# Patient Record
Sex: Male | Born: 1960 | Race: Black or African American | Hispanic: No | Marital: Married | Smoking: Never smoker
Health system: Southern US, Community
[De-identification: ages and names within clinical notes are randomized; demographics above are authoritative.]

## PROBLEM LIST (undated history)

## (undated) DIAGNOSIS — Z8673 Personal history of transient ischemic attack (TIA), and cerebral infarction without residual deficits: Secondary | ICD-10-CM

## (undated) DIAGNOSIS — D860 Sarcoidosis of lung: Secondary | ICD-10-CM

## (undated) DIAGNOSIS — Z87442 Personal history of urinary calculi: Secondary | ICD-10-CM

## (undated) DIAGNOSIS — I253 Aneurysm of heart: Secondary | ICD-10-CM

## (undated) DIAGNOSIS — I10 Essential (primary) hypertension: Secondary | ICD-10-CM

## (undated) DIAGNOSIS — I493 Ventricular premature depolarization: Secondary | ICD-10-CM

## (undated) DIAGNOSIS — I429 Cardiomyopathy, unspecified: Secondary | ICD-10-CM

## (undated) DIAGNOSIS — M1990 Unspecified osteoarthritis, unspecified site: Secondary | ICD-10-CM

## (undated) DIAGNOSIS — R1310 Dysphagia, unspecified: Secondary | ICD-10-CM

## (undated) DIAGNOSIS — D863 Sarcoidosis of skin: Secondary | ICD-10-CM

## (undated) DIAGNOSIS — Z9289 Personal history of other medical treatment: Secondary | ICD-10-CM

## (undated) DIAGNOSIS — R200 Anesthesia of skin: Secondary | ICD-10-CM

## (undated) DIAGNOSIS — Z8489 Family history of other specified conditions: Secondary | ICD-10-CM

## (undated) DIAGNOSIS — G8929 Other chronic pain: Secondary | ICD-10-CM

## (undated) HISTORY — DX: Personal history of other medical treatment: Z92.89

## (undated) HISTORY — PX: KNEE SURGERY: SHX244

## (undated) HISTORY — DX: Unspecified osteoarthritis, unspecified site: M19.90

## (undated) HISTORY — PX: WISDOM TOOTH EXTRACTION: SHX21

## (undated) HISTORY — DX: Ventricular premature depolarization: I49.3

## (undated) HISTORY — DX: Sarcoidosis of skin: D86.3

## (undated) HISTORY — DX: Personal history of transient ischemic attack (TIA), and cerebral infarction without residual deficits: Z86.73

## (undated) HISTORY — DX: Cardiomyopathy, unspecified: I42.9

## (undated) HISTORY — PX: CYSTOSCOPY W/ URETERAL STENT PLACEMENT: SHX1429

---

## 1998-09-15 ENCOUNTER — Encounter: Payer: Self-pay | Admitting: Internal Medicine

## 1998-09-15 ENCOUNTER — Ambulatory Visit (HOSPITAL_COMMUNITY): Admission: RE | Admit: 1998-09-15 | Discharge: 1998-09-15 | Payer: Self-pay | Admitting: Internal Medicine

## 1998-10-18 ENCOUNTER — Ambulatory Visit (HOSPITAL_COMMUNITY): Admission: RE | Admit: 1998-10-18 | Discharge: 1998-10-18 | Payer: Self-pay | Admitting: Internal Medicine

## 1998-10-18 ENCOUNTER — Encounter: Payer: Self-pay | Admitting: Internal Medicine

## 1999-06-08 ENCOUNTER — Emergency Department (HOSPITAL_COMMUNITY): Admission: EM | Admit: 1999-06-08 | Discharge: 1999-06-08 | Payer: Self-pay | Admitting: Emergency Medicine

## 2000-07-10 ENCOUNTER — Ambulatory Visit (HOSPITAL_BASED_OUTPATIENT_CLINIC_OR_DEPARTMENT_OTHER): Admission: RE | Admit: 2000-07-10 | Discharge: 2000-07-10 | Payer: Self-pay | Admitting: General Surgery

## 2000-07-14 ENCOUNTER — Ambulatory Visit (HOSPITAL_COMMUNITY): Admission: RE | Admit: 2000-07-14 | Discharge: 2000-07-14 | Payer: Self-pay | Admitting: General Surgery

## 2000-07-16 ENCOUNTER — Encounter: Payer: Self-pay | Admitting: Family Medicine

## 2000-07-16 ENCOUNTER — Ambulatory Visit (HOSPITAL_COMMUNITY): Admission: RE | Admit: 2000-07-16 | Discharge: 2000-07-16 | Payer: Self-pay | Admitting: Family Medicine

## 2004-08-28 ENCOUNTER — Emergency Department (HOSPITAL_COMMUNITY): Admission: EM | Admit: 2004-08-28 | Discharge: 2004-08-29 | Payer: Self-pay | Admitting: Emergency Medicine

## 2004-08-30 ENCOUNTER — Ambulatory Visit (HOSPITAL_COMMUNITY): Admission: RE | Admit: 2004-08-30 | Discharge: 2004-08-30 | Payer: Self-pay | Admitting: Urology

## 2011-11-04 ENCOUNTER — Emergency Department (HOSPITAL_COMMUNITY)
Admission: EM | Admit: 2011-11-04 | Discharge: 2011-11-04 | Disposition: A | Discharge status: Home / Self Care | Payer: Self-pay | Attending: Emergency Medicine | Admitting: Emergency Medicine

## 2011-11-04 ENCOUNTER — Encounter: Payer: Self-pay | Admitting: *Deleted

## 2011-11-04 ENCOUNTER — Emergency Department (HOSPITAL_COMMUNITY): Payer: Self-pay

## 2011-11-04 DIAGNOSIS — I10 Essential (primary) hypertension: Secondary | ICD-10-CM | POA: Insufficient documentation

## 2011-11-04 DIAGNOSIS — G8929 Other chronic pain: Secondary | ICD-10-CM | POA: Insufficient documentation

## 2011-11-04 DIAGNOSIS — M25452 Effusion, left hip: Secondary | ICD-10-CM

## 2011-11-04 DIAGNOSIS — M169 Osteoarthritis of hip, unspecified: Secondary | ICD-10-CM

## 2011-11-04 DIAGNOSIS — M25459 Effusion, unspecified hip: Secondary | ICD-10-CM | POA: Insufficient documentation

## 2011-11-04 DIAGNOSIS — M1610 Unilateral primary osteoarthritis, unspecified hip: Secondary | ICD-10-CM | POA: Insufficient documentation

## 2011-11-04 HISTORY — DX: Other chronic pain: G89.29

## 2011-11-04 HISTORY — DX: Essential (primary) hypertension: I10

## 2011-11-04 LAB — URINALYSIS, ROUTINE W REFLEX MICROSCOPIC
Glucose, UA: NEGATIVE mg/dL
Ketones, ur: NEGATIVE mg/dL
Leukocytes, UA: NEGATIVE
Nitrite: NEGATIVE
Specific Gravity, Urine: 1.019 (ref 1.005–1.030)
pH: 6.5 (ref 5.0–8.0)

## 2011-11-04 MED ORDER — PREDNISONE (PAK) 10 MG PO TABS: ORAL_TABLET | ORAL | Status: AC

## 2011-11-04 MED ORDER — OXYCODONE-ACETAMINOPHEN 5-325 MG PO TABS: 1.0000 | ORAL_TABLET | ORAL | Status: AC | PRN

## 2011-11-04 MED ORDER — NAPROXEN 500 MG PO TABS: 500.0000 mg | ORAL_TABLET | Freq: Two times a day (BID) | ORAL | Status: DC

## 2011-11-04 MED ORDER — LORAZEPAM 1 MG PO TABS
2.0000 mg | ORAL_TABLET | Freq: Once | ORAL | Status: AC
  Administered 2011-11-04: 2 mg via ORAL
  Filled 2011-11-04 (×2): qty 2

## 2011-11-04 MED ORDER — HYDROCODONE-ACETAMINOPHEN 5-325 MG PO TABS
1.0000 | ORAL_TABLET | Freq: Once | ORAL | Status: AC
  Administered 2011-11-04: 1 via ORAL
  Filled 2011-11-04: qty 1

## 2011-11-04 NOTE — ED Notes (Signed)
Family at bedside. Pt back from xray

## 2011-11-04 NOTE — ED Notes (Signed)
Patient transported to MRI 

## 2011-11-04 NOTE — ED Notes (Signed)
Patient transported to X-ray 

## 2011-11-04 NOTE — ED Provider Notes (Signed)
Medical screening examination/treatment/procedure(s) were performed by non-physician practitioner and as supervising physician I was immediately available for consultation/collaboration.   Andrew King, MD 11/04/11 1535 

## 2011-11-04 NOTE — ED Notes (Signed)
Pt c/o L leg pain, acute exacerbation of chronic condition. Pt denies recent injury or fall.

## 2011-11-04 NOTE — ED Provider Notes (Signed)
History     CSN: [id number] Arrival date & time: 11/04/2011  6:33 AM   First MD Initiated Contact with Patient 11/04/11 0716      HPI Tony Howard is a 50 y.o. male complaining of left hip pain. States in 2004 had a biopsy done where they made an incision in his left inguinal crease. States since then has had intermittent pain that begins in that area and radiates down entire leg. Describes pain as a burning tightening sensation of his entire left leg. States at times his leg will give out and caused him to fall. Also reports he will hear clicking sounds coming from his posterior thigh. States his last fall was approximately a week ago. Within the last week the pain has worsened. Denies back pain, abdominal pain, nausea, vomiting, numbness, tingling, saddle anesthesias,  perineal numbness, or incontinence. Patient is a 50 y.o. male presenting with leg pain.  Leg Pain  Incident onset: Several years. Injury mechanism: Biopsy done from left inguinal crease in 2004. The pain is present in the left hip and left leg. The quality of the pain is described as sharp and burning. The pain is severe. The pain has been intermittent since onset. Pertinent negatives include no numbness, no inability to bear weight, no loss of motion, no muscle weakness, no loss of sensation and no tingling. He reports no foreign bodies present. The symptoms are aggravated by bearing weight.    Past Medical History  Diagnosis Date  . Chronic pain   . Hypertension     Past Surgical History  Procedure Date  . Hip surgery   . Cystoscopy w/ ureteral stent placement     History reviewed. No pertinent family history.  History  Substance Use Topics  . Smoking status: Never Smoker   . Smokeless tobacco: Not on file  . Alcohol Use: No      Review of Systems  Constitutional: Negative for fever and chills.  HENT: Negative for neck pain.   Respiratory: Negative for cough and shortness of breath.   Cardiovascular:  Negative for chest pain and palpitations.  Gastrointestinal: Negative for nausea, vomiting and abdominal pain.  Genitourinary: Negative for dysuria, hematuria and flank pain.  Musculoskeletal: Negative for myalgias, back pain and gait problem.       Positive for Left hip/leg pain.  Denies saddle anesthesias, perineal numbness, bowel incontinence, urinary incontinence  Neurological: Negative for dizziness, tingling, weakness, numbness and headaches.  All other systems reviewed and are negative.    Allergies  Iodine  Home Medications  No current outpatient prescriptions on file.  BP 148/90  Pulse 76  Temp(Src) 97.8 F (36.6 C) (Oral)  Resp 20  SpO2 100%  Physical Exam  Constitutional: He is oriented to person, place, and time. He appears well-developed and well-nourished.  HENT:  Head: Normocephalic and atraumatic.  Eyes: Pupils are equal, round, and reactive to light.  Musculoskeletal:       Left hip: He exhibits tenderness. He exhibits normal range of motion, normal strength, no bony tenderness, no swelling, no crepitus, no deformity and no laceration.       Lumbar back: Normal.       Left hip: Full ROM with mild pain. Pain with palpation over inguinal crease and posterior distal thigh. Femoral, popiteal , and pedal pulses +1. No skin changes, masses, deformity or laceration.   Neurological: He is alert and oriented to person, place, and time.  Skin: Skin is warm and dry. No rash noted.  No erythema. No pallor.  Psychiatric: He has a normal mood and affect. His behavior is normal.    ED Course  Procedures    Results for orders placed during the hospital encounter of 11/04/11  URINALYSIS, ROUTINE W REFLEX MICROSCOPIC      Component Value Range   Color, Urine YELLOW  YELLOW    Appearance CLEAR  CLEAR    Specific Gravity, Urine 1.019  1.005 - 1.030    pH 6.5  5.0 - 8.0    Glucose, UA NEGATIVE  NEGATIVE (mg/dL)   Hgb urine dipstick NEGATIVE  NEGATIVE    Bilirubin Urine  NEGATIVE  NEGATIVE    Ketones, ur NEGATIVE  NEGATIVE (mg/dL)   Protein, ur NEGATIVE  NEGATIVE (mg/dL)   Urobilinogen, UA 0.2  0.0 - 1.0 (mg/dL)   Nitrite NEGATIVE  NEGATIVE    Leukocytes, UA NEGATIVE  NEGATIVE    Dg Hip Complete Left  11/04/2011  *RADIOLOGY REPORT*  Clinical Data: Fall.  Left hip pain.  LEFT HIP - COMPLETE 2+ VIEW  Comparison: None.  Findings: Prominent osteoarthritis the left hip noted with complete loss of craniocaudal articular space, subcortical sclerosis, and prominent asymmetric spurring.  In addition, there is abnormal appearing linear low signal projecting over the left femoral head, suspicious for underlying femoral head fracture or osteochondral lesion. Acetabular fracture is possible but considered less likely.  On the lateral projection, there is some mild flattening of the femoral head, although this could be from remodeling related to the severe degenerative arthropathy.  IMPRESSION:  1.  Severe and asymmetric degenerative arthropathy of the left hip, also with abnormal linear nondisplaced lucency projecting over the femoral head and acetabulum, suspicious for an underlying nondisplaced femoral head fracture.  This could be further characterized with CT or MRI if clinically warranted.  Original Report Authenticated By: Dellia Cloud, M.D.   Mr Hip Left Wo Contrast  11/04/2011  *RADIOLOGY REPORT*  Clinical Data: Fall.  Left hip pain.  MRI OF THE LEFT HIP WITHOUT CONTRAST  Technique:  Multiplanar, multisequence MR imaging was performed. No intravenous contrast was administered.  Comparison: 11/04/2011 radiographs.  Findings: The patient refused to complete more than three series. The study is diagnostic.  Severe left hip osteoarthritis is present with subchondral sclerosis and edema.  Complete loss of the superior joint space of the left hip.  Acetabular subchondral cystic changes are present.  Degenerative large effusion.  Large femoral head osteophytes are present.   The visceral pelvis appears within normal limits.  The right hip appears normal.  Gluteal musculature appears normal and symmetric bilaterally.  Pelvic rings are intact.  There is no femoral neck fracture. Partially visualized L5-S1 spondylosis.  IMPRESSION: Severe left hip osteoarthritis with large degenerative left hip effusion.  Original Report Authenticated By: Andreas Newport, M.D.     MDM   Will treat with pain medications, NSAIDS and steroids. Referral for PCP and Ortho. Discussed plan with pt who agrees with plan.          Thomasene Lot, [place] 11/04/11 1524

## 2012-03-09 ENCOUNTER — Inpatient Hospital Stay (HOSPITAL_COMMUNITY)
Admission: EM | Admit: 2012-03-09 | Discharge: 2012-03-16 | DRG: 287 | Disposition: A | Discharge status: Home / Self Care | Payer: 59 | Source: Ambulatory Visit | Attending: Internal Medicine | Admitting: Internal Medicine

## 2012-03-09 ENCOUNTER — Other Ambulatory Visit: Payer: Self-pay

## 2012-03-09 ENCOUNTER — Encounter (HOSPITAL_COMMUNITY): Payer: Self-pay | Admitting: Emergency Medicine

## 2012-03-09 DIAGNOSIS — R599 Enlarged lymph nodes, unspecified: Secondary | ICD-10-CM | POA: Diagnosis present

## 2012-03-09 DIAGNOSIS — D860 Sarcoidosis of lung: Secondary | ICD-10-CM

## 2012-03-09 DIAGNOSIS — R791 Abnormal coagulation profile: Secondary | ICD-10-CM | POA: Diagnosis present

## 2012-03-09 DIAGNOSIS — F40298 Other specified phobia: Secondary | ICD-10-CM | POA: Diagnosis present

## 2012-03-09 DIAGNOSIS — I10 Essential (primary) hypertension: Secondary | ICD-10-CM

## 2012-03-09 DIAGNOSIS — J99 Respiratory disorders in diseases classified elsewhere: Secondary | ICD-10-CM | POA: Diagnosis present

## 2012-03-09 DIAGNOSIS — R0989 Other specified symptoms and signs involving the circulatory and respiratory systems: Secondary | ICD-10-CM | POA: Diagnosis present

## 2012-03-09 DIAGNOSIS — M169 Osteoarthritis of hip, unspecified: Secondary | ICD-10-CM | POA: Diagnosis present

## 2012-03-09 DIAGNOSIS — Z8673 Personal history of transient ischemic attack (TIA), and cerebral infarction without residual deficits: Secondary | ICD-10-CM

## 2012-03-09 DIAGNOSIS — R9431 Abnormal electrocardiogram [ECG] [EKG]: Secondary | ICD-10-CM | POA: Diagnosis present

## 2012-03-09 DIAGNOSIS — D869 Sarcoidosis, unspecified: Secondary | ICD-10-CM

## 2012-03-09 DIAGNOSIS — R079 Chest pain, unspecified: Secondary | ICD-10-CM

## 2012-03-09 DIAGNOSIS — Z9119 Patient's noncompliance with other medical treatment and regimen: Secondary | ICD-10-CM

## 2012-03-09 DIAGNOSIS — M1710 Unilateral primary osteoarthritis, unspecified knee: Secondary | ICD-10-CM | POA: Diagnosis present

## 2012-03-09 DIAGNOSIS — R0609 Other forms of dyspnea: Secondary | ICD-10-CM

## 2012-03-09 DIAGNOSIS — R0789 Other chest pain: Principal | ICD-10-CM | POA: Diagnosis present

## 2012-03-09 DIAGNOSIS — Z91199 Patient's noncompliance with other medical treatment and regimen due to unspecified reason: Secondary | ICD-10-CM

## 2012-03-09 DIAGNOSIS — M1610 Unilateral primary osteoarthritis, unspecified hip: Secondary | ICD-10-CM | POA: Diagnosis present

## 2012-03-09 DIAGNOSIS — I253 Aneurysm of heart: Secondary | ICD-10-CM | POA: Diagnosis present

## 2012-03-09 DIAGNOSIS — R9439 Abnormal result of other cardiovascular function study: Secondary | ICD-10-CM | POA: Diagnosis present

## 2012-03-09 DIAGNOSIS — D696 Thrombocytopenia, unspecified: Secondary | ICD-10-CM | POA: Diagnosis present

## 2012-03-09 HISTORY — DX: Sarcoidosis of lung: D86.0

## 2012-03-09 HISTORY — DX: Unspecified osteoarthritis, unspecified site: M19.90

## 2012-03-09 NOTE — ED Notes (Signed)
Pt states his wife took his blood pressure tonight and it was elevated  Wife states she took his blood pressure tonight because he was c/o pain on his right side, swelling to his right foot and leg, swollen arm and hand on the left, shortness of breath and some chest pain  Sxs started about 3 days ago

## 2012-03-10 ENCOUNTER — Emergency Department (HOSPITAL_COMMUNITY): Payer: Self-pay

## 2012-03-10 ENCOUNTER — Inpatient Hospital Stay (HOSPITAL_COMMUNITY): Payer: Self-pay

## 2012-03-10 ENCOUNTER — Encounter (HOSPITAL_COMMUNITY): Payer: Self-pay | Admitting: Internal Medicine

## 2012-03-10 DIAGNOSIS — M7989 Other specified soft tissue disorders: Secondary | ICD-10-CM

## 2012-03-10 DIAGNOSIS — R079 Chest pain, unspecified: Secondary | ICD-10-CM | POA: Diagnosis present

## 2012-03-10 DIAGNOSIS — D869 Sarcoidosis, unspecified: Secondary | ICD-10-CM | POA: Diagnosis present

## 2012-03-10 DIAGNOSIS — Z9119 Patient's noncompliance with other medical treatment and regimen: Secondary | ICD-10-CM

## 2012-03-10 DIAGNOSIS — I10 Essential (primary) hypertension: Secondary | ICD-10-CM | POA: Diagnosis present

## 2012-03-10 LAB — POCT I-STAT, CHEM 8
BUN: 24 mg/dL — ABNORMAL HIGH (ref 6–23)
Calcium, Ion: 1.25 mmol/L (ref 1.12–1.32)
Chloride: 111 mEq/L (ref 96–112)
Creatinine, Ser: 1.1 mg/dL (ref 0.50–1.35)
Glucose, Bld: 90 mg/dL (ref 70–99)
HCT: 44 % (ref 39.0–52.0)
Hemoglobin: 15 g/dL (ref 13.0–17.0)
Potassium: 3.9 mEq/L (ref 3.5–5.1)
Sodium: 143 mEq/L (ref 135–145)
TCO2: 23 mmol/L (ref 0–100)

## 2012-03-10 LAB — HEPATIC FUNCTION PANEL
AST: 27 U/L (ref 0–37)
Albumin: 3.2 g/dL — ABNORMAL LOW (ref 3.5–5.2)
Bilirubin, Direct: <0.1 mg/dL (ref 0.0–0.3)
Total Bilirubin: 0.5 mg/dL (ref 0.3–1.2)

## 2012-03-10 LAB — URINALYSIS, ROUTINE W REFLEX MICROSCOPIC
Glucose, UA: NEGATIVE mg/dL
Leukocytes, UA: NEGATIVE
Protein, ur: NEGATIVE mg/dL
Specific Gravity, Urine: 1.026 (ref 1.005–1.030)
Urobilinogen, UA: 1 mg/dL (ref 0.0–1.0)

## 2012-03-10 LAB — CBC
HCT: 42.6 % (ref 39.0–52.0)
Hemoglobin: 14.5 g/dL (ref 13.0–17.0)
Hemoglobin: 14.7 g/dL (ref 13.0–17.0)
MCHC: 34 g/dL (ref 30.0–36.0)
MCHC: 34 g/dL (ref 30.0–36.0)
Platelets: 141 10*3/uL — ABNORMAL LOW (ref 150–400)
RBC: 4.81 MIL/uL (ref 4.22–5.81)
WBC: 5.1 10*3/uL (ref 4.0–10.5)

## 2012-03-10 LAB — CARDIAC PANEL(CRET KIN+CKTOT+MB+TROPI)
Relative Index: 2.9 — ABNORMAL HIGH (ref 0.0–2.5)
Total CK: 120 U/L (ref 7–232)
Total CK: 104 U/L (ref 7–232)
Troponin I: <0.3 ng/mL (ref <0.30)

## 2012-03-10 LAB — TSH: TSH: 1.929 u[IU]/mL (ref 0.350–4.500)

## 2012-03-10 LAB — POCT I-STAT TROPONIN I: Troponin i, poc: 0.01 ng/mL (ref 0.00–0.08)

## 2012-03-10 LAB — CREATININE, SERUM
Creatinine, Ser: 1.05 mg/dL (ref 0.50–1.35)
GFR calc non Af Amer: 81 mL/min — ABNORMAL LOW (ref >90)

## 2012-03-10 LAB — LIPASE, BLOOD: Lipase: 39 U/L (ref 11–59)

## 2012-03-10 MED ORDER — SODIUM CHLORIDE 0.9 % IJ SOLN
3.0000 mL | Freq: Two times a day (BID) | INTRAMUSCULAR | Status: DC
  Administered 2012-03-11 – 2012-03-15 (×3): 3 mL via INTRAVENOUS

## 2012-03-10 MED ORDER — PREDNISONE 50 MG PO TABS
60.0000 mg | ORAL_TABLET | Freq: Every day | ORAL | Status: AC
  Administered 2012-03-10 – 2012-03-12 (×3): 60 mg via ORAL
  Filled 2012-03-10 (×3): qty 1

## 2012-03-10 MED ORDER — METHYLPREDNISOLONE SODIUM SUCC 125 MG IJ SOLR
125.0000 mg | Freq: Once | INTRAMUSCULAR | Status: DC
  Filled 2012-03-10: qty 2

## 2012-03-10 MED ORDER — NITROGLYCERIN IN D5W 200-5 MCG/ML-% IV SOLN
INTRAVENOUS | Status: AC
  Filled 2012-03-10: qty 250

## 2012-03-10 MED ORDER — SODIUM CHLORIDE 0.9 % IV SOLN
Freq: Once | INTRAVENOUS | Status: AC
  Administered 2012-03-10: 02:00:00 via INTRAVENOUS

## 2012-03-10 MED ORDER — SODIUM CHLORIDE 0.9 % IJ SOLN
3.0000 mL | Freq: Two times a day (BID) | INTRAMUSCULAR | Status: DC
  Administered 2012-03-10 – 2012-03-16 (×10): 3 mL via INTRAVENOUS

## 2012-03-10 MED ORDER — HYDRALAZINE HCL 20 MG/ML IJ SOLN
10.0000 mg | Freq: Once | INTRAMUSCULAR | Status: AC
  Administered 2012-03-10: 10 mg via INTRAVENOUS
  Filled 2012-03-10: qty 0.5

## 2012-03-10 MED ORDER — ASPIRIN EC 325 MG PO TBEC
325.0000 mg | DELAYED_RELEASE_TABLET | Freq: Every day | ORAL | Status: DC
  Administered 2012-03-10 – 2012-03-12 (×3): 325 mg via ORAL
  Filled 2012-03-10 (×4): qty 1

## 2012-03-10 MED ORDER — ENOXAPARIN SODIUM 40 MG/0.4ML ~~LOC~~ SOLN
40.0000 mg | SUBCUTANEOUS | Status: DC
  Administered 2012-03-10 – 2012-03-16 (×5): 40 mg via SUBCUTANEOUS
  Filled 2012-03-10 (×8): qty 0.4

## 2012-03-10 MED ORDER — TECHNETIUM TO 99M ALBUMIN AGGREGATED
3.3000 | Freq: Once | INTRAVENOUS | Status: AC | PRN
  Administered 2012-03-10: 3 via INTRAVENOUS

## 2012-03-10 MED ORDER — DOCUSATE SODIUM 100 MG PO CAPS
100.0000 mg | ORAL_CAPSULE | Freq: Two times a day (BID) | ORAL | Status: DC
  Administered 2012-03-10 – 2012-03-16 (×12): 100 mg via ORAL
  Filled 2012-03-10 (×17): qty 1

## 2012-03-10 MED ORDER — ONDANSETRON HCL 4 MG/2ML IJ SOLN: 4.0000 mg | Freq: Four times a day (QID) | INTRAMUSCULAR | Status: DC | PRN

## 2012-03-10 MED ORDER — MORPHINE SULFATE 2 MG/ML IJ SOLN
2.0000 mg | INTRAMUSCULAR | Status: DC | PRN
  Administered 2012-03-10 – 2012-03-15 (×12): 2 mg via INTRAVENOUS
  Filled 2012-03-10 (×13): qty 1

## 2012-03-10 MED ORDER — ZOLPIDEM TARTRATE 10 MG PO TABS
10.0000 mg | ORAL_TABLET | Freq: Every evening | ORAL | Status: DC | PRN
  Administered 2012-03-10 – 2012-03-15 (×6): 10 mg via ORAL
  Filled 2012-03-10 (×6): qty 1

## 2012-03-10 MED ORDER — METOPROLOL TARTRATE 25 MG PO TABS
25.0000 mg | ORAL_TABLET | Freq: Two times a day (BID) | ORAL | Status: DC
  Administered 2012-03-10 – 2012-03-11 (×3): 25 mg via ORAL
  Filled 2012-03-10 (×6): qty 1

## 2012-03-10 MED ORDER — LORAZEPAM 1 MG PO TABS
1.0000 mg | ORAL_TABLET | Freq: Three times a day (TID) | ORAL | Status: DC | PRN
  Administered 2012-03-10 – 2012-03-12 (×2): 1 mg via ORAL
  Filled 2012-03-10 (×2): qty 1

## 2012-03-10 MED ORDER — SODIUM CHLORIDE 0.9 % IV SOLN
250.0000 mL | INTRAVENOUS | Status: DC | PRN
  Administered 2012-03-10: 250 mL via INTRAVENOUS

## 2012-03-10 MED ORDER — NITROGLYCERIN IN D5W 200-5 MCG/ML-% IV SOLN
2.0000 ug/min | INTRAVENOUS | Status: DC
  Administered 2012-03-10: 5 ug/min via INTRAVENOUS

## 2012-03-10 MED ORDER — ONDANSETRON HCL 4 MG PO TABS
4.0000 mg | ORAL_TABLET | Freq: Four times a day (QID) | ORAL | Status: DC | PRN
  Administered 2012-03-14 – 2012-03-15 (×2): 4 mg via ORAL
  Filled 2012-03-10 (×2): qty 1

## 2012-03-10 MED ORDER — SODIUM CHLORIDE 0.9 % IJ SOLN
3.0000 mL | INTRAMUSCULAR | Status: DC | PRN
  Administered 2012-03-12 – 2012-03-14 (×2): 3 mL via INTRAVENOUS

## 2012-03-10 NOTE — Progress Notes (Signed)
CARE MANAGE MENT UTILIZATION REVIEW NOTE 03/10/2012     Patient:  Tony Howard, Tony Howard   Account Number:  [id number]  Documented by:  Bjorn Loser Maelle Sheaffer   Per Ur Regulation Reviewed for med. necessity/level of care/duration of stay

## 2012-03-10 NOTE — H&P (Signed)
PCP: None   Chief Complaint: Chest pain and shortness of breath.  HPI: Tony Howard is an 51 y.o. male with history of known sarcoidosis diagnosed approximately 7-8 years ago, severe hypertension, noncompliant with medication for at least 2 years, presents to the emergency room with intermittent substernal chest pain for at least to 3 weeks. He had substernal chest tightness with exertion, along with chest pain at rest intermittently radiate to his neck. He has had no nausea, vomiting, epigastric pain, fever, chills, or any diaphoresis. A few years ago he was more active working in [employer], but lately, he has not been. He does not exercise regularly. He did not know his cholesterol level. His wife is a CNA, took his blood pressure, and found it was quite elevated at 190/120. 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 He does experience wheezing as well. Evaluation in emergency room included an EKG which shows ST depression and nonspecific T wave inversions over the precordial leads. He has a normal white count, hemoglobin, renal function tests, electrolytes along with negative cardiac markers. His chest x-ray shows lymphadenopathy consistent with sarcoidosis that is unchanged. Hospitalist was asked to admit him for rule out, for further evaluation and treatment, and to stabilize his hypertension.  Rewiew of Systems:  The patient denies anorexia, fever, weight loss,, vision loss, decreased hearing, hoarseness, , syncope, peripheral edema, balance deficits, hemoptysis, abdominal pain, melena, hematochezia, severe indigestion/heartburn, hematuria, incontinence, genital sores, muscle weakness, suspicious skin lesions, transient blindness, difficulty walking, depression, unusual weight change, abnormal bleeding, enlarged lymph nodes, angioedema, and breast masses.   Past Medical History  Diagnosis Date  . Chronic pain   . Hypertension   . Osteoarthritis   . Sarcoidosis of lung      Past Surgical History  Procedure Date  . Hip surgery   . Cystoscopy w/ ureteral stent placement     Medications:  HOME MEDS: Prior to Admission medications   Medication Sig Start Date End Date Taking? Authorizing Provider  naproxen (NAPROSYN) 500 MG tablet Take 1 tablet (500 mg total) by mouth 2 (two) times daily. 11/04/11 11/03/12  Thomasene Lot, PA-C     Allergies:  Allergies  Allergen Reactions  . Naprosyn (Naproxen) Hives  . Iodine Hives and Rash    shellfish    Social History:   reports that he has never smoked. He does not have any smokeless tobacco history on file. He reports that he does not drink alcohol or use illicit drugs.  Family History: Family History  Problem Relation Age of Onset  . Cancer Mother   . Multiple sclerosis Sister      Physical Exam: Filed Vitals:   03/10/12 0045 03/10/12 0416 03/10/12 0421 03/10/12 0525  BP: 172/102 170/119 160/97 160/109  Pulse: 55 57    Temp:      TempSrc:      Resp: 13     SpO2: 100% 98%     Blood pressure 160/109, pulse 57, temperature 98 F (36.7 C), temperature source Oral, resp. rate 13, SpO2 98.00%.  GEN:  Pleasant  person lying in the stretcher in no acute distress; cooperative with exam PSYCH:  alert and oriented x4; does not appear anxious or depressed; affect is appropriate. HEENT: Mucous membranes pink and anicteric; PERRLA; EOM intact; no cervical lymphadenopathy nor thyromegaly or carotid bruit; no JVD; Breasts:: Not examined CHEST WALL: No tenderness CHEST: Normal respiration, he does have expiratory wheezes bilaterally HEART: Regular rate and rhythm; no murmurs rubs or  gallops BACK: No kyphosis or scoliosis; no CVA tenderness ABDOMEN: Obese, soft non-tender; no masses, no organomegaly, normal abdominal bowel sounds; no pannus; no intertriginous candida. Rectal Exam: Not done EXTREMITIES: No bone or joint deformity; age-appropriate arthropathy of the hands and knees; he has 1+ bilateral  pitting edema; no ulcerations. Genitalia: not examined PULSES: 2+ and symmetric SKIN: Normal hydration no rash or ulceration. He has skin patches consistent with sarcoid.  CNS: Cranial nerves 2-12 grossly intact no focal lateralizing neurologic deficit   Labs & Imaging Results for orders placed during the hospital encounter of 03/09/12 (from the past 48 hour(s))  CBC     Status: Normal   Collection Time   03/10/12 12:05 AM      Component Value Range Comment   WBC 5.1  4.0 - 10.5 (K/uL)    RBC 4.77  4.22 - 5.81 (MIL/uL)    Hemoglobin 14.5  13.0 - 17.0 (g/dL)    HCT 40.9  81.1 - 91.4 (%)    MCV 89.3  78.0 - 100.0 (fL)    MCH 30.4  26.0 - 34.0 (pg)    MCHC 34.0  30.0 - 36.0 (g/dL)    RDW 78.2  95.6 - 21.3 (%)    Platelets 155  150 - 400 (K/uL)   POCT I-STAT TROPONIN I     Status: Normal   Collection Time   03/10/12  1:02 AM      Component Value Range Comment   Troponin i, poc 0.01  0.00 - 0.08 (ng/mL)    Comment 3            POCT I-STAT, CHEM 8     Status: Abnormal   Collection Time   03/10/12  1:03 AM      Component Value Range Comment   Sodium 143  135 - 145 (mEq/L)    Potassium 3.9  3.5 - 5.1 (mEq/L)    Chloride 111  96 - 112 (mEq/L)    BUN 24 (*) 6 - 23 (mg/dL)    Creatinine, Ser 0.86  0.50 - 1.35 (mg/dL)    Glucose, Bld 90  70 - 99 (mg/dL)    Calcium, Ion 5.78  1.12 - 1.32 (mmol/L)    TCO2 23  0 - 100 (mmol/L)    Hemoglobin 15.0  13.0 - 17.0 (g/dL)    HCT 46.9  62.9 - 52.8 (%)   URINALYSIS, ROUTINE W REFLEX MICROSCOPIC     Status: Normal   Collection Time   03/10/12  1:39 AM      Component Value Range Comment   Color, Urine YELLOW  YELLOW     APPearance CLEAR  CLEAR     Specific Gravity, Urine 1.026  1.005 - 1.030     pH 6.0  5.0 - 8.0     Glucose, UA NEGATIVE  NEGATIVE (mg/dL)    Hgb urine dipstick NEGATIVE  NEGATIVE     Bilirubin Urine NEGATIVE  NEGATIVE     Ketones, ur NEGATIVE  NEGATIVE (mg/dL)    Protein, ur NEGATIVE  NEGATIVE (mg/dL)    Urobilinogen, UA 1.0  0.0  - 1.0 (mg/dL)    Nitrite NEGATIVE  NEGATIVE     Leukocytes, UA NEGATIVE  NEGATIVE  MICROSCOPIC NOT DONE ON URINES WITH NEGATIVE PROTEIN, BLOOD, LEUKOCYTES, NITRITE, OR GLUCOSE <1000 mg/dL.   Ct Head Wo Contrast  03/10/2012  *RADIOLOGY REPORT*  Clinical Data: Elevated blood pressure.  Extremity swelling.  Upper neck pain, worse on the right.  CT HEAD  WITHOUT CONTRAST  Technique:  Contiguous axial images were obtained from the base of the skull through the vertex without contrast.  Comparison: None.  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  No mass effect or midline shift.  No ventricular dilatation.  No abnormal extra-axial fluid collections.  Gray-white matter junctions are distinct.  Basal cisterns are not effaced.  No evidence of acute intracranial hemorrhage.  No depressed skull fractures.  Visualized paranasal sinuses and mastoid air cells are not opacified.  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  No acute intracranial abnormalities demonstrated.  Original Report Authenticated By: Marlon Pel, M.D.   Dg Chest Port 1 View  03/10/2012  *RADIOLOGY REPORT*  Clinical Data: Shortness of breath.  History of sarcoidosis.  PORTABLE CHEST - 1 VIEW  Comparison: 08/29/2004  Findings: The shallow inspiration.  Heart size and pulmonary vascularity are normal for technique.  No focal airspace consolidation in the lungs.  No blunting of costophrenic angles. Mild prominence of the right hilar and right paratracheal structures consistent with lymphadenopathy.  This is stable since the previous study, given differences in technique.  No pneumothorax.  IMPRESSION: Probable right hilar and mediastinal lymphadenopathy compatible with sarcoidosis.  No significant change since previous study.  No evidence  of active infiltration.  Original Report Authenticated By: Marlon Pel, M.D.      Assessment Present on Admission:  .Chest pain on exertion .DOE (dyspnea on exertion) .Sarcoidosis .HTN (hypertension)   PLAN: Will admit him to the step down unit, start him on IV nitroglycerin and titrate to a reasonable blood pressure (120/70). I will also start him on low-dose beta blocker, and full dose aspirin. We'll get an echo to evaluate his heart size and wall motion. He did describe symptoms suggestive of angina, and after stabilization, ischemic workup is warranted. With respect to his sarcoidosis, he does have some wheezes, and although his chest x-ray did not shows any changes, I think his wheezes is from the sarcoidosis. I will start him on prednisone at 60 mg per day. He is stable, full code, and will be admitted to triad hospitalist service. I spent quite some time encouraging him to be compliant with medication, followup with any recommended diagnostic tests, and followup with his primary care physician as well. He will eventually need a polysomnogram as well, but it can be done as an outpatient.   Other plans as per orders.    Tony Howard 03/10/2012, 5:56 AM

## 2012-03-10 NOTE — Progress Notes (Signed)
  Echocardiogram 2D Echocardiogram has been performed.  Tony Howard [facility] 03/10/2012, 8:30 AM

## 2012-03-10 NOTE — Progress Notes (Signed)
Patient seen and examined D-dimer elevated therefore we will rule out a PE with VQ scan Exercise Myoview in the morning to be read by Dr. Maisie Fus wall N.p.o. past midnight Doppler and VQ scan pending at this time  Discontinue nitroglycerin drip and transfer to telemetry

## 2012-03-10 NOTE — Progress Notes (Signed)
*  PRELIMINARY RESULTS* Vascular Ultrasound Lower extremity venous duplex has been completed.  Preliminary findings: Bilaterally no evidence of DVT or baker's cyst.  Farrel Demark RDMS 03/10/2012, 1:21 PM

## 2012-03-10 NOTE — ED Provider Notes (Signed)
History     CSN: [id number]  Arrival date & time 03/09/12  2329   First MD Initiated Contact with Patient 03/10/12 0022      Chief Complaint  Patient presents with  . Hypertension    (Consider location/radiation/quality/duration/timing/severity/associated sxs/prior treatment) HPI Comments: Patient with known hypertension.  Has not taken antihypertensives in greater than one year.  Reports now that for the past 3, days.  She's had right sided weakness and swelling, headache, shortness of breath with chest pain, early safety he is unable to sleep flat for more than one hour.  He has extreme dyspnea on exertion.  Cannot climb a flight of steps without stopping several times.  He does have a history of sarcoid with multiple lesions on his upper extremities, back, chest, and face.  His wife took his blood pressure at home tonight and it was quite elevated.  He states the last time he took antihypertensive "they" gave him the wrong medicine, and had a reaction,  but he does not know what that medication was  Patient is a 51 y.o. male presenting with hypertension. The history is provided by the patient and the spouse.  Hypertension This is a new problem. The current episode started in the past 7 days. The problem occurs constantly. The problem has been gradually worsening. Associated symptoms include chest pain, diaphoresis, joint swelling and weakness. Pertinent negatives include no abdominal pain, anorexia, arthralgias, change in bowel habit, chills, coughing, fever, headaches, myalgias, nausea or numbness. The symptoms are aggravated by nothing. He has tried nothing for the symptoms. The treatment provided no relief.    Past Medical History  Diagnosis Date  . Chronic pain   . Hypertension   . Osteoarthritis   . Sarcoidosis of lung     Past Surgical History  Procedure Date  . Hip surgery   . Cystoscopy w/ ureteral stent placement     Family History  Problem Relation Age of Onset  .  Cancer Mother   . Multiple sclerosis Sister     History  Substance Use Topics  . Smoking status: Never Smoker   . Smokeless tobacco: Not on file  . Alcohol Use: No      Review of Systems  Constitutional: Positive for diaphoresis. Negative for fever, chills and unexpected weight change.  HENT: Negative for neck stiffness.   Eyes: Negative for visual disturbance.  Respiratory: Positive for chest tightness and shortness of breath. Negative for cough, wheezing and stridor.   Cardiovascular: Positive for chest pain and leg swelling. Negative for palpitations.  Gastrointestinal: Positive for abdominal distention. Negative for nausea, abdominal pain, diarrhea, constipation, anorexia and change in bowel habit.  Genitourinary: Negative for dysuria and decreased urine volume.  Musculoskeletal: Positive for joint swelling. Negative for myalgias and arthralgias.  Skin: Positive for pallor.  Neurological: Positive for dizziness and weakness. Negative for numbness and headaches.    Allergies  Naprosyn and Iodine  Home Medications   Current Outpatient Rx  Name Route Sig Dispense Refill  . NAPROXEN 500 MG PO TABS Oral Take 1 tablet (500 mg total) by mouth 2 (two) times daily. 60 tablet 0    BP 182/120  Pulse 65  Temp(Src) 98 F (36.7 C) (Oral)  SpO2 99%  Physical Exam  ED Course  Procedures (including critical care time)   Labs Reviewed  CBC  URINALYSIS, ROUTINE W REFLEX MICROSCOPIC   No results found.   No diagnosis found.    MDM  Will evaluate for CVA, cardiac  disease, lung disease         Arman Filter, NP 03/10/12 2000

## 2012-03-11 ENCOUNTER — Other Ambulatory Visit: Payer: Self-pay

## 2012-03-11 ENCOUNTER — Inpatient Hospital Stay (HOSPITAL_COMMUNITY)
Admission: RE | Admit: 2012-03-11 | Discharge: 2012-03-11 | Disposition: A | Discharge status: Home / Self Care | Payer: Self-pay | Source: Ambulatory Visit | Attending: Cardiology | Admitting: Cardiology

## 2012-03-11 DIAGNOSIS — R079 Chest pain, unspecified: Secondary | ICD-10-CM

## 2012-03-11 LAB — CBC
HCT: 42.7 % (ref 39.0–52.0)
Hemoglobin: 14.5 g/dL (ref 13.0–17.0)
RBC: 4.78 MIL/uL (ref 4.22–5.81)

## 2012-03-11 LAB — CARDIAC PANEL(CRET KIN+CKTOT+MB+TROPI)
Relative Index: INVALID (ref 0.0–2.5)
Total CK: 98 U/L (ref 7–232)

## 2012-03-11 LAB — BASIC METABOLIC PANEL
Chloride: 107 mEq/L (ref 96–112)
GFR calc Af Amer: >90 mL/min (ref >90)
GFR calc non Af Amer: 82 mL/min — ABNORMAL LOW (ref >90)
Glucose, Bld: 88 mg/dL (ref 70–99)
Potassium: 3.7 mEq/L (ref 3.5–5.1)
Sodium: 137 mEq/L (ref 135–145)

## 2012-03-11 MED ORDER — HYDRALAZINE HCL 20 MG/ML IJ SOLN
10.0000 mg | Freq: Four times a day (QID) | INTRAMUSCULAR | Status: DC | PRN
  Administered 2012-03-11 – 2012-03-12 (×5): 10 mg via INTRAVENOUS
  Filled 2012-03-11 (×5): qty 1

## 2012-03-11 MED ORDER — HYDROCHLOROTHIAZIDE 25 MG PO TABS
25.0000 mg | ORAL_TABLET | Freq: Every day | ORAL | Status: DC
  Administered 2012-03-11 – 2012-03-16 (×6): 25 mg via ORAL
  Filled 2012-03-11 (×7): qty 1

## 2012-03-11 MED ORDER — ACETAMINOPHEN 325 MG PO TABS
650.0000 mg | ORAL_TABLET | Freq: Four times a day (QID) | ORAL | Status: DC | PRN
  Administered 2012-03-11 (×2): 650 mg via ORAL
  Filled 2012-03-11 (×4): qty 2

## 2012-03-11 MED ORDER — METOPROLOL TARTRATE 25 MG PO TABS
25.0000 mg | ORAL_TABLET | Freq: Once | ORAL | Status: AC
  Administered 2012-03-11: 25 mg via ORAL
  Filled 2012-03-11: qty 1

## 2012-03-11 MED ORDER — REGADENOSON 0.4 MG/5ML IV SOLN
0.4000 mg | Freq: Once | INTRAVENOUS | Status: AC
  Administered 2012-03-11: 0.4 mg via INTRAVENOUS

## 2012-03-11 MED ORDER — METOPROLOL TARTRATE 50 MG PO TABS
50.0000 mg | ORAL_TABLET | Freq: Two times a day (BID) | ORAL | Status: DC
  Administered 2012-03-11 – 2012-03-12 (×2): 50 mg via ORAL
  Filled 2012-03-11 (×5): qty 1

## 2012-03-11 NOTE — Plan of Care (Signed)
Problem: Phase I Progression Outcomes Goal: Hemodynamically stable Outcome: Progressing BP still is elevated at times.  MD adjusting medications.

## 2012-03-11 NOTE — Progress Notes (Signed)
Subjective:   Chart reviewed. Patient returned from stress test at [facility]. Denies chest pain or dyspnea or palpitations. Has not seen a PCP or taken blood pressure medications for approximately 2 years.  Objective  Vital signs in last 24 hours: Filed Vitals:   03/11/12 1215 03/11/12 1219 03/11/12 1246 03/11/12 1336  BP: 168/113 168/113 156/99 156/98  Pulse: 63   68  Temp:    97.3 F (36.3 C)  TempSrc:    Oral  Resp: 18   18  Height:      Weight:      SpO2: 100%   100%   Weight change: -1.658 kg (-3 lb 10.5 oz)  Intake/Output Summary (Last 24 hours) at 03/11/12 1552 Last data filed at 03/11/12 1507  Gross per 24 hour  Intake    713 ml  Output      0 ml  Net    713 ml    Physical Exam:  General Exam: Comfortable.  Respiratory System: Clear. No increased work of breathing.  Cardiovascular System: First and second heart sounds heard. Regular rate and rhythm. No JVD/murmurs. Telemetry shows normal sinus rhythm. Gastrointestinal System: Abdomen is non distended, soft and normal bowel sounds heard.  Central Nervous System: Alert and oriented. No focal neurological deficits. Extremities: Symmetrical 5 x 5 power.  Labs:  Basic Metabolic Panel:  Lab 03/11/12 1610 03/10/12 0734 03/10/12 0103  NA 137 -- 143  K 3.7 -- 3.9  CL 107 -- 111  CO2 25 -- --  GLUCOSE 88 -- 90  BUN 23 -- 24*  CREATININE 1.04 1.05 1.10  CALCIUM 9.3 -- --  ALB -- -- --  PHOS -- -- --   Liver Function Tests:  Lab 03/10/12 0730  AST 27  ALT 40  ALKPHOS 82  BILITOT 0.5  PROT 6.8  ALBUMIN 3.2*    Lab 03/10/12 0730  LIPASE 39  AMYLASE --   No results found for this basename: AMMONIA:3 in the last 168 hours CBC:  Lab 03/11/12 0440 03/10/12 0734 03/10/12 0103 03/10/12 0005  WBC 10.3 5.3 -- 5.1  NEUTROABS -- -- -- --  HGB 14.5 14.7 15.0 --  HCT 42.7 43.2 44.0 --  MCV 89.3 89.8 -- 89.3  PLT 146* 141* -- 155   Cardiac Enzymes:  Lab 03/10/12 2310 03/10/12 1523 03/10/12 0734    CKTOTAL 98 104 120  CKMB 2.8 3.0 3.3  CKMBINDEX -- -- --  TROPONINI <0.30 <0.30 <0.30   CBG: No results found for this basename: GLUCAP:5 in the last 168 hours  Iron Studies: No results found for this basename: IRON,TIBC,TRANSFERRIN,FERRITIN in the last 72 hours Studies/Results: Ct Head Wo Contrast  03/10/2012  *RADIOLOGY REPORT*  Clinical Data: Elevated blood pressure.  Extremity swelling.  Upper neck pain, worse on the right.  CT HEAD WITHOUT CONTRAST  Technique:  Contiguous axial images were obtained from the base of the skull through the vertex without contrast.  Comparison: None.  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  No mass effect or midline shift.  No ventricular dilatation.  No abnormal extra-axial fluid collections.  Gray-white matter junctions are distinct.  Basal cisterns are not effaced.  No evidence of acute intracranial hemorrhage.  No depressed skull fractures.  Visualized paranasal sinuses and mastoid air cells are not opacified.  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  No acute intracranial abnormalities demonstrated.  Original Report Authenticated By: Marlon Pel, M.D.   Nm Pulmonary Perfusion  03/10/2012  *RADIOLOGY REPORT*  Clinical Data: Iodine allergy.  Evaluate for pulmonary embolus. Chest pain, shortness of breath.  NM PULMONARY PERFUSION PARTICULATE  Radiopharmaceutical: CURIE MAA TECHNETIUM TO 50M ALBUMIN AGGREGATED  Comparison: Chest x-ray 03/10/2012  Findings: There are no photopenic defects on the perfusion study to suggest pulmonary embolus.  IMPRESSION: Normal perfusion study.  No evidence of pulmonary embolus.  Original Report Authenticated By: Cyndie Chime, M.D.   Dg Chest Port 1 View  03/10/2012  *RADIOLOGY REPORT*  Clinical Data:  Shortness of breath.  History of sarcoidosis.  PORTABLE CHEST - 1 VIEW  Comparison: 08/29/2004  Findings: The shallow inspiration.  Heart size and pulmonary vascularity are normal for technique.  No focal airspace consolidation in the lungs.  No blunting of costophrenic angles. Mild prominence of the right hilar and right paratracheal structures consistent with lymphadenopathy.  This is stable since the previous study, given differences in technique.  No pneumothorax.  IMPRESSION: Probable right hilar and mediastinal lymphadenopathy compatible with sarcoidosis.  No significant change since previous study.  No evidence of active infiltration.  Original Report Authenticated By: Marlon Pel, M.D.   Medications:      . aspirin EC  325 mg Oral Daily  . docusate sodium  100 mg Oral BID  . enoxaparin  40 mg Subcutaneous Q24H  . hydrochlorothiazide  25 mg Oral Daily  . metoprolol tartrate  50 mg Oral BID  . metoprolol tartrate  25 mg Oral Once  . nitroGLYCERIN      . predniSONE  60 mg Oral QAC breakfast  . regadenoson  0.4 mg Intravenous Once  . sodium chloride  3 mL Intravenous Q12H  . sodium chloride  3 mL Intravenous Q12H  . DISCONTD: metoprolol tartrate  25 mg Oral BID    I  have reviewed scheduled and prn medications.     Problem/Plan: Principal Problem:  *Chest pain on exertion Active Problems:  DOE (dyspnea on exertion)  Sarcoidosis  HTN (hypertension)  Noncompliance  1. Chest pain: ruled out for acute coronary syndrome by negative cardiac enzymes. VQ scan and lower extremity venous Dopplers were negative. Patient is undergoing today stress test which will be completed tomorrow secondary to his size. 2. Uncontrolled hypertension: Increase metoprolol to 50 twice a day and added HCTZ 25 mg daily. Monitor. 3. Medication noncompliance: Counseled patient and his wife who was at the bedside regarding importance of compliance with medications and M.D. followups. Will request case  manager to assist with finding a PCP that he can followup with her as outpatient. 4. Dyspnea on exertion: Unclear etiology. As indicated above VQ scan was negative for PE and chest x-ray showed mediastinal adenopathy which is unchanged. Will need outpatient pulmonology consultation for further evaluation and management. He may need pulmonary function tests and sleep study. Complete 3 days prednisone that was started on admission. Will not be discharged on steroids. 5. Sarcoidosis: 6. Mild thrombocytopenia: Stable. Follow CBC tomorrow.  Disposition: Possibly discharge home tomorrow if stress test is negative. Discussed at length with patient spouse who was at the bedside and updated care.  Tony Howard 03/11/2012,3:52 PM  LOS: 2 days

## 2012-03-11 NOTE — ED Provider Notes (Signed)
Medical screening examination/treatment/procedure(s) were performed by non-physician practitioner and as supervising physician I was immediately available for consultation/collaboration.  Jizelle Conkey, MD 03/11/12 2132 

## 2012-03-11 NOTE — Progress Notes (Signed)
Pt with no chest pain this am. Echo yest with preserved EF, no WMA. VQ scan negative for PE. CE negative for MI. OK for stress.   With Lexiscan, pt had SOB, HR increased by 30 bpm, felt hot and cold. ECG with SR/ST and PVCs increased in frequency from pre-test ECG. Some PVCs multi-focal pairs.  BP high, pt had some Rx this am, will give other am meds, if possible. Late Sx of HA and feet tingling, Rx with Tylenol.   Images pending.

## 2012-03-12 ENCOUNTER — Inpatient Hospital Stay (HOSPITAL_COMMUNITY)
Admission: RE | Admit: 2012-03-12 | Discharge: 2012-03-12 | Disposition: A | Discharge status: Home / Self Care | Payer: Self-pay | Source: Ambulatory Visit | Attending: Cardiology | Admitting: Cardiology

## 2012-03-12 ENCOUNTER — Ambulatory Visit (HOSPITAL_COMMUNITY)
Admission: RE | Admit: 2012-03-12 | Discharge: 2012-03-12 | Disposition: A | Discharge status: Home / Self Care | Payer: Self-pay | Source: Ambulatory Visit | Attending: Cardiology | Admitting: Cardiology

## 2012-03-12 DIAGNOSIS — R079 Chest pain, unspecified: Secondary | ICD-10-CM

## 2012-03-12 DIAGNOSIS — R9430 Abnormal result of cardiovascular function study, unspecified: Secondary | ICD-10-CM

## 2012-03-12 LAB — CBC
HCT: 43.4 % (ref 39.0–52.0)
MCH: 30.3 pg (ref 26.0–34.0)
MCV: 87.7 fL (ref 78.0–100.0)
RBC: 4.95 MIL/uL (ref 4.22–5.81)
WBC: 10.8 10*3/uL — ABNORMAL HIGH (ref 4.0–10.5)

## 2012-03-12 MED ORDER — LISINOPRIL 20 MG PO TABS
20.0000 mg | ORAL_TABLET | Freq: Every day | ORAL | Status: DC
  Administered 2012-03-12: 10 mg via ORAL
  Administered 2012-03-13: 20 mg via ORAL
  Filled 2012-03-12 (×2): qty 1

## 2012-03-12 MED ORDER — PREDNISONE 50 MG PO TABS
60.0000 mg | ORAL_TABLET | ORAL | Status: AC
  Administered 2012-03-13: 60 mg via ORAL
  Filled 2012-03-12 (×2): qty 1

## 2012-03-12 MED ORDER — ASPIRIN 81 MG PO CHEW
324.0000 mg | CHEWABLE_TABLET | ORAL | Status: AC
  Administered 2012-03-13: 324 mg via ORAL
  Filled 2012-03-12: qty 4

## 2012-03-12 MED ORDER — TECHNETIUM TC 99M TETROFOSMIN IV KIT
30.0000 | PACK | Freq: Once | INTRAVENOUS | Status: AC | PRN
  Administered 2012-03-12: 30 via INTRAVENOUS

## 2012-03-12 MED ORDER — LISINOPRIL 10 MG PO TABS
10.0000 mg | ORAL_TABLET | Freq: Every day | ORAL | Status: DC
  Filled 2012-03-12 (×3): qty 1

## 2012-03-12 MED ORDER — CARVEDILOL 12.5 MG PO TABS
12.5000 mg | ORAL_TABLET | Freq: Two times a day (BID) | ORAL | Status: DC
  Administered 2012-03-12 – 2012-03-16 (×9): 12.5 mg via ORAL
  Filled 2012-03-12 (×11): qty 1

## 2012-03-12 MED ORDER — SODIUM CHLORIDE 0.9 % IJ SOLN: 3.0000 mL | INTRAMUSCULAR | Status: DC | PRN

## 2012-03-12 MED ORDER — LISINOPRIL 10 MG PO TABS
10.0000 mg | ORAL_TABLET | Freq: Once | ORAL | Status: AC
  Administered 2012-03-12: 10 mg via ORAL
  Filled 2012-03-12: qty 1

## 2012-03-12 MED ORDER — DIPHENHYDRAMINE HCL 50 MG/ML IJ SOLN
25.0000 mg | INTRAMUSCULAR | Status: AC
  Administered 2012-03-13: 25 mg via INTRAVENOUS
  Filled 2012-03-12: qty 1

## 2012-03-12 MED ORDER — PREDNISONE 50 MG PO TABS
60.0000 mg | ORAL_TABLET | ORAL | Status: AC
  Administered 2012-03-12: 60 mg via ORAL
  Filled 2012-03-12: qty 1

## 2012-03-12 MED ORDER — FAMOTIDINE IN NACL 20-0.9 MG/50ML-% IV SOLN
20.0000 mg | INTRAVENOUS | Status: AC
  Administered 2012-03-13: 20 mg via INTRAVENOUS
  Filled 2012-03-12: qty 50

## 2012-03-12 MED ORDER — ASPIRIN EC 325 MG PO TBEC
325.0000 mg | DELAYED_RELEASE_TABLET | Freq: Every day | ORAL | Status: DC
  Administered 2012-03-14 – 2012-03-15 (×2): 325 mg via ORAL
  Filled 2012-03-12 (×3): qty 1

## 2012-03-12 MED ORDER — SODIUM CHLORIDE 0.9 % IJ SOLN
3.0000 mL | Freq: Two times a day (BID) | INTRAMUSCULAR | Status: DC
  Administered 2012-03-12: 3 mL via INTRAVENOUS

## 2012-03-12 MED ORDER — SODIUM CHLORIDE 0.9 % IV SOLN: 250.0000 mL | INTRAVENOUS | Status: DC | PRN

## 2012-03-12 NOTE — Consult Note (Signed)
CARDIOLOGY CONSULT NOTE  Patient ID: Tony Howard MRN: [id number] DOB/AGE: August 22, 1961 50 y.o.  Admit date: 03/09/2012 Primary Physician: None Primary Cardiologist: None Reason for Consultation: Chest pain, abnormal myoview  HPI:  51 yo with history of pulmonary and cutaneous sarcoid as well as uncontrolled HTN presented to ER at [facility] with chest pain and elevated BP.  Patient has not been on BP meds for over a year now.  Over the last 6 months, he has had episodes of atypical chest pain that occurs, he says, whenever his hip and knee are hurting.  The chest pain does not seem to be exertional.  He has significant exertional dyspnea, getting short of breath with any moderate activity such as mowing the grass or walking up steps.  He has been diagnosed with pulmonary and cutaneous sarcoidosis.  He used to see Dr. Alwyn Ren for sarcoidosis years ago but has not had followup in a number of years.  Main complaint recently has been pain in his right hip and knee attributed to OA.   Prior to admission, he felt throbbing central chest pain that was quite severe. His wife, who is a CNA, checked his BP and found it to be 190/120.  She took him to the ER.  He was noted to be wheezing when he was initially admitted but this has resolved.  He has been started on BP meds.  V/Q scan showed no evidence for PE.  CXR was consistent with sarcoidosis with hilar lymphadenopathy.  EF was normal on echo.   He ruled out for MI with negative cardiac enzymes.  Myoview was done, showing EF 47% with evidence for anterior ischemia.  ECG showed anterior and inferior T wave inversions.  Cardiology was consulted.   Review of systems complete and found to be negative unless listed above in HPI  Past Medical History: 1. Sarcoidosis: Pulmonary and cuteneous, diagnosed around 2005.  Has not seen a specialist for this in years.  2. HTN: Uncontrolled.  3. OA 4. 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  5. Contrast allergy  Family History  Problem Relation Age of Onset  . Cancer Mother   . Multiple sclerosis Sister     History   Social History  . Marital Status: Married    Spouse Name: N/A    Number of Children: N/A  . Years of Education: N/A   Occupational History  . Unemployed, used to work in [employer].    Social History Main Topics  . Smoking status: Never Smoker   . Smokeless tobacco: Never Used  . Alcohol Use: No  . Drug Use: No  . Sexually Active: Yes    Birth Control/ Protection: None   Other Topics Concern  . Not on file   Social History Narrative  . No narrative on file     Prescriptions prior to admission  Medication Sig Dispense Refill  . naproxen (NAPROSYN) 500 MG tablet Take 1 tablet (500 mg total) by mouth 2 (two) times daily.  60 tablet  0   Current meds:     . aspirin EC  325 mg Oral Daily  . carvedilol  12.5 mg Oral BID WC  . docusate sodium  100 mg Oral BID  . enoxaparin  40 mg Subcutaneous Q24H  . hydrochlorothiazide  25 mg Oral Daily  . lisinopril  10 mg Oral Once  . lisinopril  20 mg Oral Daily  . predniSONE  60  mg Oral QAC breakfast  . sodium chloride  3 mL Intravenous Q12H  . sodium chloride  3 mL Intravenous Q12H  . DISCONTD: lisinopril  10 mg Oral Daily  . DISCONTD: metoprolol tartrate  50 mg Oral BID    Physical exam Blood pressure 162/109, pulse 70, temperature 97.7 F (36.5 C), temperature source Oral, resp. rate 20, height 6\' 5"  (1.956 m), weight 300 lb 12.8 oz (136.442 kg), SpO2 95.00%. General: NAD Neck: No JVD, no thyromegaly or thyroid nodule.  Lungs: Clear to auscultation bilaterally with normal respiratory effort. CV: Nondisplaced PMI.  Heart regular S1/S2, +S4, no murmur.  No peripheral edema.  No carotid bruit.  Normal pedal pulses.  Abdomen: Soft, nontender, no hepatosplenomegaly, no distention.  Skin: Intact without lesions or rashes.  Neurologic: Alert and oriented x 3.  Psych: Normal  affect. Extremities: No clubbing or cyanosis.  HEENT: Normal.   Labs:   Lab Results  Component Value Date   WBC 10.8* 03/12/2012   HGB 15.0 03/12/2012   HCT 43.4 03/12/2012   MCV 87.7 03/12/2012   PLT 173 03/12/2012    Lab 03/11/12 0440 03/10/12 0730  NA 137 --  K 3.7 --  CL 107 --  CO2 25 --  BUN 23 --  CREATININE 1.04 --  CALCIUM 9.3 --  PROT -- 6.8  BILITOT -- 0.5  ALKPHOS -- 82  ALT -- 40  AST -- 27  GLUCOSE 88 --   Lab Results  Component Value Date   CKTOTAL 98 03/10/2012   CKMB 2.8 03/10/2012   TROPONINI <0.30 03/10/2012   Radiology: CXR with hilar and mediastinal lymphadenopathy EKG:NSR, anterior and inferior T wave inversions  ASSESSMENT AND PLAN:   51 yo with history of sarcoidosis and HTN presented with hypertensive urgency and chest pain.   1. Chest pain: Atypical chest pain episodes x 6 months, worse prior to admission.  Patient ruled out for MI.  He has diffuse T wave inversions on ECG.  Myoview suggested anterior ischemia.  Given abnormal myoview and ECG, I think that he needs left heart cath to rule out coronary disease.  Alternatively, the chest pain could be related to sarcoidosis.  He was wheezing some apparently at the time of admission (not now).  It also is possible that cardiac involvement by sarcoid could create a perfusion abnormality, but would expect a fixed rather than reversible perfusion defect. If he does not have significant disease by cath tomorrow, would consider cardiac MRI to assess for cardiac sarcoidosis.  I suspect that the echo is more accurate for EF (47% by myoview, 65-70% by echo).  Continue ASA.  Will check lipids in am.  2. Sarcoidosis: Mediastinal and hilar LAN on CXR.  Needs to establish pulmonology followup.  If coronary workup is negative, would consider cardiac MRI to look for cardiac involvement by sarcoid.  3. HTN: BP still high.  Change metoprolol to Coreg 12.5 mg bid.  Increase lisinopril to 20 mg daily and give extra dose today.  Next  step would be amlodipine.  4. Contrast allergy: Will premedicate before cath tomorrow.   Signed: [name] 03/12/2012, 5:03 PM

## 2012-03-12 NOTE — Progress Notes (Signed)
Subjective:   Patient was seen this morning after he returned from his second day of stress test at [facility]. He was tired and complained of mild headache but denied chest pain. He felt nauseous but no vomiting. No abdominal pain.  Objective  Vital signs in last 24 hours: Filed Vitals:   03/12/12 0920 03/12/12 0930 03/12/12 0940 03/12/12 1500  BP: 157/103 160/103 157/72 162/109  Pulse: 68 66 66 70  Temp:    97.7 F (36.5 C)  TempSrc:    Oral  Resp:    20  Height:      Weight:      SpO2:    95%   Weight change:  No intake or output data in the 24 hours ending 03/12/12 1659  Physical Exam:  General Exam: Comfortable.  Respiratory System: Clear. No increased work of breathing.  Cardiovascular System: First and second heart sounds heard. Regular rate and rhythm. No JVD/murmurs. Telemetry shows normal sinus rhythm. Gastrointestinal System: Abdomen is non distended, soft and normal bowel sounds heard.  Central Nervous System: Alert and oriented. No focal neurological deficits. Extremities: Symmetrical 5 x 5 power.  Labs:  Basic Metabolic Panel:  Lab 03/11/12 0981 03/10/12 0734 03/10/12 0103  NA 137 -- 143  K 3.7 -- 3.9  CL 107 -- 111  CO2 25 -- --  GLUCOSE 88 -- 90  BUN 23 -- 24*  CREATININE 1.04 1.05 1.10  CALCIUM 9.3 -- --  ALB -- -- --  PHOS -- -- --   Liver Function Tests:  Lab 03/10/12 0730  AST 27  ALT 40  ALKPHOS 82  BILITOT 0.5  PROT 6.8  ALBUMIN 3.2*    Lab 03/10/12 0730  LIPASE 39  AMYLASE --   No results found for this basename: AMMONIA:3 in the last 168 hours CBC:  Lab 03/12/12 0510 03/11/12 0440 03/10/12 0734 03/10/12 0005  WBC 10.8* 10.3 5.3 --  NEUTROABS -- -- -- --  HGB 15.0 14.5 14.7 --  HCT 43.4 42.7 43.2 --  MCV 87.7 89.3 89.8 89.3  PLT 173 146* 141* --   Cardiac Enzymes:  Lab 03/10/12 2310 03/10/12 1523 03/10/12 0734  CKTOTAL 98 104 120  CKMB 2.8 3.0 3.3  CKMBINDEX -- -- --  TROPONINI <0.30 <0.30 <0.30   CBG: No  results found for this basename: GLUCAP:5 in the last 168 hours  Iron Studies: No results found for this basename: IRON,TIBC,TRANSFERRIN,FERRITIN in the last 72 hours Studies/Results: Nm Myocar Multi W/spect W/wall Motion / Ef  03/12/2012  *RADIOLOGY REPORT*  Clinical Data:  Obstructive CAD.  MYOCARDIAL IMAGING WITH SPECT (REST AND PHARMACOLOGIC-STRESS - 2 DAY PROTOCOL) GATED LEFT VENTRICULAR WALL MOTION STUDY LEFT VENTRICULAR EJECTION FRACTION  Technique:  Standard myocardial SPECT imaging was performed after intravenous injection of 30 mCi Tc-59m tetrofosmin at rest.  On a different day, intravenous infusion of  Lexiscan was performed under supervision of the Cardiology staff.  At peak effect of the drug, 30 mCi Tc-71m tetrofosmin was injected intravenously and standard myocardial SPECT imaging was performed.  Quantitative gated imaging was also performed to evaluate left ventricular wall motion and estimate left ventricular ejection fraction.  Comparison:  None  Findings: SPECT imaging demonstrates decreased activity in the anterior wall on the stress images compared to the rest images concerning for anterior ischemia.  Quantitative gated analysis shows normal wall motion.  The resting left ventricular ejection fraction is 47% with end- diastolic volume of 149 ml and end-systolic volume of 79  ml.  IMPRESSION: Findings concerning for inducible ischemia in the anterior wall.  Ejection fraction 47%.  Original Report Authenticated By: Cyndie Chime, M.D.   Medications:      . aspirin EC  325 mg Oral Daily  . docusate sodium  100 mg Oral BID  . enoxaparin  40 mg Subcutaneous Q24H  . hydrochlorothiazide  25 mg Oral Daily  . lisinopril  10 mg Oral Daily  . metoprolol tartrate  50 mg Oral BID  . predniSONE  60 mg Oral QAC breakfast  . sodium chloride  3 mL Intravenous Q12H  . sodium chloride  3 mL Intravenous Q12H    I  have reviewed scheduled and prn medications.     Problem/Plan: Principal  Problem:  *Chest pain on exertion Active Problems:  DOE (dyspnea on exertion)  Sarcoidosis  HTN (hypertension)  Noncompliance  1. Chest pain: ruled out for acute coronary syndrome by negative cardiac enzymes. VQ scan and lower extremity venous Dopplers were negative. Stress test findings concerning for inducible ischemia in the anterior wall and LVEF of 47%. [facility] cardiology consulted. 2. Uncontrolled hypertension: Continue metoprolol to 50 twice a day and HCTZ 25 mg daily. Blood pressure is still high requiring when necessary doses of IV hydralazine. Add lisinopril 10 mg by mouth daily. 3. Medication noncompliance: Counseled patient in the presence of his wife and son regarding compliance with medications and M.D. followups and diet. Patient verbalized understanding. 4. Dyspnea on exertion: Unclear etiology. As indicated above VQ scan was negative for PE and chest x-ray showed mediastinal adenopathy which is unchanged. Will need outpatient pulmonology consultation for further evaluation and management. He may need pulmonary function tests and sleep study.  5. Sarcoidosis: OP follow up. 6. Mild thrombocytopenia: resolved  Disposition: pending Cardiology consultation.  Aunesti Pellegrino 03/12/2012,4:59 PM  LOS: 3 days

## 2012-03-13 ENCOUNTER — Encounter (HOSPITAL_COMMUNITY)
Admission: EM | Disposition: A | Discharge status: Home / Self Care | Payer: Self-pay | Source: Ambulatory Visit | Attending: Internal Medicine

## 2012-03-13 ENCOUNTER — Encounter (HOSPITAL_COMMUNITY): Payer: Self-pay | Admitting: Cardiovascular Disease

## 2012-03-13 DIAGNOSIS — R079 Chest pain, unspecified: Secondary | ICD-10-CM

## 2012-03-13 HISTORY — PX: LEFT HEART CATHETERIZATION WITH CORONARY ANGIOGRAM: SHX5451

## 2012-03-13 LAB — BASIC METABOLIC PANEL
BUN: 30 mg/dL — ABNORMAL HIGH (ref 6–23)
Calcium: 9.9 mg/dL (ref 8.4–10.5)
GFR calc non Af Amer: 79 mL/min — ABNORMAL LOW (ref >90)
Glucose, Bld: 155 mg/dL — ABNORMAL HIGH (ref 70–99)

## 2012-03-13 LAB — LIPID PANEL
Total CHOL/HDL Ratio: 3.4 RATIO
VLDL: 12 mg/dL (ref 0–40)

## 2012-03-13 SURGERY — LEFT HEART CATHETERIZATION WITH CORONARY ANGIOGRAM
Anesthesia: LOCAL

## 2012-03-13 MED ORDER — WARFARIN SODIUM 10 MG PO TABS
10.0000 mg | ORAL_TABLET | Freq: Once | ORAL | Status: AC
  Administered 2012-03-13: 10 mg via ORAL
  Filled 2012-03-13: qty 1

## 2012-03-13 MED ORDER — ACETAMINOPHEN 325 MG PO TABS
650.0000 mg | ORAL_TABLET | ORAL | Status: DC | PRN
  Administered 2012-03-14 – 2012-03-16 (×4): 650 mg via ORAL
  Filled 2012-03-13 (×2): qty 2

## 2012-03-13 MED ORDER — SODIUM CHLORIDE 0.9 % IV SOLN: INTRAVENOUS | Status: AC

## 2012-03-13 MED ORDER — WARFARIN - PHARMACIST DOSING INPATIENT
Freq: Every day | Status: DC
  Administered 2012-03-13: 19:00:00

## 2012-03-13 MED ORDER — ONDANSETRON HCL 4 MG/2ML IJ SOLN: 4.0000 mg | Freq: Four times a day (QID) | INTRAMUSCULAR | Status: DC | PRN

## 2012-03-13 MED ORDER — COUMADIN BOOK
Freq: Once | Status: AC
  Administered 2012-03-14: 10:00:00
  Filled 2012-03-13: qty 1

## 2012-03-13 MED ORDER — WARFARIN VIDEO
Freq: Once | Status: AC
  Administered 2012-03-14: 12:00:00

## 2012-03-13 NOTE — Interval H&P Note (Signed)
History and Physical Interval Note:  03/13/2012 11:04 AM  Tony Howard  has presented today for surgery, with the diagnosis of Chest pain, abnormal myoview  The various methods of treatment have been discussed with the patient and family. After consideration of risks, benefits and other options for treatment, the patient has consented to  Procedure(s) (LRB): LEFT HEART CATHETERIZATION WITH CORONARY ANGIOGRAM (N/A) as a surgical intervention .  The patients' history has been reviewed, patient examined, no change in status, stable for surgery.  I have reviewed the patients' chart and labs.  Questions were answered to the patient's satisfaction.    Pt presents with CP ( occasionally exertional) as well as leg pain.  ECG reveals TWI in the anterior and lateral leads.  He is scheduled for cath.  Discussed risks, benefits, options.  He understands and agrees to proceed.  [name].

## 2012-03-13 NOTE — Progress Notes (Signed)
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 Naprosyn (Naproxen) Hives  . Iodine Hives and Rash    shellfish    Patient Measurements: Height: 6\' 5"  (195.6 cm) Weight: 296 lb 4.8 oz (134.4 kg) IBW/kg (Calculated) : 89.1    Vital Signs: Temp: 97.6 F (36.4 C) (04/05 1520) Temp src: Oral (04/05 1520) BP: 156/97 mmHg (04/05 1520) Pulse Rate: 79  (04/05 1520)  Labs:  Basename 03/13/12 0440 03/12/12 0510 03/11/12 0440 03/10/12 2310  HGB -- 15.0 14.5 --  HCT -- 43.4 42.7 --  PLT -- 173 146* --  APTT -- -- -- --  LABPROT 13.1 -- -- --  INR 0.97 -- -- --  HEPARINUNFRC -- -- -- --  CREATININE 1.07 -- 1.04 --  CKTOTAL -- -- -- 98  CKMB -- -- -- 2.8  TROPONINI -- -- -- <0.30   Estimated Creatinine Clearance: 125.2 ml/min (by C-G formula based on Cr of 1.07).  Medical History: Past Medical History  Diagnosis Date  . Chronic pain   . Hypertension   . Osteoarthritis   . Sarcoidosis of lung     Medications:  Scheduled:    . aspirin  324 mg Oral Pre-Cath  . aspirin EC  325 mg Oral Daily  . carvedilol  12.5 mg Oral BID WC  . diphenhydrAMINE  25 mg Intravenous On Call  . docusate sodium  100 mg Oral BID  . enoxaparin  40 mg Subcutaneous Q24H  . famotidine (PEPCID) IV  20 mg Intravenous On Call  . hydrochlorothiazide  25 mg Oral Daily  . lisinopril  10 mg Oral Once  . lisinopril  20 mg Oral Daily  . predniSONE  60 mg Oral Pre-Cath   Followed by  . predniSONE  60 mg Oral Pre-Cath  . sodium chloride  3 mL Intravenous Q12H  . sodium chloride  3 mL Intravenous Q12H  . DISCONTD: aspirin EC  325 mg Oral Daily  . DISCONTD: lisinopril  10 mg Oral Daily  . DISCONTD: metoprolol tartrate  50 mg Oral BID  . DISCONTD: sodium chloride  3 mL Intravenous Q12H   Infusions:    . 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    INR is WNL at baseline  CBC WNL  Pt is also receiving lovenox 40 mg SQ Q24h for vte prophylaxis, will f/u d/c when INR appropriate.   High coumadin score of 10 given age, weight, race, sex  Goal of Therapy:  INR 2-3   Plan:  1.) Coumadin 10 mg po x 1 tonight 2.) Daily PT/INR 3.) Coumadin book/video/education  Sohrab Keelan, Loma Messing PharmD 4:39 PM 03/13/2012

## 2012-03-13 NOTE — Progress Notes (Signed)
CARE MANAGEMENT NOTE 03/13/2012  Patient:  Tony Howard, Tony Howard   Account Number:  [id number]  Date Initiated:  03/10/2012  Documentation initiated by:  Carthel Castille  Subjective/Objective Assessment:   admitted with chest with increased duration and intenisty, pe to be r/o versus cardiac,     Action/Plan:   lives at home   Anticipated DC Date:  03/13/2012   Anticipated DC Plan:  HOME/SELF CARE  In-house referral  NA      DC Planning Services  NA      [facility] Choice  NA   Choice offered to / List presented to:  NA   DME arranged  NA      DME agency  NA     HH arranged  NA      HH agency  NA   Status of service:  In process, will continue to follow Medicare Important Message given?  NA - LOS <3 / Initial given by admissions (If response is "NO", the following Medicare IM given date fields will be blank) Date Medicare IM given:   Date Additional Medicare IM given:    Discharge Disposition:    Per UR Regulation:  Reviewed for med. necessity/level of care/duration of stay  If discussed at Long Length of Stay Meetings, dates discussed:    Comments:  [id number]/late entry for [id number]/Ercel Normoyle,RN,BSN,CCM: Information and print outs given and discussed with wife and patient concerning outpatient follow up.  [name], [facility], and drug discout pharmacy card given to both.  Verbally understands that he will need to have post hosp follow up done.  [id number] Sayge Brienza,RN,BSn,CCM

## 2012-03-13 NOTE — Progress Notes (Addendum)
Subjective:   Complains of occasional intermittent chest pain. No further nausea or headache.   Objective  Vital signs in last 24 hours: Filed Vitals:   03/12/12 1714 03/12/12 2105 03/13/12 0629 03/13/12 0900  BP: 153/91 136/83 136/84 145/83  Pulse:  76 75 67  Temp:  97.3 F (36.3 C) 97.6 F (36.4 C) 98.3 F (36.8 C)  TempSrc:  Oral Oral Oral  Resp:  18 18 16   Height:      Weight:   134.4 kg (296 lb 4.8 oz)   SpO2:  95% 98% 96%   Weight change:   Intake/Output Summary (Last 24 hours) at 03/13/12 1135 Last data filed at 03/13/12 1028  Gross per 24 hour  Intake     33 ml  Output      0 ml  Net     33 ml    Physical Exam:  General Exam: Comfortable.  Respiratory System: Clear. No increased work of breathing.  Cardiovascular System: First and second heart sounds heard. Regular rate and rhythm. No JVD/murmurs. Telemetry shows normal sinus rhythm. Gastrointestinal System: Abdomen is non distended, soft and normal bowel sounds heard.  Central Nervous System: Alert and oriented. No focal neurological deficits. Extremities: Symmetrical 5 x 5 power.  Labs:  Basic Metabolic Panel:  Lab 03/13/12 7829 03/11/12 0440 03/10/12 0734 03/10/12 0103  NA 135 137 -- 143  K 4.1 3.7 -- 3.9  CL 102 107 -- 111  CO2 22 25 -- --  GLUCOSE 155* 88 -- 90  BUN 30* 23 -- 24*  CREATININE 1.07 1.04 1.05 --  CALCIUM 9.9 9.3 -- --  ALB -- -- -- --  PHOS -- -- -- --   Liver Function Tests:  Lab 03/10/12 0730  AST 27  ALT 40  ALKPHOS 82  BILITOT 0.5  PROT 6.8  ALBUMIN 3.2*    Lab 03/10/12 0730  LIPASE 39  AMYLASE --   No results found for this basename: AMMONIA:3 in the last 168 hours CBC:  Lab 03/12/12 0510 03/11/12 0440 03/10/12 0734 03/10/12 0005  WBC 10.8* 10.3 5.3 --  NEUTROABS -- -- -- --  HGB 15.0 14.5 14.7 --  HCT 43.4 42.7 43.2 --  MCV 87.7 89.3 89.8 89.3  PLT 173 146* 141* --   Cardiac Enzymes:  Lab 03/10/12 2310 03/10/12 1523 03/10/12 0734  CKTOTAL 98 104 120    CKMB 2.8 3.0 3.3  CKMBINDEX -- -- --  TROPONINI <0.30 <0.30 <0.30   CBG: No results found for this basename: GLUCAP:5 in the last 168 hours  Iron Studies: No results found for this basename: IRON,TIBC,TRANSFERRIN,FERRITIN in the last 72 hours Studies/Results: Nm Myocar Multi W/spect W/wall Motion / Ef  03/12/2012  *RADIOLOGY REPORT*  Clinical Data:  Obstructive CAD.  MYOCARDIAL IMAGING WITH SPECT (REST AND PHARMACOLOGIC-STRESS - 2 DAY PROTOCOL) GATED LEFT VENTRICULAR WALL MOTION STUDY LEFT VENTRICULAR EJECTION FRACTION  Technique:  Standard myocardial SPECT imaging was performed after intravenous injection of 30 mCi Tc-49m tetrofosmin at rest.  On a different day, intravenous infusion of  Lexiscan was performed under supervision of the Cardiology staff.  At peak effect of the drug, 30 mCi Tc-9m tetrofosmin was injected intravenously and standard myocardial SPECT imaging was performed.  Quantitative gated imaging was also performed to evaluate left ventricular wall motion and estimate left ventricular ejection fraction.  Comparison:  None  Findings: SPECT imaging demonstrates decreased activity in the anterior wall on the stress images compared to the rest images concerning for  anterior ischemia.  Quantitative gated analysis shows normal wall motion.  The resting left ventricular ejection fraction is 47% with end- diastolic volume of 149 ml and end-systolic volume of 79 ml.  IMPRESSION: Findings concerning for inducible ischemia in the anterior wall.  Ejection fraction 47%.  Original Report Authenticated By: Cyndie Chime, M.D.   Medications:      . aspirin  324 mg Oral Pre-Cath  . aspirin EC  325 mg Oral Daily  . carvedilol  12.5 mg Oral BID WC  . diphenhydrAMINE  25 mg Intravenous On Call  . docusate sodium  100 mg Oral BID  . enoxaparin  40 mg Subcutaneous Q24H  . famotidine (PEPCID) IV  20 mg Intravenous On Call  . hydrochlorothiazide  25 mg Oral Daily  . lisinopril  10 mg Oral Once  .  lisinopril  20 mg Oral Daily  . predniSONE  60 mg Oral Pre-Cath   Followed by  . predniSONE  60 mg Oral Pre-Cath  . predniSONE  60 mg Oral QAC breakfast  . sodium chloride  3 mL Intravenous Q12H  . sodium chloride  3 mL Intravenous Q12H  . sodium chloride  3 mL Intravenous Q12H  . DISCONTD: aspirin EC  325 mg Oral Daily  . DISCONTD: lisinopril  10 mg Oral Daily  . DISCONTD: metoprolol tartrate  50 mg Oral BID    I  have reviewed scheduled and prn medications.     Problem/Plan: Principal Problem:  *Chest pain on exertion Active Problems:  DOE (dyspnea on exertion)  Sarcoidosis  HTN (hypertension)  Noncompliance  1. Chest pain: ruled out for acute coronary syndrome by negative cardiac enzymes. VQ scan and lower extremity venous Dopplers were negative. Stress test findings concerning for inducible ischemia in the anterior wall and LVEF of 47%. [facility] cardiology  input appreciated. Patient is for left heart cath and possible PCI today at [facility]. No significant disease by cath, consider cardiac MRI as outpatient.  2. Uncontrolled hypertension: control. Currently on carvedilol 12.5 mg by mouth twice a day, HCTZ 25 mg by mouth daily and lisinopril 20 mg by mouth daily. 3. Medication noncompliance:  date the counseled regarding compliance with medications and M.D. followups. 4. Dyspnea on exertion: Unclear etiology. As indicated above VQ scan was negative for PE and chest x-ray showed mediastinal adenopathy which is unchanged. Will need outpatient pulmonology consultation for further evaluation and management. He may need pulmonary function tests and sleep study.  5. Sarcoidosis: OP follow up. 6. Mild thrombocytopenia: resolved  Disposition: pending  Cardiology clearance .  Addendum:  Discussed with Dr. [name], cardiology. Reviewed cardiac cath results. 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  Hellon Vaccarella 03/13/2012,11:35 AM  LOS: 4 days

## 2012-03-13 NOTE — H&P (View-Only) (Signed)
    Subjective:  No chest pain or dyspnea overnight. No other complaints this am.  Objective:  Vital Signs in the last 24 hours: Temp:  [97.3 F (36.3 C)-98.3 F (36.8 C)] 98.3 F (36.8 C) (04/05 0900) Pulse Rate:  [67-76] 67  (04/05 0900) Resp:  [16-20] 16  (04/05 0900) BP: (136-162)/(83-109) 145/83 mmHg (04/05 0900) SpO2:  [95 %-98 %] 96 % (04/05 0900) Weight:  [134.4 kg (296 lb 4.8 oz)] 134.4 kg (296 lb 4.8 oz) (04/05 0629)  Intake/Output from previous day:    Physical Exam: Pt is alert and oriented, pleasant obese male in NAD HEENT: normal Neck: JVP - normal Lungs: CTA bilaterally CV: RRR without murmur or gallop Abd: soft, NT Ext: no C/C/E, distal pulses intact and equal Skin: warm/dry no rash  Lab Results:  Basename 03/12/12 0510 03/11/12 0440  WBC 10.8* 10.3  HGB 15.0 14.5  PLT 173 146*    Basename 03/13/12 0440 03/11/12 0440  NA 135 137  K 4.1 3.7  CL 102 107  CO2 22 25  GLUCOSE 155* 88  BUN 30* 23  CREATININE 1.07 1.04    Basename 03/10/12 2310 03/10/12 1523  TROPONINI <0.30 <0.30   Assessment/Plan:  1. Atypical chest pain with abnormal EKG and Myoview suggestive of anterior ischemia. Left heart cath and possible PCI today - risks, indications, alternatives reviewed with patient in detail with risks including but not limited to stroke, MI, major bleeding, and death. He understands and agrees to proceed. 2. HTN - BP better after change to carvedilol and increase of lisinopril. Continue to follow. 3. Dispo - per primary team. Note the patient requests to return to [place] post-cath regardless of findings for logistical reasons. His family is unable to come with him to [facility] and wife would like a phone call with cath results. Her cell is [phone].  Noemi Ishmael, M.D. 03/13/2012, 9:54 AM     

## 2012-03-13 NOTE — Progress Notes (Deleted)
Jeanella Craze, NP Nurse Practitioner [facility] Progress Notes 03/13/2012 11:16 AM  Name: Tony Howard MRN:  [id number] DOB:   09/16/1931 PCP -   Dr. Jarome Matin LOS:   8    PCCM   History of Present Illness: 51 yo male with hx HTN, CVA, DM presented 3/28 with nausea, vomiting, diarrhea and poor PO intake and was initially admitted by internal medicine with anemia, sigmoid diverticulitis and acute on chronic renal failure.  She was treated with abx and followed by GI.  On 4/2 began to develop some increased SOB with LLL consolidation on CXR. She worsened early am 4/3 requiring intubation and PCCM consulted.     Lines / Drains: ETT 4/3>>> L IJ CVL 4/3>>>   Cultures: Urine 328>>>mult bact, recollect   CDiff 4/1>>> NEG Sputum 4/3>>> BCx2 4/3>>> Urine 4/3>>>   Antibiotics: Zosyn 3/30>>> Vanc 4/3>>> Diflucan (oral and vaginal candida) 4/3>>>    Tests / Events: 4/1 2D echo>Left ventricle: There was mild concentric hypertrophy. Systolic function was normal. The estimated ejection fraction was in the range of 60% to 65%. Probable severe hypokinesis of the basalinferior myocardium. Doppler parameters are consistent with abnormal left ventricularrelaxation (grade 1 diastolic dysfunction). Doppler parameters are consistent with high ventricular filling pressure.  Aortic valve: Valve area: 2.22cm^2(VTI). Valve area: 2.12cm^2 (Vmax). Mitral valve: Moderately to severely calcified annulus. Mild regurgitation. Valve area by continuity equation (using LVOT flow): 2.43cm^2.  Right ventricle: Systolic pressure was moderately to severely increased. Tricuspid valve: Mild-moderate regurgitation.  Pulmonary arteries: Systolic pressure was severely increased. PA peak pressure: 55mm Hg (S).           Vital Signs: Filed Vitals:     03/13/12 0600  03/13/12 0700  03/13/12 0857  03/13/12 0900   BP:  136/73  98/51    119/54   Pulse:  98  93    107   Temp:    99.5  F (37.5 C)       TempSrc:    Axillary       Resp:  16  18    18    Height:           Weight:           SpO2:  98%  95%  97%  97%      Intake/Output Summary (Last 24 hours) at 03/13/12 1116 Last data filed at 03/13/12 0500   Gross per 24 hour   Intake    2270 ml   Output     885 ml   Net    1385 ml        Physical Examination:   General: chronically ill appearing, NAD HEENT: mm moist, ETT Neuro:  RASS= 0, nods appropriately /interactive, follows simple commands, MAE Lungs: resps even non labored on vent,  Coarse R>L, exp wheeze   Cards: s1s2 rrr, no m/r/g Abd: round, mildly distended, non tender, hypoactive bs Ext: warm and dry, no edema      Ventilator settings: Vent Mode:  [-] CPAP FiO2 (%):  [30 %] 30 % Set Rate:  [18 bmp] 18 bmp Vt Set:  [500 mL] 500 mL PEEP:  [5 cmH20] 5 cmH20 Pressure Support:  [12 cmH20-14 cmH20] 14 cmH20 Plateau Pressure:  [21 cmH20-25 cmH20] 21 cmH20   Labs and Imaging:    CBC Lab  03/13/12 0400  03/12/12 0344  03/11/12 0400   HGB  10.8*  10.8*  13.4   HCT  33.0*  32.6*  41.8   WBC  10.4  8.9  14.4*   PLT  214  265  349        BMET Lab  03/13/12 0400  03/12/12 1530  03/12/12 0344  03/11/12 0920  03/11/12 0400  03/10/12 0409   NA  136  137  139  --  139  140   K  3.7  3.9  --  --  --  --   CL  101  101  99  --  98  102   CO2  27  29  30   --  34*  26   GLUCOSE  203*  260*  232*  --  143*  120*   BUN  15  14  13   --  9  11   CREATININE  0.91  1.00  1.13*  --  1.09  1.10   CALCIUM  7.2*  7.1*  7.8*  --  9.0  8.6   MG  2.1  --  1.0*  1.0*  --  1.1*   PHOS  --  --  0.3*  0.8*  --  --        ABG    Component  Value  Date/Time     PHART  7.307*  03/11/2012 0810     PCO2ART  60.7*  03/11/2012 0810     PO2ART  222.0*  03/11/2012 0810     HCO3  29.4*  03/11/2012 0810     TCO2  26.8  03/11/2012 0810     O2SAT  99.5  03/11/2012 0810      Dg Chest Port 1 View   03/13/2012  *RADIOLOGY REPORT*  Clinical Data: Ventilator.  PORTABLE  CHEST - 1 VIEW  Comparison: 03/12/2012  Findings: Worsening bilateral aeration with increasing diffuse airspace disease, most confluent in the lung bases, vertically right lung base.  This could represent edema or infection.  Support devices are stable.  Heart is mildly enlarged.  Small bilateral effusions.  IMPRESSION: Worsening airspace disease and effusions.  Question edema versus infection.  Original Report Authenticated By: Cyndie Chime, M.D.    Dg Chest Port 1 View   03/12/2012  *RADIOLOGY REPORT*  Clinical Data: Pneumonia.  PORTABLE CHEST - 1 VIEW  Comparison: 03/11/2012.  Findings: Endotracheal tube tip 2.2 cm above the carina.  Right central line tip distal superior vena cava level.  Scoliosis. Rotation of the mediastinum and cardiac silhouette.  Pulmonary vascular congestion/pulmonary edema. Consolidation medial left lung base and right mid to lower lung zone unchanged.  Calcified tortuous aorta.  No gross pneumothorax.  Nasogastric tube has been placed with the side hole at the gastric fundus - body level.  Tip is not included present exam.  IMPRESSION: Placement of nasogastric tube otherwise no significant change.  Original Report Authenticated By: Fuller Canada, M.D.         Assessment and Plan:   Acute resp failure -- Likely multifactorial in setting diastolic dysfunction, pulmonary HTN (per recent echo) acutely c/b probable mucous plugging, severe deconditioning +/- HCAP and component CHF.  Fevers 4/4, tachypnea on wean with marginal volumes.   PLAN -   Vent support - 8cc/kg F/u CXR    Ideally keep dry as tolerated   Daily SBT Lasix x1 4/5   Acute on chronic renal failure -- baseline SCr ~1.0   Lab  03/13/12 0400  03/12/12 1530  03/12/12 0344  03/11/12 0400  03/10/12 0409   CREATININE  0.91  1.00  1.13*  1.09  1.10    PLAN -   F/u chem   Hypokalemia --   Plan -   Replete F/u chem   Anemia -- normocytic, not iron deficient per labs. No s/s active bleeding.     Lab  03/13/12 0400  03/12/12 0344  03/11/12 0400  03/10/12 0409  03/09/12 0530   HGB  10.8*  10.8*  13.4  12.8  11.7*    PLAN -   GI following Stools heme NEG Will need EGD and colonoscopy   Transfuse for Hgb <7   DM --  SSI     Hypotension -- likely r/t diuresis.  Resolved 4/4 PLAN -   Monitor        Diverticulitis -- GI following.  ? If this is truly the only source of her cont nausea, diarrhea and anorexia.  C-diff neg 4/1.   PLAN -   Cont abx   Protein calorie malnutrition -  With prolonged anorexia   PLAN -   TF per nutrition    CHF R and L See echo 4/1       Best practices / Disposition: -->ICU status under PCCM -->full code -->SCD's for DVT Px -->Protonix for GI Px -->ventilator bundle     Canary Brim, NP-C  Pulmonary & Critical Care Pgr: [phone]

## 2012-03-13 NOTE — Op Note (Addendum)
    Cardiac Cath Note  Kamauri WAYLIN DORKO [id number] 1961/06/12  Procedure: left Heart Cardiac Catheterization Note Indications: chest pain, abnormal stress myoview  Procedure Details Consent: Obtained Time Out: Verified patient identification, verified procedure, site/side was marked, verified correct patient position, special equipment/implants available, Radiology Safety Procedures followed,  medications/allergies/relevent history reviewed, required imaging and test results available.  Performed   Medications: Fentanyl: 50 mcg IV Versed: 2 mg IV  The right femoral artery was easily canulated using a modified Seldinger technique.  Hemodynamics:   LV pressure: 144/20 Aortic pressure: 142/102  Angiography   Left Main: The Left main is smooth and normal.  Left anterior Descending: The LAD is smooth and normal throughout it's course.  There are several small diagonals that are smooth and normal.  The distal LAD wraps around the apex  Left Circumflex: The left circumflex is a moderate - large artery.  It is smooth and normal throughout it's course.  There are several small marginal branches that are normal.  Ramus Intermediate Artery:   The ramus intermediate artery is large.  It is smooth and normal .  Right Coronary Artery:  The RCA is moderate in size.  It is smooth and normal throughout it's course.    LV Gram: The LV gram was performed in a 30 RAO position.  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  The contractility in the remaining areas is well preserved and is actually quite vigorous.  The EF is 60-65%.  Angiogram of the RFA shows no significant atherosclerosis.    The RFA was closed using angioseal in standard sterile fashion  Complications: No apparent complications Patient did tolerate procedure well.  Conclusions:   1. Smooth and normal coronaries.  2. 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   Will review this with my partners.  He  may need further studies ( MRI, repeat echo with contrast)  Given the size and location of this aneurism, I think he will need anticoagulation if there are no other contraindications.  This aneurism has a high probability of forming thrombus.     Vesta Mixer, Montez Hageman., MD, [facility] 03/13/2012, 11:58 AM

## 2012-03-13 NOTE — Discharge Instructions (Addendum)
Call [facility] for follow care upon arrival to home. Follow handout given for instructions and contact numbers.  And have prescriptions filled at [facility] on [address]. May use Discount care for pharmacy if accepted.   1.Coumadin Clinic with the [facility]  [address] Wednesday  03/18/2012.  At 0930 am.    2.[facility] with Dr. Shirlee Latch at [facility] care at [address].  April, 16,2013 at 101 am.

## 2012-03-13 NOTE — Progress Notes (Signed)
    Subjective:  No chest pain or dyspnea overnight. No other complaints this am.  Objective:  Vital Signs in the last 24 hours: Temp:  [97.3 F (36.3 C)-98.3 F (36.8 C)] 98.3 F (36.8 C) (04/05 0900) Pulse Rate:  [67-76] 67  (04/05 0900) Resp:  [16-20] 16  (04/05 0900) BP: (136-162)/(83-109) 145/83 mmHg (04/05 0900) SpO2:  [95 %-98 %] 96 % (04/05 0900) Weight:  [134.4 kg (296 lb 4.8 oz)] 134.4 kg (296 lb 4.8 oz) (04/05 0629)  Intake/Output from previous day:    Physical Exam: Pt is alert and oriented, pleasant obese male in NAD HEENT: normal Neck: JVP - normal Lungs: CTA bilaterally CV: RRR without murmur or gallop Abd: soft, NT Ext: no C/C/E, distal pulses intact and equal Skin: warm/dry no rash  Lab Results:  Basename 03/12/12 0510 03/11/12 0440  WBC 10.8* 10.3  HGB 15.0 14.5  PLT 173 146*    Basename 03/13/12 0440 03/11/12 0440  NA 135 137  K 4.1 3.7  CL 102 107  CO2 22 25  GLUCOSE 155* 88  BUN 30* 23  CREATININE 1.07 1.04    Basename 03/10/12 2310 03/10/12 1523  TROPONINI <0.30 <0.30   Assessment/Plan:  1. Atypical chest pain with abnormal EKG and Myoview suggestive of anterior ischemia. Left heart cath and possible PCI today - risks, indications, alternatives reviewed with patient in detail with risks including but not limited to stroke, MI, major bleeding, and death. He understands and agrees to proceed. 2. HTN - BP better after change to carvedilol and increase of lisinopril. Continue to follow. 3. Dispo - per primary team. Note the patient requests to return to [facility] regardless of findings for logistical reasons. His family is unable to come with him to [facility] and wife would like a phone call with cath results. Her cell is [phone].  Tonny Bollman, M.D. 03/13/2012, 9:54 AM

## 2012-03-14 LAB — BASIC METABOLIC PANEL
Calcium: 9 mg/dL (ref 8.4–10.5)
Creatinine, Ser: 1.02 mg/dL (ref 0.50–1.35)
GFR calc Af Amer: >90 mL/min (ref >90)
GFR calc non Af Amer: 84 mL/min — ABNORMAL LOW (ref >90)
Sodium: 138 mEq/L (ref 135–145)

## 2012-03-14 LAB — PROTIME-INR
INR: 0.99 (ref 0.00–1.49)
Prothrombin Time: 13.3 seconds (ref 11.6–15.2)

## 2012-03-14 MED ORDER — LISINOPRIL 20 MG PO TABS
30.0000 mg | ORAL_TABLET | Freq: Every day | ORAL | Status: DC
  Administered 2012-03-14 – 2012-03-16 (×3): 30 mg via ORAL
  Filled 2012-03-14 (×4): qty 1

## 2012-03-14 MED ORDER — WARFARIN SODIUM 10 MG PO TABS
10.0000 mg | ORAL_TABLET | Freq: Once | ORAL | Status: AC
  Administered 2012-03-14: 10 mg via ORAL
  Filled 2012-03-14: qty 1

## 2012-03-14 NOTE — Progress Notes (Signed)
SUBJECTIVE:  Stable post cath-no complaints  OBJECTIVE:   Vitals:   Filed Vitals:   03/13/12 1610 03/13/12 2108 03/14/12 0540 03/14/12 0747  BP: 150/85 150/83 149/88 147/98  Pulse: 83 80 56 67  Temp: 97.8 F (36.6 C) 97.3 F (36.3 C) 97.6 F (36.4 C)   TempSrc: Oral Oral Oral   Resp: 16 18 18    Height:      Weight:      SpO2: 97% 95% 98%    I&O's:   Intake/Output Summary (Last 24 hours) at 03/14/12 0751 Last data filed at 03/13/12 1028  Gross per 24 hour  Intake     33 ml  Output      0 ml  Net     33 ml   TELEMETRY: Reviewed telemetry pt in NSR     PHYSICAL EXAM General: Well developed, well nourished, in no acute distress Head: Eyes PERRLA, No xanthomas.   Normal cephalic and atramatic  Lungs:   Clear bilaterally to auscultation and percussion. Heart:   HRRR S1 S2 Pulses are 2+ & equal.            No carotid bruit. No JVD.  No abdominal bruits. No femoral bruits. Abdomen: Bowel sounds are positive, abdomen soft and non-tender without masses or                  Hernia's noted. Msk:  Back normal, normal gait. Normal strength and tone for age. Extremities:   No clubbing, cyanosis or edema.  DP +1 Neuro: Alert and oriented X 3. Psych:  Good affect, responds appropriately   LABS: Basic Metabolic Panel:  Basename 03/13/12 0440  NA 135  K 4.1  CL 102  CO2 22  GLUCOSE 155*  BUN 30*  CREATININE 1.07  CALCIUM 9.9  MG --  PHOS --   Liver Function Tests: No results found for this basename: AST:2,ALT:2,ALKPHOS:2,BILITOT:2,PROT:2,ALBUMIN:2 in the last 72 hours No results found for this basename: LIPASE:2,AMYLASE:2 in the last 72 hours CBC:  Basename 03/12/12 0510  WBC 10.8*  NEUTROABS --  HGB 15.0  HCT 43.4  MCV 87.7  PLT 173   Fasting Lipid Panel:  Basename 03/13/12 0440  CHOL 189  HDL 55  LDLCALC 122*  TRIG 62  CHOLHDL 3.4  LDLDIRECT --   Coag Panel:   Lab Results  Component Value Date   INR 0.99 03/14/2012   INR 0.97 03/13/2012     RADIOLOGY: Ct Head Wo Contrast  03/10/2012  *RADIOLOGY REPORT*  Clinical Data: Elevated blood pressure.  Extremity swelling.  Upper neck pain, worse on the right.  CT HEAD WITHOUT CONTRAST  Technique:  Contiguous axial images were obtained from the base of the skull through the vertex without contrast.  Comparison: None.  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  No mass effect or midline shift.  No ventricular dilatation.  No abnormal extra-axial fluid collections.  Gray-white matter junctions are distinct.  Basal cisterns are not effaced.  No evidence of acute intracranial hemorrhage.  No depressed skull fractures.  Visualized paranasal sinuses and mastoid air cells are not opacified.  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  No acute intracranial abnormalities demonstrated.  Original Report Authenticated By: Marlon Pel, M.D.   Nm Pulmonary Perfusion  03/10/2012  *RADIOLOGY REPORT*  Clinical Data: Iodine allergy.  Evaluate for pulmonary embolus. Chest pain, shortness of breath.  NM PULMONARY PERFUSION PARTICULATE  Radiopharmaceutical: CURIE MAA TECHNETIUM TO 56M ALBUMIN AGGREGATED  Comparison: Chest x-ray 03/10/2012  Findings: There are no photopenic defects on the perfusion study to suggest pulmonary embolus.  IMPRESSION: Normal perfusion study.  No evidence of pulmonary embolus.  Original Report Authenticated By: Cyndie Chime, M.D.   Nm Myocar Multi W/spect W/wall Motion / Ef  03/12/2012  *RADIOLOGY REPORT*  Clinical Data:  Obstructive CAD.  MYOCARDIAL IMAGING WITH SPECT (REST AND PHARMACOLOGIC-STRESS - 2 DAY PROTOCOL) GATED LEFT VENTRICULAR WALL MOTION STUDY LEFT VENTRICULAR EJECTION FRACTION  Technique:  Standard myocardial SPECT imaging was performed after intravenous  injection of 30 mCi Tc-66m tetrofosmin at rest.  On a different day, intravenous infusion of  Lexiscan was performed under supervision of the Cardiology staff.  At peak effect of the drug, 30 mCi Tc-76m tetrofosmin was injected intravenously and standard myocardial SPECT imaging was performed.  Quantitative gated imaging was also performed to evaluate left ventricular wall motion and estimate left ventricular ejection fraction.  Comparison:  None  Findings: SPECT imaging demonstrates decreased activity in the anterior wall on the stress images compared to the rest images concerning for anterior ischemia.  Quantitative gated analysis shows normal wall motion.  The resting left ventricular ejection fraction is 47% with end- diastolic volume of 149 ml and end-systolic volume of 79 ml.  IMPRESSION: Findings concerning for inducible ischemia in the anterior wall.  Ejection fraction 47%.  Original Report Authenticated By: Cyndie Chime, M.D.   Dg Chest Port 1 View  03/10/2012  *RADIOLOGY REPORT*  Clinical Data: Shortness of breath.  History of sarcoidosis.  PORTABLE CHEST - 1 VIEW  Comparison: 08/29/2004  Findings: The shallow inspiration.  Heart size and pulmonary vascularity are normal for technique.  No focal airspace consolidation in the lungs.  No blunting of costophrenic angles. Mild prominence of the right hilar and right paratracheal structures consistent with lymphadenopathy.  This is stable since the previous study, given differences in technique.  No pneumothorax.  IMPRESSION: Probable right hilar and mediastinal lymphadenopathy compatible with sarcoidosis.  No significant change since previous study.  No evidence of active infiltration.  Original Report Authenticated By: Marlon Pel, M.D.      ASSESSMENT:  1.  Chest pain with abnormal stress myoview and normal coronary arteries by cath 2.  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  Uncontrolled HTN - still with elevated BP 4.   Sarcoidosis  PLAN:   1.  Increase Lisinopril to 30mg  daily for better BP control 2.  Warfarin load per pharmacy 3.  Check BMET post cath  Quintella Reichert, MD  03/14/2012  7:51 AM

## 2012-03-14 NOTE — Progress Notes (Signed)
ANTICOAGULATION CONSULT NOTE - Follow Up  Pharmacy Consult for Warfarin Indication: LV aneurism with high probability of thrombus/Hx stroke  Allergies  Allergen Reactions  . Naprosyn (Naproxen) Hives  . Iodine Hives and Rash    shellfish    Patient Measurements: Height: 6\' 5"  (195.6 cm) Weight: 296 lb 4.8 oz (134.4 kg) IBW/kg (Calculated) : 89.1    Vital Signs: Temp: 97.6 F (36.4 C) (04/06 0540) Temp src: Oral (04/06 0540) BP: 146/83 mmHg (04/06 0958) Pulse Rate: 67  (04/06 0747)  Labs:  Alvira Philips 03/14/12 0944 03/14/12 0455 03/13/12 0440 03/12/12 0510  HGB -- -- -- 15.0  HCT -- -- -- 43.4  PLT -- -- -- 173  APTT -- -- -- --  LABPROT -- 13.3 13.1 --  INR -- 0.99 0.97 --  HEPARINUNFRC -- -- -- --  CREATININE 1.02 -- 1.07 --  CKTOTAL -- -- -- --  CKMB -- -- -- --  TROPONINI -- -- -- --   Estimated Creatinine Clearance: 131.4 ml/min (by C-G formula based on Cr of 1.02).  Medical History: Past Medical History  Diagnosis Date  . Chronic pain   . Hypertension   . Osteoarthritis   . Sarcoidosis of lung     Medications:  Scheduled:     . aspirin EC  325 mg Oral Daily  . carvedilol  12.5 mg Oral BID WC  . coumadin book   Does not apply Once  . docusate sodium  100 mg Oral BID  . enoxaparin  40 mg Subcutaneous Q24H  . hydrochlorothiazide  25 mg Oral Daily  . lisinopril  30 mg Oral Daily  . sodium chloride  3 mL Intravenous Q12H  . sodium chloride  3 mL Intravenous Q12H  . warfarin  10 mg Oral ONCE-1800  . warfarin   Does not apply Once  . Warfarin - Pharmacist Dosing Inpatient   Does not apply q1800  . DISCONTD: lisinopril  20 mg Oral Daily  . DISCONTD: sodium chloride  3 mL Intravenous Q12H   Infusions:     . sodium chloride 150 mL/hr at 03/13/12 1239   PRN: acetaminophen, acetaminophen, hydrALAZINE, LORazepam, morphine, ondansetron (ZOFRAN) IV, ondansetron (ZOFRAN) IV, ondansetron, sodium chloride, zolpidem, DISCONTD: sodium chloride, DISCONTD:  sodium chloride, DISCONTD: sodium chloride  Assessment:  51 yo AA obese M with LV aneurism with high probability of thrombus and a hx of stoke, coumadin started 4/5.    INR about same as baseline as expected after only one dose 10mg  4/5  No bleeding reported  Pt is also receiving Lovenox 40 mg SQ Q24h for vte prophylaxis, will f/u d/c when INR appropriate.   Goal of Therapy:  INR 2-3   Plan:  1.) Coumadin 10 mg po x 1 tonight 2.) Daily PT/INR  Gwen Her PharmD  [phone] 03/14/2012 10:55 AM

## 2012-03-14 NOTE — Progress Notes (Signed)
Subjective:   Denies complaints.  Objective  Vital signs in last 24 hours: Filed Vitals:   03/14/12 0540 03/14/12 0747 03/14/12 0958 03/14/12 1330  BP: 149/88 147/98 146/83 137/88  Pulse: 56 67  78  Temp: 97.6 F (36.4 C)     TempSrc: Oral     Resp: 18     Height:      Weight:      SpO2: 98%      Weight change:   Intake/Output Summary (Last 24 hours) at 03/14/12 1417 Last data filed at 03/14/12 0902  Gross per 24 hour  Intake    360 ml  Output      0 ml  Net    360 ml    Physical Exam:  General Exam: Comfortable.  Respiratory System: Clear. No increased work of breathing.  Cardiovascular System: First and second heart sounds heard. Regular rate and rhythm. No JVD/murmurs. Telemetry shows normal sinus rhythm. Gastrointestinal System: Abdomen is non distended, soft and normal bowel sounds heard.  Central Nervous System: Alert and oriented. No focal neurological deficits. Extremities: Symmetrical 5 x 5 power.  Labs:  Basic Metabolic Panel:  Lab 03/14/12 1610 03/13/12 0440 03/11/12 0440  NA 138 135 137  K 3.6 4.1 3.7  CL 104 102 107  CO2 28 22 25   GLUCOSE 109* 155* 88  BUN 31* 30* 23  CREATININE 1.02 1.07 1.04  CALCIUM 9.0 9.9 9.3  ALB -- -- --  PHOS -- -- --   Liver Function Tests:  Lab 03/10/12 0730  AST 27  ALT 40  ALKPHOS 82  BILITOT 0.5  PROT 6.8  ALBUMIN 3.2*    Lab 03/10/12 0730  LIPASE 39  AMYLASE --   No results found for this basename: AMMONIA:3 in the last 168 hours CBC:  Lab 03/12/12 0510 03/11/12 0440 03/10/12 0734 03/10/12 0005  WBC 10.8* 10.3 5.3 --  NEUTROABS -- -- -- --  HGB 15.0 14.5 14.7 --  HCT 43.4 42.7 43.2 --  MCV 87.7 89.3 89.8 89.3  PLT 173 146* 141* --   Cardiac Enzymes:  Lab 03/10/12 2310 03/10/12 1523 03/10/12 0734  CKTOTAL 98 104 120  CKMB 2.8 3.0 3.3  CKMBINDEX -- -- --  TROPONINI <0.30 <0.30 <0.30   CBG: No results found for this basename: GLUCAP:5 in the last 168 hours  Iron Studies: No results found  for this basename: IRON,TIBC,TRANSFERRIN,FERRITIN in the last 72 hours Studies/Results: No results found. Medications:    . sodium chloride 150 mL/hr at 03/13/12 1239      . aspirin EC  325 mg Oral Daily  . carvedilol  12.5 mg Oral BID WC  . coumadin book   Does not apply Once  . docusate sodium  100 mg Oral BID  . enoxaparin  40 mg Subcutaneous Q24H  . hydrochlorothiazide  25 mg Oral Daily  . lisinopril  30 mg Oral Daily  . sodium chloride  3 mL Intravenous Q12H  . sodium chloride  3 mL Intravenous Q12H  . warfarin  10 mg Oral ONCE-1800  . warfarin  10 mg Oral ONCE-1800  . warfarin   Does not apply Once  . Warfarin - Pharmacist Dosing Inpatient   Does not apply q1800  . DISCONTD: lisinopril  20 mg Oral Daily    I  have reviewed scheduled and prn medications.     Problem/Plan: Principal Problem:  *Chest pain on exertion Active Problems:  DOE (dyspnea on exertion)  Sarcoidosis  HTN (hypertension)  Noncompliance  1. Chest pain: Negative cardiac enzymes but abnormal stress Myoview and normal coronary arteries by cath. VQ scan and lower extremity venous Dopplers were negative.  2. Uncontrolled hypertension: Lisinopril increased to 30 mg daily. Continue carvedilol 12.5 mg by mouth twice a day, HCTZ 25 mg by mouth daily. Blood pressure control is better. 3. Small LV apical aneurysm: Coumadin anticoagulation. 4. Medication noncompliance:  Counseled multiple times. 5. Dyspnea on exertion: Unclear etiology. As indicated above VQ scan was negative for PE and chest x-ray showed mediastinal adenopathy which is unchanged. Will need outpatient pulmonology consultation for further evaluation and management. He may need pulmonary function tests and sleep study.  6. Sarcoidosis: OP follow up. 7. Mild thrombocytopenia: resolved  Disposition: Possible discharge home in the next 48 hours after blood pressure is better controlled and? Cardiac MRI-to decide whether inpatient versus  outpatient.   Tony Howard 03/14/2012,2:17 PM  LOS: 5 days

## 2012-03-15 LAB — PROTIME-INR
INR: 1.05 (ref 0.00–1.49)
Prothrombin Time: 13.9 seconds (ref 11.6–15.2)

## 2012-03-15 MED ORDER — WARFARIN SODIUM 2.5 MG PO TABS
12.5000 mg | ORAL_TABLET | Freq: Once | ORAL | Status: AC
  Administered 2012-03-15: 12.5 mg via ORAL
  Filled 2012-03-15: qty 1

## 2012-03-15 NOTE — Progress Notes (Addendum)
Subjective:   Denies complaints. Ambulated the halls without any dyspnea or chest pain.  Objective  Vital signs in last 24 hours: Filed Vitals:   03/15/12 0435 03/15/12 0745 03/15/12 0953 03/15/12 1339  BP: 123/81 138/84 120/86 129/86  Pulse: 67 65  64  Temp: 97.9 F (36.6 C)   98.2 F (36.8 C)  TempSrc: Oral   Oral  Resp: 18   18  Height:      Weight:      SpO2: 95%   99%   Weight change:   Intake/Output Summary (Last 24 hours) at 03/15/12 1659 Last data filed at 03/15/12 1340  Gross per 24 hour  Intake    843 ml  Output      0 ml  Net    843 ml    Physical Exam:  General Exam: Comfortable.  Respiratory System: Clear. No increased work of breathing.  Cardiovascular System: First and second heart sounds heard. Regular rate and rhythm. No JVD/murmurs. Telemetry shows normal sinus rhythm. Gastrointestinal System: Abdomen is non distended, soft and normal bowel sounds heard.  Central Nervous System: Alert and oriented. No focal neurological deficits. Extremities: Symmetrical 5 x 5 power.  Labs:  Basic Metabolic Panel:  Lab 03/14/12 1610 03/13/12 0440 03/11/12 0440  NA 138 135 137  K 3.6 4.1 3.7  CL 104 102 107  CO2 28 22 25   GLUCOSE 109* 155* 88  BUN 31* 30* 23  CREATININE 1.02 1.07 1.04  CALCIUM 9.0 9.9 9.3  ALB -- -- --  PHOS -- -- --   Liver Function Tests:  Lab 03/10/12 0730  AST 27  ALT 40  ALKPHOS 82  BILITOT 0.5  PROT 6.8  ALBUMIN 3.2*    Lab 03/10/12 0730  LIPASE 39  AMYLASE --   No results found for this basename: AMMONIA:3 in the last 168 hours CBC:  Lab 03/12/12 0510 03/11/12 0440 03/10/12 0734 03/10/12 0005  WBC 10.8* 10.3 5.3 --  NEUTROABS -- -- -- --  HGB 15.0 14.5 14.7 --  HCT 43.4 42.7 43.2 --  MCV 87.7 89.3 89.8 89.3  PLT 173 146* 141* --   Cardiac Enzymes:  Lab 03/10/12 2310 03/10/12 1523 03/10/12 0734  CKTOTAL 98 104 120  CKMB 2.8 3.0 3.3  CKMBINDEX -- -- --  TROPONINI <0.30 <0.30 <0.30   CBG: No results found  for this basename: GLUCAP:5 in the last 168 hours  Iron Studies: No results found for this basename: IRON,TIBC,TRANSFERRIN,FERRITIN in the last 72 hours Studies/Results: No results found. Medications:      . aspirin EC  325 mg Oral Daily  . carvedilol  12.5 mg Oral BID WC  . docusate sodium  100 mg Oral BID  . enoxaparin  40 mg Subcutaneous Q24H  . hydrochlorothiazide  25 mg Oral Daily  . lisinopril  30 mg Oral Daily  . sodium chloride  3 mL Intravenous Q12H  . sodium chloride  3 mL Intravenous Q12H  . warfarin  10 mg Oral ONCE-1800  . warfarin  12.5 mg Oral ONCE-1800  . Warfarin - Pharmacist Dosing Inpatient   Does not apply q1800    I  have reviewed scheduled and prn medications.     Problem/Plan: Principal Problem:  *Chest pain on exertion Active Problems:  DOE (dyspnea on exertion)  Sarcoidosis  HTN (hypertension)  Noncompliance  1. Chest pain: Negative cardiac enzymes but abnormal stress Myoview and normal coronary arteries by cath. VQ scan and lower extremity venous Dopplers were  negative.  2. Uncontrolled hypertension: Lisinopril increased to 30 mg daily. Continue carvedilol 12.5 mg by mouth twice a day, HCTZ 25 mg by mouth daily. Blood pressure control is better. 3. Small LV apical aneurysm: Coumadin anticoagulation. 4. Medication noncompliance:  Counseled multiple times. 5. Dyspnea on exertion: Unclear etiology. As indicated above VQ scan was negative for PE and chest x-ray showed mediastinal adenopathy which is unchanged. Will need outpatient pulmonology consultation for further evaluation and management. He may need pulmonary function tests and sleep study.  6. Sarcoidosis: OP follow up. 7. Mild thrombocytopenia: resolved  Disposition: Patient will verbalize that he cannot tolerate closed MRI study secondary to claustrophobia. Possible discharge tomorrow after arranging outpatient Coumadin followup and will have to follow up as outpatient regarding cardiac MRI.  Will discuss with cardiology tomorrow.   Chanae Gemma 03/15/2012,4:59 PM  LOS: 6 days

## 2012-03-15 NOTE — Progress Notes (Addendum)
SUBJECTIVE:  Doing well no complaints  OBJECTIVE:   Vitals:   Filed Vitals:   03/14/12 1721 03/14/12 2243 03/15/12 0435 03/15/12 0745  BP: 154/99 115/75 123/81 138/84  Pulse: 70 69 67 65  Temp:  97.6 F (36.4 C) 97.9 F (36.6 C)   TempSrc:  Oral Oral   Resp:  18 18   Height:      Weight:      SpO2:  96% 95%    I&O's:   Intake/Output Summary (Last 24 hours) at 03/15/12 3086 Last data filed at 03/15/12 0630  Gross per 24 hour  Intake    603 ml  Output      0 ml  Net    603 ml   TELEMETRY: Reviewed telemetry pt in NSR     PHYSICAL EXAM General: Well developed, well nourished, in no acute distress Head: Eyes PERRLA, No xanthomas.   Normal cephalic and atramatic  Lungs:   Clear bilaterally to auscultation and percussion. Heart:   HRRR S1 S2 Pulses are 2+ & equal. Abdomen: Bowel sounds are positive, abdomen soft and non-tender without masses  Extremities:   No clubbing, cyanosis or edema.  DP +1 Neuro: Alert and oriented X 3. Psych:  Good affect, responds appropriately   LABS: Basic Metabolic Panel:  Basename 03/14/12 0944 03/13/12 0440  NA 138 135  K 3.6 4.1  CL 104 102  CO2 28 22  GLUCOSE 109* 155*  BUN 31* 30*  CREATININE 1.02 1.07  CALCIUM 9.0 9.9  MG -- --  PHOS -- --    Basename 03/13/12 0440  CHOL 189  HDL 55  LDLCALC 122*  TRIG 62  CHOLHDL 3.4  LDLDIRECT --   Coag Panel:   Lab Results  Component Value Date   INR 1.05 03/15/2012   INR 0.99 03/14/2012   INR 0.97 03/13/2012    RADIOLOGY: Ct Head Wo Contrast  03/10/2012  *RADIOLOGY REPORT*  Clinical Data: Elevated blood pressure.  Extremity swelling.  Upper neck pain, worse on the right.  CT HEAD WITHOUT CONTRAST  Technique:  Contiguous axial images were obtained from the base of the skull through the vertex without contrast.  Comparison: None.  Findings: Focal area of encephalomalacia in the right posterior temporal and occipital lobes consistent with old infarct in the distribution of the right  posterior cerebral artery.  No mass effect or midline shift.  No ventricular dilatation.  No abnormal extra-axial fluid collections.  Gray-white matter junctions are distinct.  Basal cisterns are not effaced.  No evidence of acute intracranial hemorrhage.  No depressed skull fractures.  Visualized paranasal sinuses and mastoid air cells are not opacified.  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  No acute intracranial abnormalities demonstrated.  Original Report Authenticated By: Marlon Pel, M.D.   Nm Pulmonary Perfusion  03/10/2012  *RADIOLOGY REPORT*  Clinical Data: Iodine allergy.  Evaluate for pulmonary embolus. Chest pain, shortness of breath.  NM PULMONARY PERFUSION PARTICULATE  Radiopharmaceutical: CURIE MAA TECHNETIUM TO 57M ALBUMIN AGGREGATED  Comparison: Chest x-ray 03/10/2012  Findings: There are no photopenic defects on the perfusion study to suggest pulmonary embolus.  IMPRESSION: Normal perfusion study.  No evidence of pulmonary embolus.  Original Report Authenticated By: Cyndie Chime, M.D.   Nm Myocar Multi W/spect W/wall Motion / Ef  03/12/2012  *RADIOLOGY REPORT*  Clinical Data:  Obstructive CAD.  MYOCARDIAL IMAGING WITH SPECT (REST AND PHARMACOLOGIC-STRESS - 2 DAY PROTOCOL)  GATED LEFT VENTRICULAR WALL MOTION STUDY LEFT VENTRICULAR EJECTION FRACTION  Technique:  Standard myocardial SPECT imaging was performed after intravenous injection of 30 mCi Tc-33m tetrofosmin at rest.  On a different day, intravenous infusion of  Lexiscan was performed under supervision of the Cardiology staff.  At peak effect of the drug, 30 mCi Tc-58m tetrofosmin was injected intravenously and standard myocardial SPECT imaging was performed.  Quantitative gated imaging was also performed to evaluate left ventricular wall motion and estimate left ventricular ejection fraction.  Comparison:  None  Findings: SPECT imaging  demonstrates decreased activity in the anterior wall on the stress images compared to the rest images concerning for anterior ischemia.  Quantitative gated analysis shows normal wall motion.  The resting left ventricular ejection fraction is 47% with end- diastolic volume of 149 ml and end-systolic volume of 79 ml.  IMPRESSION: Findings concerning for inducible ischemia in the anterior wall.  Ejection fraction 47%.  Original Report Authenticated By: Cyndie Chime, M.D.   Dg Chest Port 1 View  03/10/2012  *RADIOLOGY REPORT*  Clinical Data: Shortness of breath.  History of sarcoidosis.  PORTABLE CHEST - 1 VIEW  Comparison: 08/29/2004  Findings: The shallow inspiration.  Heart size and pulmonary vascularity are normal for technique.  No focal airspace consolidation in the lungs.  No blunting of costophrenic angles. Mild prominence of the right hilar and right paratracheal structures consistent with lymphadenopathy.  This is stable since the previous study, given differences in technique.  No pneumothorax.  IMPRESSION: Probable right hilar and mediastinal lymphadenopathy compatible with sarcoidosis.  No significant change since previous study.  No evidence of active infiltration.  Original Report Authenticated By: Marlon Pel, M.D.      ASSESSMENT:  1. Chest pain with abnormal stress myoview and normal coronary arteries by cath  2. 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 HTN now controlled 4. Sarcoidosis    PLAN:   1.  Continue current cardiac meds 2.  Will defer decision of Cardiac MRI to Dr. Elease Hashimoto -ok from cardiac standpoint for discharge.  Please have him followup with Dr. Elease Hashimoto next week in office to determine whether further testing is needed with cardiac MRI or contrast echo.  He will also need followup next week in Dr. Harvie Bridge office for coumadin check.  Quintella Reichert, MD  03/15/2012  9:07 AM

## 2012-03-15 NOTE — Progress Notes (Signed)
ANTICOAGULATION CONSULT NOTE - Follow Up  Pharmacy Consult for Warfarin Indication: LV aneurism with high probability of thrombus/Hx stroke  Allergies  Allergen Reactions  . Naprosyn (Naproxen) Hives  . Iodine Hives and Rash    shellfish    Patient Measurements: Height: 6\' 5"  (195.6 cm) Weight: 296 lb 4.8 oz (134.4 kg) IBW/kg (Calculated) : 89.1    Vital Signs: Temp: 97.9 F (36.6 C) (04/07 0435) Temp src: Oral (04/07 0435) BP: 120/26 mmHg (04/07 0953) Pulse Rate: 65  (04/07 0745)  Labs:  Alvira Philips 03/15/12 0508 03/14/12 0944 03/14/12 0455 03/13/12 0440  HGB -- -- -- --  HCT -- -- -- --  PLT -- -- -- --  APTT -- -- -- --  LABPROT 13.9 -- 13.3 13.1  INR 1.05 -- 0.99 0.97  HEPARINUNFRC -- -- -- --  CREATININE -- 1.02 -- 1.07  CKTOTAL -- -- -- --  CKMB -- -- -- --  TROPONINI -- -- -- --   Estimated Creatinine Clearance: 131.4 ml/min (by C-G formula based on Cr of 1.02).  Medical History: Past Medical History  Diagnosis Date  . Chronic pain   . Hypertension   . Osteoarthritis   . Sarcoidosis of lung     Medications:  Scheduled:     . aspirin EC  325 mg Oral Daily  . carvedilol  12.5 mg Oral BID WC  . docusate sodium  100 mg Oral BID  . enoxaparin  40 mg Subcutaneous Q24H  . hydrochlorothiazide  25 mg Oral Daily  . lisinopril  30 mg Oral Daily  . sodium chloride  3 mL Intravenous Q12H  . sodium chloride  3 mL Intravenous Q12H  . warfarin  10 mg Oral ONCE-1800  . warfarin   Does not apply Once  . Warfarin - Pharmacist Dosing Inpatient   Does not apply q1800   Infusions:    PRN: acetaminophen, acetaminophen, hydrALAZINE, LORazepam, morphine, ondansetron (ZOFRAN) IV, ondansetron (ZOFRAN) IV, ondansetron, sodium chloride, zolpidem  Assessment:  51 yo AA obese M with LV aneurism with high probability of thrombus and a hx of stoke, coumadin started 4/5.    INR up only slightly after 2 x 10mg  4/5-6  No bleeding reported  Pt is also receiving Lovenox  40 mg SQ Q24h for vte prophylaxis, will f/u d/c when INR tx  Goal of Therapy:  INR 2-3   Plan:  1.) Coumadin 12.5 mg po x 1 tonight 2.) Daily PT/INR  Gwen Her PharmD  [phone] 03/15/2012 11:35 AM

## 2012-03-16 DIAGNOSIS — I10 Essential (primary) hypertension: Secondary | ICD-10-CM

## 2012-03-16 MED ORDER — WARFARIN SODIUM 5 MG PO TABS: 10.0000 mg | ORAL_TABLET | Freq: Every day | ORAL | Status: DC

## 2012-03-16 MED ORDER — LISINOPRIL 30 MG PO TABS: 30.0000 mg | ORAL_TABLET | Freq: Every day | ORAL | Status: DC

## 2012-03-16 MED ORDER — CARVEDILOL 12.5 MG PO TABS: 12.5000 mg | ORAL_TABLET | Freq: Two times a day (BID) | ORAL | Status: DC

## 2012-03-16 MED ORDER — WARFARIN SODIUM 2.5 MG PO TABS
12.5000 mg | ORAL_TABLET | Freq: Once | ORAL | Status: AC
  Administered 2012-03-16: 12.5 mg via ORAL
  Filled 2012-03-16: qty 1

## 2012-03-16 MED ORDER — HYDROCHLOROTHIAZIDE 25 MG PO TABS: 25.0000 mg | ORAL_TABLET | Freq: Every day | ORAL | Status: DC

## 2012-03-16 MED ORDER — NITROGLYCERIN 0.4 MG SL SUBL
SUBLINGUAL_TABLET | SUBLINGUAL | Status: AC
  Filled 2012-03-16: qty 25

## 2012-03-16 MED FILL — Heparin Sodium (Porcine) 2 Unit/ML in Sodium Chloride 0.9%: INTRAMUSCULAR | Qty: 2000 | Status: AC

## 2012-03-16 MED FILL — Fentanyl Citrate Inj 0.05 MG/ML: INTRAMUSCULAR | Qty: 2 | Status: AC

## 2012-03-16 MED FILL — Nitroglycerin IV Soln 200 MCG/ML in D5W: INTRAVENOUS | Qty: 1 | Status: AC

## 2012-03-16 MED FILL — Lidocaine HCl Local Preservative Free (PF) Inj 1%: INTRAMUSCULAR | Qty: 30 | Status: AC

## 2012-03-16 MED FILL — Midazolam HCl Inj 2 MG/2ML (Base Equivalent): INTRAMUSCULAR | Qty: 2 | Status: AC

## 2012-03-16 NOTE — Progress Notes (Signed)
CARE MANAGEMENT NOTE 03/16/2012  Patient:  Tony Howard, Tony Howard   Account Number:  [id number]  Date Initiated:  03/10/2012  Documentation initiated by:  Deni Berti  Subjective/Objective Assessment:   admitted with chest with increased duration and intenisty, pe to be r/o versus cardiac,     Action/Plan:   lives at home   Anticipated DC Date:  03/16/2012   Anticipated DC Plan:  HOME/SELF CARE  In-house referral  NA      DC Planning Services  NA      [facility] Choice  NA   Choice offered to / List presented to:  NA   DME arranged  NA      DME agency  NA     HH arranged  NA      HH agency  NA   Status of service:  In process, will continue to follow Medicare Important Message given?  NA - LOS <3 / Initial given by admissions (If response is "NO", the following Medicare IM given date fields will be blank) Date Medicare IM given:   Date Additional Medicare IM given:    Discharge Disposition:    Per UR Regulation:  Reviewed for med. necessity/level of care/duration of stay  If discussed at Long Length of Stay Meetings, dates discussed:    Comments:  ---03/16/2012 1434 by Bjorn Loser Carmaleta Youngers--- patient discharged to home. Appts. for [facility] and the coumadin clinic made, information for [facility] and pharmacare given to patient.    [id number] entry for [id number] [name],RN,BSN,CCM: Information and print outs given and discussed with wife and patient concerning outpatient follow up.  [facility], Pharmacare, and drug discout pharmacy card given to both.  Verbally understands that he will need to have post hosp follow up done.  [id number] Tony Tienda,RN,BSN,CCM

## 2012-03-16 NOTE — Progress Notes (Signed)
ANTICOAGULATION CONSULT NOTE - Follow Up  Pharmacy Consult for Warfarin Indication: LV aneurism with high probability of thrombus/Hx stroke  Allergies  Allergen Reactions  . Naprosyn (Naproxen) Hives  . Iodine Hives and Rash    shellfish    Patient Measurements: Height: 6\' 5"  (195.6 cm) Weight: 296 lb 4.8 oz (134.4 kg) IBW/kg (Calculated) : 89.1    Vital Signs:    Labs:  Basename 03/16/12 0432 03/15/12 0508 03/14/12 0944 03/14/12 0455  HGB -- -- -- --  HCT -- -- -- --  PLT -- -- -- --  APTT -- -- -- --  LABPROT 15.9* 13.9 -- 13.3  INR 1.24 1.05 -- 0.99  HEPARINUNFRC -- -- -- --  CREATININE -- -- 1.02 --  CKTOTAL -- -- -- --  CKMB -- -- -- --  TROPONINI -- -- -- --   Estimated Creatinine Clearance: 131.4 ml/min (by C-G formula based on Cr of 1.02).  Medical History: Past Medical History  Diagnosis Date  . Chronic pain   . Hypertension   . Osteoarthritis   . Sarcoidosis of lung     Medications:  Scheduled:     . carvedilol  12.5 mg Oral BID WC  . docusate sodium  100 mg Oral BID  . enoxaparin  40 mg Subcutaneous Q24H  . hydrochlorothiazide  25 mg Oral Daily  . lisinopril  30 mg Oral Daily  . sodium chloride  3 mL Intravenous Q12H  . sodium chloride  3 mL Intravenous Q12H  . warfarin  12.5 mg Oral ONCE-1800  . warfarin  12.5 mg Oral ONCE-1800  . Warfarin - Pharmacist Dosing Inpatient   Does not apply q1800  . DISCONTD: aspirin EC  325 mg Oral Daily   Infusions:    PRN: acetaminophen, acetaminophen, hydrALAZINE, LORazepam, morphine, ondansetron (ZOFRAN) IV, ondansetron (ZOFRAN) IV, ondansetron, sodium chloride, zolpidem  Assessment:  51 yo AA obese M with LV aneurism with high probability of thrombus and a hx of stoke, coumadin started 4/5.    INR up only slightly   No bleeding reported  Pt is also receiving Lovenox 40 mg SQ Q24h for vte prophylaxis, will f/u d/c when INR tx  Warfarin education completed just now (patient and son present) -  expressed understanding, encouraged patient to have discussion with primary MD managing outpatient warfarin about regulation of green vegetable intake.  Goal of Therapy:  INR 2-3   Plan:  1.) Coumadin 12.5 mg po x 1 tonight 2.) Suggest sending out on warfarin 10mg  PO daily (starting Tue 4/9) - will likely need further dose titration as outpatient.   Darrol Angel, PharmD Pager: [phone] 03/16/2012 12:52 PM

## 2012-03-16 NOTE — Discharge Summary (Signed)
Discharge Summary  COURAGE BIGLOW MR#: [id number]  DOB:[birth year]  Date of Admission: 03/09/2012 Date of Discharge: 03/16/2012  Patient's PCP: No primary provider on file.  Attending Physician:Tayquan Gassman  Consults: Cardiology: Dr. 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 Patient has not been on BP meds for over a year now. Over the last 6 months, he has had episodes of atypical chest pain that occurs, he says, whenever his hip and knee are hurting. The chest pain does not seem to be exertional. He has significant exertional dyspnea, getting short of breath with any moderate activity such as mowing the grass or walking up steps. He has been diagnosed with pulmonary and cutaneous sarcoidosis. He used to see Dr. Alwyn Ren for sarcoidosis years ago but has not had followup in a number of years. Main complaint recently has been pain in his right hip and knee attributed to OA. Prior to admission, he felt throbbing central chest pain that was quite severe. His wife, who is a CNA, checked his BP and found it to be 190/120. She took him to the ER. He was admitted for further evaluation and management. Although emergency department evaluation suggested right-sided weakness patient denied that. CT head showed old stroke.     Discharge Medications Current Discharge Medication List    START taking these medications   Details  carvedilol (COREG) 12.5 MG tablet Take 1 tablet (12.5 mg total) by mouth 2 (two) times daily with a meal. Qty: 60 tablet, Refills: 0    hydrochlorothiazide (HYDRODIURIL) 25 MG  tablet Take 1 tablet (25 mg total) by mouth daily. Qty: 30 tablet, Refills: 0    lisinopril (PRINIVIL,ZESTRIL) 30 MG tablet Take 1 tablet (30 mg total) by mouth daily. Qty: 30 tablet, Refills: 0    warfarin (COUMADIN) 5 MG tablet Take 2 tablets (10 mg total) by mouth daily at 6 PM. Qty: 60 tablet, Refills: 0      STOP taking these medications     naproxen (NAPROSYN) 500 MG tablet Comments:  Reason for Stopping:          Hospital Course: Chest pain on exertion Present on Admission:  .Chest pain on exertion .DOE (dyspnea on exertion) .Sarcoidosis .HTN (hypertension)   1. Chest pain:  Patient cardiac enzymes were cycled and negative. D-dimer was elevated and hence VQ scan was done which was negative for pulmonary embolism. He had stress Myoview which was abnormal showing EF 47% with evidence of anterior ischemia following which cardiology was consulted. They did cardiac cath which showed normal coronaries but also showed a small LV apical aneurysm and overall preserved left ventricle function. EKG had shown anterior and inferior T wave inversions. Chest x-ray was consistent with sarcoidosis with hilar adenopathy. Cardiology continued to see the patient and have cleared him for discharge and outpatient followup with the cardiologist. Aspirin has been discontinued because patient is now on Coumadin anticoagulation.  2. Uncontrolled hypertension:  blood pressure is well controlled on lisinopril 30 mg by mouth daily, carvedilol 12.5 mg by mouth twice a day and HCTZ 25 mg by mouth daily.  3. Small LV apical aneurysm: Coumadin anticoagulation  secondary to risk of thromboembolism and stroke. CT head showed prior stroke. Cardiology did not recommend heparin bridging. 4. Medication noncompliance: Counseled multiple times regarding compliance with medications and M.D. followups. This was discussed with family too. Patient and family verbalized understanding. . 5. Dyspnea on exertion: Unclear  etiology. As indicated above VQ scan was negative for PE and chest x-ray showed mediastinal adenopathy which is unchanged. Arranged for outpatient pulmonary consultation for further evaluation. Patient says he's been ambulating in the halls with mild intermittent dyspnea after walking a couple of times. No chest pain. 6. Sarcoidosis: OP follow up. 7. Mild thrombocytopenia: resolved 8. Osteoarthritis: Patient is advised not to use NSAIDs and can use when necessary Tylenol for pain.   Day of Discharge BP 131/81  Pulse 70  Temp(Src) 97.6 F (36.4 C) (Oral)  Resp 18  Ht 6\' 5"  (1.956 m)  Wt 134.4 kg (296 lb 4.8 oz)  BMI 35.14 kg/m2  SpO2 95%  General Exam: Comfortable.  Respiratory System: Clear. No increased work of breathing.  Cardiovascular System: First and second heart sounds heard. Regular rate and rhythm. No JVD/murmurs. Telemetry shows normal sinus rhythm.  Gastrointestinal System: Abdomen is non distended, soft and normal bowel sounds heard.  Central Nervous System: Alert and oriented. No focal neurological deficits.  Extremities: Symmetrical 5 x 5 power.   Basic Metabolic Panel:  Lab 03/14/12 4098 03/13/12 0440 03/11/12 0440  NA 138 135 137  K 3.6 4.1 3.7  CL 104 102 107  CO2 28 22 25   GLUCOSE 109* 155* 88  BUN 31* 30* 23  CREATININE 1.02 1.07 1.04  CALCIUM 9.0 9.9 9.3  ALB -- -- --  PHOS -- -- --   Liver Function Tests:  Lab 03/10/12 0730  AST 27  ALT 40  ALKPHOS 82  BILITOT 0.5  PROT 6.8  ALBUMIN 3.2*    Lab 03/10/12 0730  LIPASE 39  AMYLASE --   No results found for this basename: AMMONIA:3 in the last 168 hours CBC:  Lab 03/12/12 0510 03/11/12 0440 03/10/12 0734 03/10/12 0005  WBC 10.8* 10.3 5.3 --  NEUTROABS -- -- -- --  HGB 15.0 14.5 14.7 --  HCT 43.4 42.7 43.2 --  MCV 87.7 89.3 89.8 89.3  PLT 173 146* 141* --   Cardiac Enzymes:  Lab 03/10/12 2310 03/10/12 1523 03/10/12 0734  CKTOTAL 98 104 120  CKMB 2.8 3.0 3.3  CKMBINDEX -- -- --    TROPONINI <0.30 <0.30 <0.30   CBG: No results found for this basename: GLUCAP:5 in the last 168 hours  Other lab data:  1. LFTs on 4/2: Significant for albumin 3.2. Otherwise within normal limits. 2. Lipid panel: Cholesterol 189, triglyceride 62, HDL 55, LDL 122, VLDL 12. 3. INR today: 1.24. 4. D-dimer: 0.88. 5. TSH 1.929. 6. Urinalysis was negative for features of urinary tract infection. 7.    Ct Head Wo Contrast  03/10/2012  *RADIOLOGY REPORT*  Clinical Data: Elevated blood pressure.  Extremity swelling.  Upper neck pain, worse on the right.  CT HEAD WITHOUT CONTRAST  Technique:  Contiguous axial images were obtained from the base of the skull through the vertex without contrast.  Comparison: None.  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  No mass effect or midline shift.  No ventricular dilatation.  No abnormal extra-axial fluid collections.  Gray-white matter junctions are distinct.  Basal cisterns  are not effaced.  No evidence of acute intracranial hemorrhage.  No depressed skull fractures.  Visualized paranasal sinuses and mastoid air cells are not opacified.  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  No acute intracranial abnormalities demonstrated.  Original Report Authenticated By: Marlon Pel, M.D.   Nm Pulmonary Perfusion  03/10/2012  *RADIOLOGY REPORT*  Clinical Data: Iodine allergy.  Evaluate for pulmonary embolus. Chest pain, shortness of breath.  NM PULMONARY PERFUSION PARTICULATE  Radiopharmaceutical: CURIE MAA TECHNETIUM TO 45M ALBUMIN AGGREGATED  Comparison: Chest x-ray 03/10/2012  Findings: There are no photopenic defects on the perfusion study to suggest pulmonary embolus.  IMPRESSION: Normal perfusion study.  No evidence of pulmonary embolus.   Original Report Authenticated By: Cyndie Chime, M.D.   Nm Myocar Multi W/spect W/wall Motion / Ef  03/12/2012  *RADIOLOGY REPORT*  Clinical Data:  Obstructive CAD.  MYOCARDIAL IMAGING WITH SPECT (REST AND PHARMACOLOGIC-STRESS - 2 DAY PROTOCOL) GATED LEFT VENTRICULAR WALL MOTION STUDY LEFT VENTRICULAR EJECTION FRACTION  Technique:  Standard myocardial SPECT imaging was performed after intravenous injection of 30 mCi Tc-45m tetrofosmin at rest.  On a different day, intravenous infusion of  Lexiscan was performed under supervision of the Cardiology staff.  At peak effect of the drug, 30 mCi Tc-8m tetrofosmin was injected intravenously and standard myocardial SPECT imaging was performed.  Quantitative gated imaging was also performed to evaluate left ventricular wall motion and estimate left ventricular ejection fraction.  Comparison:  None  Findings: SPECT imaging demonstrates decreased activity in the anterior wall on the stress images compared to the rest images concerning for anterior ischemia.  Quantitative gated analysis shows normal wall motion.  The resting left ventricular ejection fraction is 47% with end- diastolic volume of 149 ml and end-systolic volume of 79 ml.  IMPRESSION: Findings concerning for inducible ischemia in the anterior wall.  Ejection fraction 47%.  Original Report Authenticated By: Cyndie Chime, M.D.   Dg Chest Port 1 View  03/10/2012  *RADIOLOGY REPORT*  Clinical Data: Shortness of breath.  History of sarcoidosis.  PORTABLE CHEST - 1 VIEW  Comparison: 08/29/2004  Findings: The shallow inspiration.  Heart size and pulmonary vascularity are normal for technique.  No focal airspace consolidation in the lungs.  No blunting of costophrenic angles. Mild prominence of the right hilar and right paratracheal structures consistent with lymphadenopathy.  This is stable since the previous study, given differences in technique.  No pneumothorax.  IMPRESSION: Probable right hilar and mediastinal  lymphadenopathy compatible with sarcoidosis.  No significant change since previous study.  No evidence of active infiltration.  Original Report Authenticated By: Marlon Pel, M.D.   2-D echocardiogram  Study Conclusions  - Left ventricle: The cavity size was normal. There was mild concentric hypertrophy. Systolic function was vigorous. The estimated ejection fraction was in the range of 65% to 70%. Wall motion was normal; there were no regional wall motion abnormalities. Doppler parameters are consistent with abnormal left ventricular relaxation (grade 1 diastolic dysfunction). - Left atrium: The atrium was mildly dilated. - Atrial septum: No defect or patent foramen ovale was identified.  Lower extremity venous Dopplers:  Summary:  - No evidence of deep vein thrombosis involving the right lower extremity and left lower extremity. - No evidence of Baker's cyst on the right or left.     Disposition: Discharged home in stable condition.   Diet:  heart healthy  Activity: increase activity gradually  Follow-up Appts: Discharge Orders    Future Appointments: Provider: Department: Dept Phone: Center:   03/18/2012 9:30 AM [facility] [phone] None   03/24/2012 10:10 AM Beatrice Lecher, PA [facility] [place] [phone] [facility]   03/27/2012 10:15 AM Barbaraann Share, MD [facility] [phone] None     Future Orders Please Complete By Expires   Diet - low sodium heart healthy      Increase activity slowly      Call MD for:  difficulty breathing, headache or visual disturbances         TESTS THAT NEED FOLLOW-UP  PT and INR on 03/18/2012 with [facility].   Arranged for outpatient followup with [facility], cardiologist and outpatient consultation with [facility] pulmonologist.  Time spent on discharge, talking to the patient, and coordinating care:  45 mins.   SignedMarcellus Scott, MD 03/16/2012,  3:20 PM

## 2012-03-16 NOTE — Progress Notes (Signed)
SUBJECTIVE:  Denies CP or SOB  OBJECTIVE:   Vitals:   Filed Vitals:   03/15/12 0953 03/15/12 1339 03/15/12 1752 03/15/12 2103  BP: 120/86 129/86 132/81 128/74  Pulse:  64 76   Temp:  98.2 F (36.8 C)    TempSrc:  Oral  Oral  Resp:  18  20  Height:      Weight:      SpO2:  99%  99%   I&O's:    Intake/Output Summary (Last 24 hours) at 03/16/12 0724 Last data filed at 03/16/12 0529  Gross per 24 hour  Intake   1080 ml  Output      0 ml  Net   1080 ml   TELEMETRY: Reviewed telemetry pt in NSR     PHYSICAL EXAM General: Well developed, well nourished, in no acute distress Head: Normal Lungs:   Clear bilaterally to auscultation and percussion. Heart:   HRRR S1 S2  Abdomen: Bowel sounds are positive, abdomen soft and non-tender without masses  Extremities:   No edema.   Neuro: Alert and oriented X 3.    LABS: Basic Metabolic Panel:  Basename 03/14/12 0944  NA 138  K 3.6  CL 104  CO2 28  GLUCOSE 109*  BUN 31*  CREATININE 1.02  CALCIUM 9.0  MG --  PHOS --   No results found for this basename: CHOL,HDL,LDLCALC,TRIG,CHOLHDL,LDLDIRECT in the last 72 hours Coag Panel:   Lab Results  Component Value Date   INR 1.24 03/16/2012   INR 1.05 03/15/2012   INR 0.99 03/14/2012         ASSESSMENT:  1. Chest pain with abnormal stress myoview and normal coronary arteries by cath  2. 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 HTN now controlled 4. Sarcoidosis    PLAN:   1.  Continue current cardiac meds (would DC ASA as coumadin has been initiated per Dr Elease Hashimoto for apical aneurysm). Check INR in coumadin clinic Wed 4/10; dc on 5 mg daily. BP now controlled. FU Dr Shirlee Latch 1-2 weeks. Patient states he cannot have cardiac MRI due to claustrophobia; he needs this to R/O cardiac sarcoid. Further discussions with Dr Shirlee Latch at [facility]. Needs FU with pulmonary for sarcoid.  Olga Millers, MD  03/16/2012  7:24 AM

## 2012-03-18 ENCOUNTER — Ambulatory Visit (INDEPENDENT_AMBULATORY_CARE_PROVIDER_SITE_OTHER): Payer: Self-pay | Admitting: *Deleted

## 2012-03-18 DIAGNOSIS — Z7901 Long term (current) use of anticoagulants: Secondary | ICD-10-CM | POA: Insufficient documentation

## 2012-03-18 DIAGNOSIS — D869 Sarcoidosis, unspecified: Secondary | ICD-10-CM

## 2012-03-18 NOTE — Patient Instructions (Signed)

## 2012-03-23 ENCOUNTER — Encounter: Payer: Self-pay | Admitting: *Deleted

## 2012-03-24 ENCOUNTER — Ambulatory Visit (INDEPENDENT_AMBULATORY_CARE_PROVIDER_SITE_OTHER): Payer: Self-pay

## 2012-03-24 ENCOUNTER — Ambulatory Visit (INDEPENDENT_AMBULATORY_CARE_PROVIDER_SITE_OTHER): Payer: Self-pay | Admitting: Physician Assistant

## 2012-03-24 ENCOUNTER — Telehealth: Payer: Self-pay | Admitting: Cardiology

## 2012-03-24 ENCOUNTER — Encounter: Payer: Self-pay | Admitting: Physician Assistant

## 2012-03-24 VITALS — BP 109/78 | HR 80 | Ht 76.0 in | Wt 296.0 lb

## 2012-03-24 DIAGNOSIS — Z7901 Long term (current) use of anticoagulants: Secondary | ICD-10-CM

## 2012-03-24 DIAGNOSIS — R0609 Other forms of dyspnea: Secondary | ICD-10-CM

## 2012-03-24 DIAGNOSIS — D869 Sarcoidosis, unspecified: Secondary | ICD-10-CM

## 2012-03-24 DIAGNOSIS — I253 Aneurysm of heart: Secondary | ICD-10-CM | POA: Insufficient documentation

## 2012-03-24 DIAGNOSIS — I10 Essential (primary) hypertension: Secondary | ICD-10-CM

## 2012-03-24 LAB — BASIC METABOLIC PANEL
CO2: 26 mEq/L (ref 19–32)
Calcium: 9.4 mg/dL (ref 8.4–10.5)
Glucose, Bld: 99 mg/dL (ref 70–99)
Sodium: 139 mEq/L (ref 135–145)

## 2012-03-24 NOTE — Patient Instructions (Signed)
LAB TODAY BMET, BNP 786.05, 401.1  DR. MCLEAN APPT FOR APPROX 2 WEEKS

## 2012-03-24 NOTE — Progress Notes (Signed)
[address] [place], [place]  [address] Phone: [phone] Fax:  [phone]  Date:  03/24/2012   Name:  Tony Howard       DOB:  [birth year] MRN:  [id number]  PCP:  Burtis Junes, MD, MD  Primary Cardiologist:  Dr. [name]  Primary Electrophysiologist:  None    History of Present Illness: Tony Howard is a 51 y.o. male who presents for post hospital follow up.   He has a h/o pulmonary and cutaneous sarcoid, HTN, prior stroke and DJD.  He was admitted 4/1-4/8 with chest pain and elevated BP (190/120).  He was not taking any medications.  Head CT demonstrated encephalomalacia c/w old infarct in the distribution of the right PCA.  DDimer was elevated and V/Q scan was neg for pulmonary embolism.  CXR was c/w sarcoidosis with hilar adenopathy.  EF was normal on echo.  MI was ruled out.  Myoview was abnormal with EF 47% and anterior ischemia.  ECG was abnl with ant and inf TW inversions.  He was seen by Dr. [name].  LHC was recommended.  LHC 03/13/12:  Normal cors, EF 60-65% with small but definite apical aneurysm.  He was started on coumadin as thrombus formation was a high probability.  MRI was recommended if no obstructive CAD noted on Cath.  But, patient noted he would be intolerant due to claustrophobia.  Therefore, no MRI was done prior to discharge.  OP pulmonary follow up was arranged for his sarcoidosis.    2D Echocardiogram 03/10/12: Study Conclusions  - Left ventricle: The cavity size was normal. There was mild concentric hypertrophy. Systolic function was vigorous. The estimated ejection fraction was in the range of 65% to 70%. Wall motion was normal; there were no regional wall motion abnormalities. Doppler parameters are consistent with abnormal left ventricular relaxation (grade 1 diastolic dysfunction). - Left atrium: The atrium was mildly dilated. - Atrial septum: No defect or patent foramen ovale was identified.  Overall  doing well.  No chest pain.  No edema, PND.  Has noted some DOE.  Feels like he is weak.  Sleeps on 2 pillows now.  Taking all meds.  No syncope.  Sees Dr. Shelle Iron later this week.  No bleeding problems.  Has INR check today with coumadin clinic.    Past Medical History  Diagnosis Date  . Chronic pain   . Hypertension     echo 4/13:  mild LVH, EF 65-70%, grade 1 diast dysfxn, mild LAE  . Osteoarthritis   . Sarcoidosis of lung     Myoview 3/13 with EF 47%; anterior ischemia;  LHC without CAD;  MRI recommended to r/o cardiac sarcoid - but patient claustrophobic and MRI not done  . Aneurysm of heart     LHC 03/13/12:  Normal cors, EF 60-65% with small but definite apical aneurysm.  He was started on coumadin as thrombus formation was a high probability  . History of stroke     Current Outpatient Prescriptions  Medication Sig Dispense Refill  . carvedilol (COREG) 12.5 MG tablet Take 1 tablet (12.5 mg total) by mouth 2 (two) times daily with a meal.  60 tablet  0  . hydrochlorothiazide (HYDRODIURIL) 25 MG tablet Take 1 tablet (25 mg total) by mouth daily.  30 tablet  0  . lisinopril (PRINIVIL,ZESTRIL) 30 MG tablet Take 1 tablet (30 mg total) by mouth daily.  30 tablet  0  . warfarin (COUMADIN) 5 MG tablet Take  2 tablets (10 mg total) by mouth daily at 6 PM.  60 tablet  0    Allergies: Allergies  Allergen Reactions  . Naprosyn (Naproxen) Hives  . Iodine Hives and Rash    shellfish    History  Substance Use Topics  . Smoking status: Never Smoker   . Smokeless tobacco: Never Used  . Alcohol Use: No     ROS:  Please see the history of present illness.   All other systems reviewed and negative.   PHYSICAL EXAM: VS:  BP 109/78  Pulse 80  Ht 6\' 4"  (1.93 m)  Wt 296 lb (134.265 kg)  BMI 36.03 kg/m2 Well nourished, well developed, in no acute distress HEENT: normal Neck: no JVD Cardiac:  normal S1, S2; RRR; no murmur Lungs:  clear to auscultation bilaterally, no wheezing, rhonchi or  rales Abd: soft, nontender, no hepatomegaly Ext: no edema; right groin without hematoma or bruit  Skin: warm and dry Neuro:  CNs 2-12 intact, no focal abnormalities noted  EKG:  NSR, HR 80, LAD, non specific ST-T changes  ASSESSMENT AND PLAN:  1. LV Apical Aneurysm  Continue coumadin which is managed by our CVRR clinic.   2. HTN (hypertension)  Controlled.  Continue current therapy.  Check BMET today.     3. DOE (dyspnea on exertion)  May be related to sarcoid.  He does not look volume overloaded.  Does have diastolic dysfunction.  Check BNP today.  Add Lasix if needed.     4. Sarcoidosis  He sees Dr. Shelle Iron this week.  He cannot tolerate the MRI to look for cardiac sarcoid.  Defect on myoview could suggest this, but would show up as scar, not ischemia.  I have already d/w Dr. [name].  No other test to pursue at this time to evaluate for cardiac sarcoid.        Luna Glasgow, PA-C  10:32 AM 03/24/2012

## 2012-03-24 NOTE — Telephone Encounter (Signed)
Pt wants to know if he can take darvcet for pain with his heart meds

## 2012-03-24 NOTE — Telephone Encounter (Signed)
Spoke with pt and told him he should not take his wife's darvocet for pain.

## 2012-03-25 ENCOUNTER — Telehealth: Payer: Self-pay | Admitting: *Deleted

## 2012-03-25 DIAGNOSIS — I10 Essential (primary) hypertension: Secondary | ICD-10-CM

## 2012-03-25 NOTE — Telephone Encounter (Signed)
no answer and no machine to lm. I will try again later to reach pt to go over lab results and recommendations per Tereso Newcomer, [facility]

## 2012-03-25 NOTE — Telephone Encounter (Signed)
ptcb and is notified of lab results and to decrease HCTZ to 12.5 mg daily from 25 mg daily, repeat bmet 4/23. Danielle Rankin

## 2012-03-25 NOTE — Telephone Encounter (Signed)
Message copied by Tarri Fuller on Wed Mar 25, 2012 12:18 PM ------      Message from: [place], [place] T      Created: Tue Mar 24, 2012  3:31 PM       Decrease HCTZ to 12.5 mg QD      Repeat bmet in one week      Tereso Newcomer, PA-C  3:31 PM 03/24/2012

## 2012-03-25 NOTE — Telephone Encounter (Signed)
Message copied by Tarri Fuller on Wed Mar 25, 2012 10:43 AM ------      Message from: [place], [place] T      Created: Tue Mar 24, 2012  3:31 PM       Decrease HCTZ to 12.5 mg QD      Repeat bmet in one week      Tereso Newcomer, PA-C  3:31 PM 03/24/2012

## 2012-03-25 NOTE — Telephone Encounter (Signed)
Fu call °Patient returning your call °

## 2012-03-27 ENCOUNTER — Ambulatory Visit (INDEPENDENT_AMBULATORY_CARE_PROVIDER_SITE_OTHER): Payer: Self-pay | Admitting: Pulmonary Disease

## 2012-03-27 ENCOUNTER — Encounter: Payer: Self-pay | Admitting: Pulmonary Disease

## 2012-03-27 VITALS — BP 100/58 | HR 75 | Temp 97.9°F | Ht 77.0 in | Wt 297.6 lb

## 2012-03-27 DIAGNOSIS — R0609 Other forms of dyspnea: Secondary | ICD-10-CM

## 2012-03-27 DIAGNOSIS — D869 Sarcoidosis, unspecified: Secondary | ICD-10-CM

## 2012-03-27 NOTE — Assessment & Plan Note (Signed)
The patient has dyspnea on exertion that has been progressive over the last one year.  He also is morbidly obese and has a history of sarcoidosis.  It is unclear whether sarcoid is playing any role in his current symptoms.  He does not have any obvious parenchymal involvement on chest x-ray, and his lungs are totally clear.  We'll do a CT of his chest for better evaluation, and will also schedule for full pulmonary function studies.  The patient has gained a very significant amount of weight over the last one year, and I've asked him to work aggressively on weight loss and conditioning.

## 2012-03-27 NOTE — Progress Notes (Signed)
  Subjective:    Patient ID: Tony Howard, male    DOB: 02-Feb-1961, 51 y.o.   MRN: [id number]  HPI The patient is a 51 year old male who I have been asked to see for dyspnea on exertion.  He has had shortness of breath for years, but feels that it is getting worse.  He currently describes a 2 block dyspnea on exertion at a moderate pace on flat ground, and we'll also get winded bringing groceries in from the car.  He also gets short of breath walking one flight of stairs slowly.  He denies any significant cough or mucus production, but has had intermittent lower extremity edema.  The patient states that his weight is up well over 30 pounds over the last one year.  He has had a cardiac evaluation consisting of a catheterization this month that showed normal coronaries, normal ejection fraction, and a left apical aneurysm with clot.  He has been started on Coumadin.  He has had a recent chest x-ray that showed hilar lymphadenopathy, and a perfusion scan that was normal.  The patient states he has a diagnosis of sarcoid dating back to 2005, and was apparently diagnosed by skin biopsy.  He was treated by Dr. Alwyn Ren with a course of prednisone for a short period of time.   Review of Systems  Constitutional: Positive for unexpected weight change. Negative for fever.  HENT: Positive for rhinorrhea, sneezing, postnasal drip and sinus pressure. Negative for ear pain, nosebleeds, congestion, sore throat, trouble swallowing and dental problem.   Eyes: Positive for itching. Negative for redness.  Respiratory: Positive for cough, chest tightness, shortness of breath and wheezing.   Cardiovascular: Positive for palpitations and leg swelling.  Gastrointestinal: Positive for nausea. Negative for vomiting.  Genitourinary: Negative for dysuria.  Musculoskeletal: Positive for joint swelling.  Skin: Negative for rash.  Neurological: Negative for headaches.  Hematological: Does not bruise/bleed easily.    Psychiatric/Behavioral: Negative for dysphoric mood. The patient is nervous/anxious.        Objective:   Physical Exam Constitutional: obese male, no acute distress  HENT:  Nares patent without discharge  Oropharynx without exudate, palate and uvula are normal  Eyes:  Perrla, eomi, no scleral icterus  Neck:  No JVD, no TMG  Cardiovascular:  Normal rate, regular rhythm, no rubs or gallops.  No murmurs        Intact distal pulses  Pulmonary :  Normal breath sounds, no stridor or respiratory distress   No rales, rhonchi, or wheezing  Abdominal:  Soft, nondistended, bowel sounds present.  No tenderness noted.   Musculoskeletal:  mild lower extremity edema noted.  Lymph Nodes:  No cervical lymphadenopathy noted  Skin:  No cyanosis noted.  +patchy skin lesions unknown origin.   Neurologic:  Alert, appropriate, moves all 4 extremities without obvious deficit.         Assessment & Plan:

## 2012-03-27 NOTE — Patient Instructions (Signed)
Will schedule for ct scan of your chest to evaluate lung involvement with sarcoid. Will schedule for breathing studies, and see you back same day for review. Work on weight loss and conditioning.

## 2012-03-27 NOTE — Progress Notes (Signed)
Addended by: Gweneth Dimitri D on: 03/27/2012 11:05 AM   Modules accepted: Orders

## 2012-03-27 NOTE — Assessment & Plan Note (Signed)
The patient has a history of sarcoidosis, and he thinks this was established by skin biopsy in 2005.  His most recent chest x-ray does show hilar lymphadenopathy, but no obvious parenchymal involvement.  Will get his old chart from their dictation.  In the meantime, he will need a CT of his chest to evaluate for parenchymal lung involvement.

## 2012-03-31 ENCOUNTER — Ambulatory Visit (INDEPENDENT_AMBULATORY_CARE_PROVIDER_SITE_OTHER): Payer: Self-pay | Admitting: *Deleted

## 2012-03-31 DIAGNOSIS — Z7901 Long term (current) use of anticoagulants: Secondary | ICD-10-CM

## 2012-03-31 DIAGNOSIS — D869 Sarcoidosis, unspecified: Secondary | ICD-10-CM

## 2012-04-01 ENCOUNTER — Ambulatory Visit (INDEPENDENT_AMBULATORY_CARE_PROVIDER_SITE_OTHER)
Admission: RE | Admit: 2012-04-01 | Discharge: 2012-04-01 | Disposition: A | Discharge status: Home / Self Care | Payer: Self-pay | Source: Ambulatory Visit | Attending: Pulmonary Disease | Admitting: Pulmonary Disease

## 2012-04-01 DIAGNOSIS — D869 Sarcoidosis, unspecified: Secondary | ICD-10-CM

## 2012-04-07 ENCOUNTER — Ambulatory Visit (INDEPENDENT_AMBULATORY_CARE_PROVIDER_SITE_OTHER): Payer: Self-pay | Admitting: *Deleted

## 2012-04-07 DIAGNOSIS — D869 Sarcoidosis, unspecified: Secondary | ICD-10-CM

## 2012-04-07 DIAGNOSIS — Z7901 Long term (current) use of anticoagulants: Secondary | ICD-10-CM

## 2012-04-07 LAB — POCT INR: INR: 2.7

## 2012-04-08 ENCOUNTER — Telehealth: Payer: Self-pay | Admitting: Cardiology

## 2012-04-08 ENCOUNTER — Other Ambulatory Visit: Payer: Self-pay | Admitting: Internal Medicine

## 2012-04-08 DIAGNOSIS — I10 Essential (primary) hypertension: Secondary | ICD-10-CM

## 2012-04-08 MED ORDER — CARVEDILOL 12.5 MG PO TABS: 12.5000 mg | ORAL_TABLET | Freq: Two times a day (BID) | ORAL | Status: DC

## 2012-04-08 MED ORDER — WARFARIN SODIUM 5 MG PO TABS: ORAL_TABLET | ORAL | Status: DC

## 2012-04-08 MED ORDER — HYDROCHLOROTHIAZIDE 25 MG PO TABS: 12.5000 mg | ORAL_TABLET | Freq: Every day | ORAL | Status: DC

## 2012-04-08 MED ORDER — LISINOPRIL 30 MG PO TABS: 30.0000 mg | ORAL_TABLET | Freq: Every day | ORAL | Status: DC

## 2012-04-08 NOTE — Telephone Encounter (Signed)
Fu msg Pt is returning your call 

## 2012-04-08 NOTE — Telephone Encounter (Signed)
Spoke with pt. I did not call pt.

## 2012-04-14 ENCOUNTER — Telehealth: Payer: Self-pay | Admitting: Cardiology

## 2012-04-14 ENCOUNTER — Ambulatory Visit (INDEPENDENT_AMBULATORY_CARE_PROVIDER_SITE_OTHER): Payer: Self-pay | Admitting: Pulmonary Disease

## 2012-04-14 ENCOUNTER — Encounter: Payer: Self-pay | Admitting: Pulmonary Disease

## 2012-04-14 VITALS — BP 98/58 | HR 32 | Temp 97.3°F | Ht 77.0 in | Wt 304.0 lb

## 2012-04-14 DIAGNOSIS — R0989 Other specified symptoms and signs involving the circulatory and respiratory systems: Secondary | ICD-10-CM

## 2012-04-14 DIAGNOSIS — D869 Sarcoidosis, unspecified: Secondary | ICD-10-CM

## 2012-04-14 DIAGNOSIS — R0609 Other forms of dyspnea: Secondary | ICD-10-CM

## 2012-04-14 LAB — PULMONARY FUNCTION TEST

## 2012-04-14 NOTE — Progress Notes (Signed)
  Subjective:    Patient ID: Tony Howard, male    DOB: [birth year], 51 y.o.   MRN: [id number]  HPI Patient comes in today for followup of his recent PFTs and CT chest.  He has a long-standing diagnosis of sarcoidosis, but has not had a recent evaluation.  He has been having dyspnea on exertion I suspect is related to his weight and conditioning.  His CT chest showed hilar and mediastinal lymphadenopathy, a few scattered pulmonary nodules, but no definite parenchymal involvement.  His memory function study showed no airflow obstruction, mild restriction, and a severe decrease in diffusion capacity that corrected to normal with alveolar volume adjustment.  I have reviewed his studies with him, and answered all of his questions.   Review of Systems  Constitutional: Positive for fatigue. Negative for fever and unexpected weight change.  HENT: Negative for ear pain, nosebleeds, congestion, sore throat, rhinorrhea, sneezing, trouble swallowing, dental problem, postnasal drip and sinus pressure.   Eyes: Negative for redness and itching.  Respiratory: Negative for cough, chest tightness, shortness of breath and wheezing.   Cardiovascular: Negative for palpitations and leg swelling.  Gastrointestinal: Negative for nausea and vomiting.  Genitourinary: Negative for dysuria.  Musculoskeletal: Positive for joint swelling.  Skin: Negative for rash.  Neurological: Positive for dizziness, weakness and light-headedness. Negative for headaches.  Hematological: Does not bruise/bleed easily.  Psychiatric/Behavioral: Negative for dysphoric mood. The patient is not nervous/anxious.        Objective:   Physical Exam Overweight male in no acute distress Nose without purulence or discharge noted Chest is clear to auscultation Cardiac exam with bradycardic rhythm but regular Lower extremities with mild edema, no cyanosis Alert and oriented, moves all 4 extremities.       Assessment & Plan:  \

## 2012-04-14 NOTE — Telephone Encounter (Signed)
Pt had bp checked today and bp was low, was told be dr clance to call here, pls call [phone]

## 2012-04-14 NOTE — Assessment & Plan Note (Signed)
The patient had no parenchymal lung involvement with sarcoidosis on his CT chest, and therefore unlikely this is the etiology for his dyspnea on exertion.  He did have mild restriction that I suspect is due to his obesity, and his diffusion capacity was moderate to severely reduced but corrected with his alveolar volume adjustment.  I suspect that his shortness of breath is due to his significant weight gain and conditioning, but I cannot 100% exclude a contribution from sarcoid.  I have outlined to treatment paps, the first being more conservative and involves working on weight loss and conditioning.  The other would be a short trial of prednisone to see if he notices a difference in his breathing.  After a long discussion, the patient has decided that he does not want to go on prednisone unless absolutely necessary.  I think this is the right decision.  He will start working more aggressively on weight loss and conditioning, and will followup with me in 6 months.

## 2012-04-14 NOTE — Progress Notes (Signed)
PFT done today. 

## 2012-04-14 NOTE — Telephone Encounter (Signed)
Patient called was told received a message from Dr.Mclean he advised to decrease coreg to 6.25 mg twice daily.Appointment scheduled with Norma Fredrickson NP 04/15/12.

## 2012-04-14 NOTE — Patient Instructions (Signed)
Work hard on weight loss and your conditioning Please call your heart doctor and let them know your blood pressure and heart rate are a little low.  I will send a message also.  followup with me in 6mos, but call if your breathing worsens.

## 2012-04-15 ENCOUNTER — Ambulatory Visit (INDEPENDENT_AMBULATORY_CARE_PROVIDER_SITE_OTHER): Payer: Self-pay | Admitting: Nurse Practitioner

## 2012-04-15 ENCOUNTER — Encounter: Payer: Self-pay | Admitting: Nurse Practitioner

## 2012-04-15 DIAGNOSIS — R001 Bradycardia, unspecified: Secondary | ICD-10-CM | POA: Insufficient documentation

## 2012-04-15 DIAGNOSIS — I498 Other specified cardiac arrhythmias: Secondary | ICD-10-CM

## 2012-04-15 DIAGNOSIS — I959 Hypotension, unspecified: Secondary | ICD-10-CM

## 2012-04-15 DIAGNOSIS — I10 Essential (primary) hypertension: Secondary | ICD-10-CM

## 2012-04-15 DIAGNOSIS — Z7901 Long term (current) use of anticoagulants: Secondary | ICD-10-CM

## 2012-04-15 LAB — BASIC METABOLIC PANEL
BUN: 26 mg/dL — ABNORMAL HIGH (ref 6–23)
CO2: 28 mEq/L (ref 19–32)
Calcium: 9.5 mg/dL (ref 8.4–10.5)
Chloride: 107 mEq/L (ref 96–112)
Creatinine, Ser: 1.2 mg/dL (ref 0.4–1.5)
GFR: 85.53 mL/min (ref >60.00)
Glucose, Bld: 83 mg/dL (ref 70–99)
Potassium: 4.3 mEq/L (ref 3.5–5.1)
Sodium: 141 mEq/L (ref 135–145)

## 2012-04-15 LAB — CBC WITH DIFFERENTIAL/PLATELET
Basophils Absolute: 0 10*3/uL (ref 0.0–0.1)
Basophils Relative: 0.3 % (ref 0.0–3.0)
Eosinophils Absolute: 0.3 10*3/uL (ref 0.0–0.7)
Eosinophils Relative: 6.8 % — ABNORMAL HIGH (ref 0.0–5.0)
HCT: 40.7 % (ref 39.0–52.0)
Hemoglobin: 13.8 g/dL (ref 13.0–17.0)
Lymphocytes Relative: 32 % (ref 12.0–46.0)
Lymphs Abs: 1.5 10*3/uL (ref 0.7–4.0)
MCHC: 34 g/dL (ref 30.0–36.0)
MCV: 89.9 fl (ref 78.0–100.0)
Monocytes Absolute: 0.6 10*3/uL (ref 0.1–1.0)
Monocytes Relative: 11.8 % (ref 3.0–12.0)
Neutro Abs: 2.3 10*3/uL (ref 1.4–7.7)
Neutrophils Relative %: 49.1 % (ref 43.0–77.0)
Platelets: 129 10*3/uL — ABNORMAL LOW (ref 150.0–400.0)
RBC: 4.53 Mil/uL (ref 4.22–5.81)
RDW: 13.3 % (ref 11.5–14.6)
WBC: 4.8 10*3/uL (ref 4.5–10.5)

## 2012-04-15 NOTE — Assessment & Plan Note (Signed)
He was noted to have heart rate of 32 documented at his visit yesterday. He says Dr. Shelle Iron checked him and his rate was in the 40's. He is totally asymptomatic. He is now on a lower dose of Coreg. His wife is a CNA and can monitor for [place]. He has follow up planned in June with Dr. Shirlee Latch.

## 2012-04-15 NOTE — Assessment & Plan Note (Signed)
He remains on his coumadin. No adverse problems reported.

## 2012-04-15 NOTE — Patient Instructions (Addendum)
Stay on the half dose of Coreg.  Have your wife monitor your blood pressure and heart rate and keep a diary.   Keep your appointment with Dr. Shirlee Latch in June  We are going to check some labs today.  Call the [facility] office at [phone] if you have any questions, problems or concerns.

## 2012-04-15 NOTE — Progress Notes (Signed)
Tony Howard Date of Birth: [birth year] Medical Record #[id number]  History of Present Illness: Tony Howard is seen today for a work in visit. He is seen for Dr. Shirlee Latch. He has pulmonary and cutaneous sarcoid followed by Dr. Shelle Iron. Other issues include HTN, prior stroke, DJD, prior stroke. He is on coumadin for high probability of thrombus formation due to apical aneurysm. He does not have CAD. EF is normal by echo and cath, but was 47% by Myoview. He has had a history of noncompliance.   He comes in today. He is here alone. He was seen by Dr. Shelle Iron yesterday for his sarcoid and dyspnea. Blood pressure and heart rate noted to be low. Coreg was cut back to 6.25 mg BID. He was totally asymptomatic. He denies being lightheaded or dizzy. No syncope. No chest pain. No problems voiced with his coumadin. No signs of bleeding. He says he is really not all that short of breath and tries to stay active. 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 He is wanting to figure out a way to lose weight. He does try to walk 2 x a week. His wife is a CNA and can monitor his blood pressure and pulse.   Current Outpatient Prescriptions on File Prior to Visit  Medication Sig Dispense Refill  . hydrochlorothiazide (HYDRODIURIL) 25 MG tablet Take 0.5 tablets (12.5 mg total) by mouth daily.  30 tablet  6  . lisinopril (PRINIVIL,ZESTRIL) 30 MG tablet Take 1 tablet (30 mg total) by mouth daily.  30 tablet  6  . warfarin (COUMADIN) 5 MG tablet Take as directed by Coumadin Clinic  60 tablet  3  . DISCONTD: carvedilol (COREG) 12.5 MG tablet Take 1 tablet (12.5 mg total) by mouth 2 (two) times daily with a meal.  60 tablet  6    Allergies  Allergen Reactions  . Naprosyn (Naproxen) Hives  . Iodine Hives and Rash    shellfish    Past Medical History  Diagnosis Date  . Chronic pain   . Hypertension     echo 4/13:  mild LVH, EF 65-70%, grade 1 diast dysfxn, mild LAE  . Osteoarthritis   . Sarcoidosis of lung     Myoview 3/13  with EF 47%; anterior ischemia;  LHC without CAD;  MRI recommended to r/o cardiac sarcoid - but patient claustrophobic and MRI not done  . Aneurysm of heart     LHC 03/13/12:  Normal cors, EF 60-65% with small but definite apical aneurysm.  He was started on coumadin as thrombus formation was a high probability  . History of stroke   . Cutaneous sarcoidosis   . CVA (cerebral infarction)     Head CT showing encephalomalacia c/w old infarct in the distribution of the right PCA in April 2013  . DJD (degenerative joint disease)     Past Surgical History  Procedure Date  . Knee surgery   . Cystoscopy w/ ureteral stent placement     History  Smoking status  . Never Smoker   Smokeless tobacco  . Never Used    History  Alcohol Use  . Yes    3 everys every 2 weeks    Family History  Problem Relation Age of Onset  . Cancer Mother     lung  . Multiple sclerosis Sister     Review of Systems: The review of systems is per the HPI.  All other systems were reviewed and are negative.  Physical Exam: BP 102/80  Pulse 64  Ht 6\' 5"  (1.956 m)  Wt 299 lb 3.2 oz (135.716 kg)  BMI 35.48 kg/m2 Patient is very pleasant and in no acute distress. He is a large man. Skin is warm and dry. Color is normal.  HEENT is unremarkable. Normocephalic/atraumatic. PERRL. Sclera are nonicteric. Neck is supple. No masses. No JVD. Lungs are clear. Cardiac exam shows a regular rate and rhythm. Abdomen is soft. Extremities are without edema. Gait and ROM are intact. No gross neurologic deficits noted.   LABORATORY DATA: BMET and CBC are pending  EKG today shows sinus rhythm. PVC noted. Has LVH.    Assessment / Plan:

## 2012-04-15 NOTE — Assessment & Plan Note (Signed)
Blood pressure is ok today. He is on a lower dose of his Coreg. Will have his wife monitor for [place]. Will see him back in June as scheduled. We have discussed ways of weight loss (drink only water, no white foods, portion control, etc). I have also encouraged him to try and walk 4 to 5 x per week if possible. Patient is agreeable to this plan and will call if any problems develop in the interim.

## 2012-04-22 ENCOUNTER — Ambulatory Visit: Payer: Self-pay | Admitting: Nurse Practitioner

## 2012-04-23 ENCOUNTER — Ambulatory Visit (INDEPENDENT_AMBULATORY_CARE_PROVIDER_SITE_OTHER): Payer: Self-pay | Admitting: *Deleted

## 2012-04-23 ENCOUNTER — Ambulatory Visit (INDEPENDENT_AMBULATORY_CARE_PROVIDER_SITE_OTHER): Payer: Self-pay | Admitting: Nurse Practitioner

## 2012-04-23 ENCOUNTER — Encounter: Payer: Self-pay | Admitting: Nurse Practitioner

## 2012-04-23 VITALS — BP 110/94 | HR 64 | Ht 77.0 in | Wt 296.0 lb

## 2012-04-23 DIAGNOSIS — R609 Edema, unspecified: Secondary | ICD-10-CM

## 2012-04-23 DIAGNOSIS — R001 Bradycardia, unspecified: Secondary | ICD-10-CM

## 2012-04-23 DIAGNOSIS — D869 Sarcoidosis, unspecified: Secondary | ICD-10-CM

## 2012-04-23 DIAGNOSIS — Z7901 Long term (current) use of anticoagulants: Secondary | ICD-10-CM

## 2012-04-23 DIAGNOSIS — I10 Essential (primary) hypertension: Secondary | ICD-10-CM

## 2012-04-23 DIAGNOSIS — I498 Other specified cardiac arrhythmias: Secondary | ICD-10-CM

## 2012-04-23 LAB — POCT INR: INR: 2.4

## 2012-04-23 MED ORDER — HYDROCHLOROTHIAZIDE 25 MG PO TABS: 25.0000 mg | ORAL_TABLET | Freq: Every day | ORAL | Status: DC

## 2012-04-23 NOTE — Assessment & Plan Note (Signed)
Diastolics remain elevated. HCTZ is increased to a whole tablet. Will need repeat BMET on his next visit in June. His wife will continue to monitor.

## 2012-04-23 NOTE — Progress Notes (Signed)
Tony Howard Date of Birth: 1961/09/05 Medical Record #[id number]  History of Present Illness: Tony Howard is seen back today for a work in visit. He is seen for Dr. Shirlee Latch. He has pulmonary and cutaneous sarcoid followed by Dr. Shelle Iron. Other issues include HTN, prior stroke, DJD, and coumadin anticoagulation for high probability of thrombus formation due to apical aneurysm. He does not have known CAD. EF is normal by cath and echo but 47% by Myoview. He has had a history of noncompliance. I saw him just 10 days ago with a low heart rate and blood pressure noted by Dr. Shelle Iron. We cut his Coreg back. He was asymptomatic. He was to see Dr. Shirlee Latch next month.   He comes back today. He is here alone. He is complaining of swelling. It is just in the right leg. He thinks it has been going on for about a week. He is not sure. Thinks he may have twisted his knee. He has been trying to work under a car. Not short of breath. Otherwise feels good. Took my advice for losing weight and is already down 3 pounds. No chest pain. Not lightheaded or dizzy. His wife has been checking his blood pressure and heart rate at home. His diastolic remains up. He seems to be doing a better job of watching his salt.   Current Outpatient Prescriptions on File Prior to Visit  Medication Sig Dispense Refill  . carvedilol (COREG) 12.5 MG tablet Take 6.25 mg by mouth 2 (two) times daily with a meal.      . lisinopril (PRINIVIL,ZESTRIL) 30 MG tablet Take 1 tablet (30 mg total) by mouth daily.  30 tablet  6  . warfarin (COUMADIN) 5 MG tablet Take as directed by Coumadin Clinic  60 tablet  3  . DISCONTD: hydrochlorothiazide (HYDRODIURIL) 25 MG tablet Take 0.5 tablets (12.5 mg total) by mouth daily.  30 tablet  6    Allergies  Allergen Reactions  . Naprosyn (Naproxen) Hives  . Iodine Hives and Rash    shellfish    Past Medical History  Diagnosis Date  . Chronic pain   . Hypertension     echo 4/13:  mild LVH, EF 65-70%,  grade 1 diast dysfxn, mild LAE  . Osteoarthritis   . Sarcoidosis of lung     Myoview 3/13 with EF 47%; anterior ischemia;  LHC without CAD;  MRI recommended to r/o cardiac sarcoid - but patient claustrophobic and MRI not done  . Aneurysm of heart     LHC 03/13/12:  Normal cors, EF 60-65% with small but definite apical aneurysm.  He was started on coumadin as thrombus formation was a high probability  . History of stroke   . Cutaneous sarcoidosis   . CVA (cerebral infarction)     Head CT showing encephalomalacia c/w old infarct in the distribution of the right PCA in April 2013  . DJD (degenerative joint disease)     Past Surgical History  Procedure Date  . Knee surgery   . Cystoscopy w/ ureteral stent placement     History  Smoking status  . Never Smoker   Smokeless tobacco  . Never Used    History  Alcohol Use  . Yes    3 everys every 2 weeks    Family History  Problem Relation Age of Onset  . Cancer Mother     lung  . Lung cancer Mother   . Multiple sclerosis Sister     Review of  Systems: The review of systems is per the HPI.  All other systems were reviewed and are negative.  Physical Exam: BP 110/94  Pulse 64  Ht 6\' 5"  (1.956 m)  Wt 296 lb (134.265 kg)  BMI 35.10 kg/m2 Patient is very pleasant and in no acute distress. He is a large man. Skin is warm and dry. Color is normal.  HEENT is unremarkable. Normocephalic/atraumatic. PERRL. Sclera are nonicteric. Neck is supple. No masses. No JVD. Lungs are clear. Cardiac exam shows a regular rate and rhythm. Abdomen is soft. Extremities show just a trace amount of edema on the right. The left is without edema. Gait and ROM are intact. No gross neurologic deficits noted.   LABORATORY DATA: Lab Results  Component Value Date   INR 2.7 04/07/2012   INR 4.3 03/31/2012   INR 2.6 03/24/2012     Assessment / Plan:

## 2012-04-23 NOTE — Assessment & Plan Note (Signed)
Heart rate is ok. His wife will continue to monitor for [place].

## 2012-04-23 NOTE — Assessment & Plan Note (Signed)
He is going to elevate his leg. I do not think he has a DVT. He is on coumadin. He will continue with salt restriction. We will see him back in June as scheduled.

## 2012-04-23 NOTE — Patient Instructions (Signed)
Increase your HCTZ to a whole tablet.  See Dr. Shirlee Latch in June. You will have lab work at that visit.  Let your wife continue to monitor your blood pressure and heart rate.  Elevate your leg to help with the swelling.  Call the [facility] office at [phone] if you have any questions, problems or concerns.

## 2012-05-14 ENCOUNTER — Ambulatory Visit (INDEPENDENT_AMBULATORY_CARE_PROVIDER_SITE_OTHER): Payer: Self-pay | Admitting: Pharmacist

## 2012-05-14 DIAGNOSIS — I253 Aneurysm of heart: Secondary | ICD-10-CM

## 2012-05-14 DIAGNOSIS — Z7901 Long term (current) use of anticoagulants: Secondary | ICD-10-CM

## 2012-05-14 DIAGNOSIS — D869 Sarcoidosis, unspecified: Secondary | ICD-10-CM

## 2012-05-14 LAB — POCT INR: INR: 1.5

## 2012-05-18 ENCOUNTER — Ambulatory Visit: Payer: Self-pay | Admitting: Nurse Practitioner

## 2012-05-18 ENCOUNTER — Encounter: Payer: Self-pay | Admitting: *Deleted

## 2012-05-27 ENCOUNTER — Ambulatory Visit (INDEPENDENT_AMBULATORY_CARE_PROVIDER_SITE_OTHER): Payer: Self-pay | Admitting: *Deleted

## 2012-05-27 ENCOUNTER — Ambulatory Visit (INDEPENDENT_AMBULATORY_CARE_PROVIDER_SITE_OTHER): Payer: Self-pay | Admitting: Cardiology

## 2012-05-27 ENCOUNTER — Encounter: Payer: Self-pay | Admitting: Cardiology

## 2012-05-27 VITALS — BP 104/78 | HR 75 | Ht 77.0 in | Wt 298.0 lb

## 2012-05-27 DIAGNOSIS — I253 Aneurysm of heart: Secondary | ICD-10-CM

## 2012-05-27 DIAGNOSIS — R0609 Other forms of dyspnea: Secondary | ICD-10-CM

## 2012-05-27 DIAGNOSIS — Z7901 Long term (current) use of anticoagulants: Secondary | ICD-10-CM

## 2012-05-27 DIAGNOSIS — I10 Essential (primary) hypertension: Secondary | ICD-10-CM

## 2012-05-27 DIAGNOSIS — D869 Sarcoidosis, unspecified: Secondary | ICD-10-CM

## 2012-05-27 DIAGNOSIS — R0989 Other specified symptoms and signs involving the circulatory and respiratory systems: Secondary | ICD-10-CM

## 2012-05-27 MED ORDER — WARFARIN SODIUM 10 MG PO TABS: 10.0000 mg | ORAL_TABLET | ORAL | Status: DC

## 2012-05-27 NOTE — Patient Instructions (Addendum)
Your physician wants you to follow-up in: 1 year with Dr McLean. (June 2014).  You will receive a reminder letter in the mail two months in advance. If you don't receive a letter, please call our office to schedule the follow-up appointment.  

## 2012-05-27 NOTE — Assessment & Plan Note (Signed)
LV apical thrombus of uncertain etiology.  Coronaries were clean on cath.  Unable to evaluate with cardiac MRI due to claustrophobia.  Not typical presentation for cardiac sarcoidosis.  Given prior CVA, I think that continuing anticoagulation is reasonable.  He has been having a hard time affording coumadin but the pricing at his pharmacy sounds expensive.  I am going to have him talk to the coumadin clinic about finding this medication cheaper.  If he does stop coumadin (I recommended that he continue), he should at least take ASA 81 mg daily.

## 2012-05-27 NOTE — Progress Notes (Signed)
Patient ID: Tony Howard, male   DOB: 02-10-1961, 51 y.o.   MRN: [id number] PCP: Dr. 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  His main complaint in the past had been dyspnea.  He did not appear volume overloaded on exam and Dr. Shelle Iron did not think that his sarcoid was responsible for his exertional dyspnea (not much parenchymal disease on CT).   Patient has recently lose some weight.  His breathing is actually much better.  He can walk 1 block or a flight of steps without getting short of breath.  No further chest pain.  BP has been under control. He has had no bleeding problems with coumadin. He has been having trouble affording coumadin (no insurance).    PMH:  1. Sarcoidosis: Pulmonary and cutaneous.  CT chest in 4/13 showed hilar and mediastinal lymphadenopathy but no significant parenchymal involvement.  PFTs (4/13) showed mild restriction.  Patient follows with Dr. Shelle Iron.  2. HTN 3. OA hip with severe pain.  4. H/o CVA 5. Contrast allergy 6. Cardiomyopathy: Admitted in 4/13 with CP.  Myoview showed EF 47% with anterior ischemia. Echo showed EF 65%, apex poorly visualized.  LHC (4/13) showed no significant coronary disease but there was a small LV apical aneurysm.  We planned an MRI to evaluate but patient was too claustrophobic and could not do the study.    FH: Sister with multiple sclerosis.   SH: Married, nonsmoker, unemployed (used to work in [employer]).   ROS: All systems reviewed and negative except as per HPI.   Current Outpatient Prescriptions  Medication Sig Dispense Refill  . carvedilol (COREG) 12.5 MG tablet Take 6.25 mg by mouth 2 (two) times daily with a meal.      . hydrochlorothiazide (HYDRODIURIL) 25 MG tablet Take 1 tablet (25 mg total) by mouth daily.  30 tablet  6  . lisinopril (PRINIVIL,ZESTRIL) 30 MG tablet Take 1 tablet (30 mg total) by mouth daily.  30  tablet  6  . warfarin (COUMADIN) 5 MG tablet Take as directed by Coumadin Clinic  60 tablet  3  . warfarin (COUMADIN) 10 MG tablet Take 1 tablet (10 mg total) by mouth as directed. Take as directed by coumadin clinic  30 tablet  3    BP 104/78  Pulse 75  Ht 6\' 5"  (1.956 m)  Wt 135.172 kg (298 lb)  BMI 35.34 kg/m2 General: NAD, obese.  Neck: No JVD, no thyromegaly or thyroid nodule.  Lungs: Clear to auscultation bilaterally with normal respiratory effort. CV: Nondisplaced PMI.  Heart regular S1/S2, no S3/S4, no murmur.  No peripheral edema.  No carotid bruit.  Normal pedal pulses.  Abdomen: Soft, nontender, no hepatosplenomegaly, no distention.  Neurologic: Alert/oriented x 3 Psych: Normal affect. Extremities: No clubbing or cyanosis.

## 2012-05-27 NOTE — Assessment & Plan Note (Signed)
Improved with exercise.  Per last pulmonary note, doubt sarcoid is playing a major role in dyspnea due to minimal parenchymal disease and PFTs not severely abnormal.  He also has not appeared volume overloaded on exam.

## 2012-05-27 NOTE — Assessment & Plan Note (Signed)
BP now under good control.

## 2012-07-08 ENCOUNTER — Telehealth: Payer: Self-pay | Admitting: Cardiology

## 2012-07-08 NOTE — Telephone Encounter (Signed)
[phone] pt rtn call to anne re going to the hospital

## 2012-07-08 NOTE — Telephone Encounter (Signed)
I reviewed with Dr Shirlee Latch. Dr Shirlee Latch recommended pt report to ED for further evaluation. I spoke with pt's wife and she is aware of Dr Alford Highland recommendation.

## 2012-07-08 NOTE — Telephone Encounter (Signed)
Chest pains radiates back, started Monday.  Left leg swelling, doesn't seem to go down at night started yesterday. Increased shortness of breath and worse on exertion, didn't tell his wife until today.   Blood pressure reading prior to morning medications.  Sting was on right finger, no Benadryl.   Resting now.  Will forward

## 2012-07-08 NOTE — Telephone Encounter (Signed)
Will forward to Melida Quitter RN here working with Dr Shirlee Latch

## 2012-07-08 NOTE — Telephone Encounter (Signed)
Fu call °Pt's wife calling back again °

## 2012-07-08 NOTE — Telephone Encounter (Signed)
New problem:  Patient wife calling. C/O Right hand / left leg is swollen due to wasp sting on Monday.  Some Chest pain. B/p this am 140/ 100. Marland Kitchen

## 2012-07-31 ENCOUNTER — Ambulatory Visit (INDEPENDENT_AMBULATORY_CARE_PROVIDER_SITE_OTHER): Payer: Self-pay | Admitting: *Deleted

## 2012-07-31 DIAGNOSIS — Z7901 Long term (current) use of anticoagulants: Secondary | ICD-10-CM

## 2012-07-31 DIAGNOSIS — D869 Sarcoidosis, unspecified: Secondary | ICD-10-CM

## 2012-07-31 DIAGNOSIS — I253 Aneurysm of heart: Secondary | ICD-10-CM

## 2012-07-31 LAB — POCT INR: INR: 3.5

## 2012-10-13 ENCOUNTER — Other Ambulatory Visit: Payer: Self-pay | Admitting: Cardiology

## 2012-10-14 ENCOUNTER — Ambulatory Visit (INDEPENDENT_AMBULATORY_CARE_PROVIDER_SITE_OTHER): Payer: Self-pay | Admitting: *Deleted

## 2012-10-14 ENCOUNTER — Other Ambulatory Visit: Payer: Self-pay | Admitting: *Deleted

## 2012-10-14 DIAGNOSIS — D869 Sarcoidosis, unspecified: Secondary | ICD-10-CM

## 2012-10-14 DIAGNOSIS — I253 Aneurysm of heart: Secondary | ICD-10-CM

## 2012-10-14 DIAGNOSIS — Z7901 Long term (current) use of anticoagulants: Secondary | ICD-10-CM

## 2012-10-14 DIAGNOSIS — I10 Essential (primary) hypertension: Secondary | ICD-10-CM

## 2012-10-14 LAB — POCT INR: INR: 2.2

## 2012-10-14 MED ORDER — LISINOPRIL 30 MG PO TABS: 30.0000 mg | ORAL_TABLET | Freq: Every day | ORAL | Status: DC

## 2012-10-14 MED ORDER — HYDROCHLOROTHIAZIDE 25 MG PO TABS: ORAL_TABLET | ORAL | Status: DC

## 2012-10-14 MED ORDER — CARVEDILOL 12.5 MG PO TABS: 6.2500 mg | ORAL_TABLET | Freq: Two times a day (BID) | ORAL | Status: DC

## 2012-10-14 MED ORDER — WARFARIN SODIUM 10 MG PO TABS: 10.0000 mg | ORAL_TABLET | ORAL | Status: DC

## 2012-10-15 ENCOUNTER — Ambulatory Visit: Payer: Self-pay | Admitting: Pulmonary Disease

## 2012-11-10 ENCOUNTER — Ambulatory Visit: Payer: Self-pay | Admitting: Pulmonary Disease

## 2012-11-11 ENCOUNTER — Ambulatory Visit (INDEPENDENT_AMBULATORY_CARE_PROVIDER_SITE_OTHER): Payer: Self-pay | Admitting: *Deleted

## 2012-11-11 DIAGNOSIS — I253 Aneurysm of heart: Secondary | ICD-10-CM

## 2012-11-11 DIAGNOSIS — D869 Sarcoidosis, unspecified: Secondary | ICD-10-CM

## 2012-11-11 DIAGNOSIS — Z7901 Long term (current) use of anticoagulants: Secondary | ICD-10-CM

## 2012-11-11 MED ORDER — WARFARIN SODIUM 10 MG PO TABS: 10.0000 mg | ORAL_TABLET | ORAL | Status: DC

## 2012-12-16 ENCOUNTER — Ambulatory Visit (INDEPENDENT_AMBULATORY_CARE_PROVIDER_SITE_OTHER): Payer: Self-pay | Admitting: Pharmacist

## 2012-12-16 DIAGNOSIS — D869 Sarcoidosis, unspecified: Secondary | ICD-10-CM

## 2012-12-16 DIAGNOSIS — Z7901 Long term (current) use of anticoagulants: Secondary | ICD-10-CM

## 2013-02-22 ENCOUNTER — Ambulatory Visit (INDEPENDENT_AMBULATORY_CARE_PROVIDER_SITE_OTHER): Payer: Self-pay | Admitting: Pharmacist

## 2013-02-22 ENCOUNTER — Telehealth: Payer: Self-pay | Admitting: Pulmonary Disease

## 2013-02-22 DIAGNOSIS — Z7901 Long term (current) use of anticoagulants: Secondary | ICD-10-CM

## 2013-02-22 DIAGNOSIS — I253 Aneurysm of heart: Secondary | ICD-10-CM

## 2013-02-22 DIAGNOSIS — D869 Sarcoidosis, unspecified: Secondary | ICD-10-CM

## 2013-02-22 MED ORDER — WARFARIN SODIUM 10 MG PO TABS: 10.0000 mg | ORAL_TABLET | ORAL | Status: DC

## 2013-02-22 MED ORDER — WARFARIN SODIUM 5 MG PO TABS: ORAL_TABLET | ORAL | Status: DC

## 2013-02-22 NOTE — Telephone Encounter (Signed)
Verified with pt that Dr Alford Highland office takes care of refills on meds.  Pt verified understanding and stated that he has spoke with Dr Alford Highland office about refills.

## 2013-02-23 ENCOUNTER — Other Ambulatory Visit: Payer: Self-pay

## 2013-02-23 DIAGNOSIS — I10 Essential (primary) hypertension: Secondary | ICD-10-CM

## 2013-02-23 MED ORDER — HYDROCHLOROTHIAZIDE 25 MG PO TABS: ORAL_TABLET | ORAL | Status: DC

## 2013-02-23 MED ORDER — LISINOPRIL 30 MG PO TABS: 30.0000 mg | ORAL_TABLET | Freq: Every day | ORAL | Status: DC

## 2013-02-23 MED ORDER — CARVEDILOL 12.5 MG PO TABS: 6.2500 mg | ORAL_TABLET | Freq: Two times a day (BID) | ORAL | Status: DC

## 2013-02-23 NOTE — Telephone Encounter (Signed)
..   Requested Prescriptions   Signed Prescriptions Disp Refills  . carvedilol (COREG) 12.5 MG tablet 30 tablet 6    Sig: Take 0.5 tablets (6.25 mg total) by mouth 2 (two) times daily with a meal.    Authorizing Provider: Laurey Morale    Ordering User: Saxon Barich M  . hydrochlorothiazide (HYDRODIURIL) 25 MG tablet 30 tablet 6    Sig: 1/2 tablet daily    Authorizing Provider: Laurey Morale    Ordering User: Benjamen Koelling M  . lisinopril (PRINIVIL,ZESTRIL) 30 MG tablet 30 tablet 6    Sig: Take 1 tablet (30 mg total) by mouth daily.    Authorizing Provider: Laurey Morale    Ordering User: Christella Hartigan, Leigha Olberding Judie Petit

## 2013-03-08 ENCOUNTER — Ambulatory Visit (INDEPENDENT_AMBULATORY_CARE_PROVIDER_SITE_OTHER): Payer: Self-pay | Admitting: Pharmacist

## 2013-03-08 DIAGNOSIS — D869 Sarcoidosis, unspecified: Secondary | ICD-10-CM

## 2013-03-08 DIAGNOSIS — I253 Aneurysm of heart: Secondary | ICD-10-CM

## 2013-03-08 DIAGNOSIS — Z7901 Long term (current) use of anticoagulants: Secondary | ICD-10-CM

## 2013-05-04 ENCOUNTER — Encounter: Payer: Self-pay | Admitting: Cardiology

## 2013-06-28 ENCOUNTER — Ambulatory Visit: Payer: Self-pay | Admitting: Cardiology

## 2013-06-29 ENCOUNTER — Ambulatory Visit (INDEPENDENT_AMBULATORY_CARE_PROVIDER_SITE_OTHER): Payer: Self-pay | Admitting: *Deleted

## 2013-06-29 DIAGNOSIS — I253 Aneurysm of heart: Secondary | ICD-10-CM

## 2013-06-29 DIAGNOSIS — Z7901 Long term (current) use of anticoagulants: Secondary | ICD-10-CM

## 2013-06-29 DIAGNOSIS — D869 Sarcoidosis, unspecified: Secondary | ICD-10-CM

## 2013-06-29 MED ORDER — WARFARIN SODIUM 10 MG PO TABS: 10.0000 mg | ORAL_TABLET | ORAL | Status: DC

## 2013-07-05 ENCOUNTER — Ambulatory Visit (INDEPENDENT_AMBULATORY_CARE_PROVIDER_SITE_OTHER): Payer: Self-pay | Admitting: Cardiology

## 2013-07-05 ENCOUNTER — Encounter: Payer: Self-pay | Admitting: Cardiology

## 2013-07-05 VITALS — BP 128/82 | HR 68 | Ht 77.0 in | Wt 306.4 lb

## 2013-07-05 DIAGNOSIS — R001 Bradycardia, unspecified: Secondary | ICD-10-CM

## 2013-07-05 DIAGNOSIS — R079 Chest pain, unspecified: Secondary | ICD-10-CM

## 2013-07-05 DIAGNOSIS — I498 Other specified cardiac arrhythmias: Secondary | ICD-10-CM

## 2013-07-05 DIAGNOSIS — R0602 Shortness of breath: Secondary | ICD-10-CM

## 2013-07-05 DIAGNOSIS — R0609 Other forms of dyspnea: Secondary | ICD-10-CM

## 2013-07-05 DIAGNOSIS — I253 Aneurysm of heart: Secondary | ICD-10-CM

## 2013-07-05 MED ORDER — FUROSEMIDE 20 MG PO TABS: 20.0000 mg | ORAL_TABLET | Freq: Every day | ORAL | Status: DC

## 2013-07-05 MED ORDER — POTASSIUM CHLORIDE CRYS ER 10 MEQ PO TBCR: 10.0000 meq | EXTENDED_RELEASE_TABLET | Freq: Every day | ORAL | Status: DC

## 2013-07-05 NOTE — Patient Instructions (Addendum)
Stop HCTZ (hydrochlorothiazide).  Start lasix (furosemide) 20mg  daily.   Start KCL (potassium) 10 mEq daily.   Your physician recommends that you return for fasting lab work in: about 10 days-BMET/BNP/Lipid profile.     Your physician has requested that you have an echocardiogram. Echocardiography is a painless test that uses sound waves to create images of your heart. It provides your doctor with information about the size and shape of your heart and how well your heart's chambers and valves are working. This procedure takes approximately one hour. There are no restrictions for this procedure.  Your physician has requested that you have en exercise stress myoview. For further information please visit [email or web address] Please follow instruction sheet, as given.  Your physician recommends that you schedule a follow-up appointment in: 1 month with Dr Shirlee Latch.

## 2013-07-06 ENCOUNTER — Ambulatory Visit (HOSPITAL_COMMUNITY): Payer: Self-pay | Attending: Cardiology

## 2013-07-06 DIAGNOSIS — R0602 Shortness of breath: Secondary | ICD-10-CM

## 2013-07-06 DIAGNOSIS — I379 Nonrheumatic pulmonary valve disorder, unspecified: Secondary | ICD-10-CM | POA: Insufficient documentation

## 2013-07-06 DIAGNOSIS — I059 Rheumatic mitral valve disease, unspecified: Secondary | ICD-10-CM | POA: Insufficient documentation

## 2013-07-06 DIAGNOSIS — R0609 Other forms of dyspnea: Secondary | ICD-10-CM | POA: Insufficient documentation

## 2013-07-06 DIAGNOSIS — R0989 Other specified symptoms and signs involving the circulatory and respiratory systems: Secondary | ICD-10-CM | POA: Insufficient documentation

## 2013-07-06 DIAGNOSIS — R001 Bradycardia, unspecified: Secondary | ICD-10-CM

## 2013-07-06 DIAGNOSIS — I079 Rheumatic tricuspid valve disease, unspecified: Secondary | ICD-10-CM | POA: Insufficient documentation

## 2013-07-06 DIAGNOSIS — R072 Precordial pain: Secondary | ICD-10-CM | POA: Insufficient documentation

## 2013-07-06 DIAGNOSIS — I498 Other specified cardiac arrhythmias: Secondary | ICD-10-CM | POA: Insufficient documentation

## 2013-07-06 DIAGNOSIS — R079 Chest pain, unspecified: Secondary | ICD-10-CM

## 2013-07-06 NOTE — Progress Notes (Signed)
Patient ID: Tony Howard, male   DOB: [birth year], 52 y.o.   MRN: [id number] PCP: Dr. 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  His main complaint in the past had been dyspnea.  He did not appear volume overloaded on exam and Dr. Shelle Iron did not think that his sarcoid was responsible for his exertional dyspnea (not much parenchymal disease on CT).     I have not seen him in about a year.  At his prior appointment, he was doing well.  Since last appointment, he has gained 8 lbs.  He has had increasing exertional dyspnea in recent weeks.  He is short of breath after walking 50 feet and with housework.  No orthopnea/PND.  He has noted chest tightness with heavy lifting.  Most recent episode occurred while carrying a spare tire.  No stroke-like symptoms.    ECG: NSR, inferior and anterolateral T wave inversions  PMH:  1. Sarcoidosis: Pulmonary and cutaneous.  CT chest in 4/13 showed hilar and mediastinal lymphadenopathy but no significant parenchymal involvement.  PFTs (4/13) showed mild restriction.  Patient follows with Dr. Shelle Iron.  2. HTN 3. OA hip with severe pain.  4. H/o CVA 5. Contrast allergy 6. Cardiomyopathy: Admitted in 4/13 with CP.  Myoview showed EF 47% with anterior ischemia. Echo showed EF 65%, apex poorly visualized.  LHC (4/13) showed no significant coronary disease but there was a small LV apical aneurysm.  We planned an MRI to evaluate but patient was too claustrophobic and could not do the study.    FH: Sister with multiple sclerosis.   SH: Married, nonsmoker, unemployed (used to work in [employer]).   ROS: All systems reviewed and negative except as per HPI.   Current Outpatient Prescriptions  Medication Sig Dispense Refill  . carvedilol (COREG) 12.5 MG tablet Take 0.5 tablets (6.25 mg total) by mouth 2 (two) times daily with a meal.  30 tablet  6  . lisinopril (PRINIVIL,ZESTRIL) 30 MG  tablet Take 1 tablet (30 mg total) by mouth daily.  30 tablet  6  . warfarin (COUMADIN) 10 MG tablet Take 1 tablet (10 mg total) by mouth as directed.  35 tablet  2  . furosemide (LASIX) 20 MG tablet Take 1 tablet (20 mg total) by mouth daily.  30 tablet  3  . potassium chloride (K-DUR,KLOR-CON) 10 MEQ tablet Take 1 tablet (10 mEq total) by mouth daily.  30 tablet  3   No current facility-administered medications for this visit.    BP 128/82  Pulse 68  Ht 6\' 5"  (1.956 m)  Wt 138.982 kg (306 lb 6.4 oz)  BMI 36.33 kg/m2  SpO2 95% General: NAD, obese.  Neck: JVP 8 cm, no thyromegaly or thyroid nodule.  Lungs: Clear to auscultation bilaterally with normal respiratory effort. CV: Nondisplaced PMI.  Heart regular S1/S2, no S3/S4, no murmur.  Trace ankle edema.  No carotid bruit.  Normal pedal pulses.  Abdomen: Soft, nontender, no hepatosplenomegaly, no distention.  Neurologic: Alert/oriented x 3 Psych: Normal affect. Extremities: No clubbing or cyanosis.   Assessment/Plan: 1. Cardiomyopathy: Patient has been noted in the past to have a small apical aneurysm of uncertain cause.  He was unable to do a cardiac MRI due to claustrophobia.  He had a CVA in the past so is on chronic coumadin.  2. Exertional dyspnea: Patient has had increased exertional dyspnea over the last few weeks.  He  appears mildly volume overloaded on exam. He also has had episodes of exertional chest tightness.   - Stop HCTZ, start Lasix 20 mg daily + KCl 10 mEq daily. - BMET/BNP in 1 week.  - Echo (use contrast to try to visualized aneurysm).  - Given exertional symptoms, will arrange ETT-Sestamibi.   3. Hyperlipidemia: Check lipids.   [name] 07/06/2013

## 2013-07-06 NOTE — Progress Notes (Signed)
Echocardiogram performed.  

## 2013-07-13 ENCOUNTER — Encounter (HOSPITAL_COMMUNITY): Payer: Self-pay

## 2013-07-15 ENCOUNTER — Ambulatory Visit (HOSPITAL_COMMUNITY): Payer: Self-pay | Attending: Cardiology | Admitting: Radiology

## 2013-07-15 ENCOUNTER — Other Ambulatory Visit: Payer: Self-pay

## 2013-07-15 VITALS — BP 128/74 | HR 62 | Ht 78.0 in | Wt 297.0 lb

## 2013-07-15 DIAGNOSIS — Z8673 Personal history of transient ischemic attack (TIA), and cerebral infarction without residual deficits: Secondary | ICD-10-CM | POA: Insufficient documentation

## 2013-07-15 DIAGNOSIS — R9431 Abnormal electrocardiogram [ECG] [EKG]: Secondary | ICD-10-CM | POA: Insufficient documentation

## 2013-07-15 DIAGNOSIS — R5383 Other fatigue: Secondary | ICD-10-CM | POA: Insufficient documentation

## 2013-07-15 DIAGNOSIS — R42 Dizziness and giddiness: Secondary | ICD-10-CM | POA: Insufficient documentation

## 2013-07-15 DIAGNOSIS — E669 Obesity, unspecified: Secondary | ICD-10-CM | POA: Insufficient documentation

## 2013-07-15 DIAGNOSIS — R61 Generalized hyperhidrosis: Secondary | ICD-10-CM | POA: Insufficient documentation

## 2013-07-15 DIAGNOSIS — R079 Chest pain, unspecified: Secondary | ICD-10-CM

## 2013-07-15 DIAGNOSIS — R5381 Other malaise: Secondary | ICD-10-CM | POA: Insufficient documentation

## 2013-07-15 DIAGNOSIS — I10 Essential (primary) hypertension: Secondary | ICD-10-CM | POA: Insufficient documentation

## 2013-07-15 DIAGNOSIS — R0602 Shortness of breath: Secondary | ICD-10-CM

## 2013-07-15 DIAGNOSIS — R110 Nausea: Secondary | ICD-10-CM | POA: Insufficient documentation

## 2013-07-15 DIAGNOSIS — R001 Bradycardia, unspecified: Secondary | ICD-10-CM

## 2013-07-15 DIAGNOSIS — R0789 Other chest pain: Secondary | ICD-10-CM | POA: Insufficient documentation

## 2013-07-15 MED ORDER — TECHNETIUM TC 99M SESTAMIBI GENERIC - CARDIOLITE
10.0000 | Freq: Once | INTRAVENOUS | Status: AC | PRN
  Administered 2013-07-15: 10 via INTRAVENOUS

## 2013-07-15 MED ORDER — TECHNETIUM TC 99M SESTAMIBI GENERIC - CARDIOLITE
30.0000 | Freq: Once | INTRAVENOUS | Status: AC | PRN
  Administered 2013-07-15: 30 via INTRAVENOUS

## 2013-07-15 MED ORDER — REGADENOSON 0.4 MG/5ML IV SOLN
0.4000 mg | Freq: Once | INTRAVENOUS | Status: AC
  Administered 2013-07-15: 0.4 mg via INTRAVENOUS

## 2013-07-15 NOTE — Progress Notes (Addendum)
[facility] SITE 3 NUCLEAR MED [address] [place], [place] [address] [phone]    Cardiology Nuclear Med Study  Tony Howard is a 52 y.o. male     MRN : [id number]     DOB: [birth year]  Procedure Date: 07/15/2013  Nuclear Med Background Indication for Stress Test:  Evaluation for Ischemia and Abnormal EKG History: LV Aneursym of unknown cause; Cardiomyopathy; 2013 Myocardial Perfusion Study-inducible ischemia anteriorly, EF=47%> Cath> no significant CAD, EF=60-65%; 07-06-13 Echo: EF 55-60%, mild MR, TR Cardiac Risk Factors: CVA, Hypertension and Obesity  Symptoms: Chest Pain/Tightness with/without exertion (last occurrence yesterday),  Diaphoresis, DOE, Fatigue, Fatigue with Exertion, Light-Headedness, Nausea and SOB   Nuclear Pre-Procedure Caffeine/Decaff Intake:  None NPO After: 8:00pm   Lungs:  clear O2 Sat: 99% on room air. IV 0.9% NS with Angio Cath:  20g  IV Site: R Antecubital  IV Started by:  Bonnita Levan, RN  Chest Size (in):  50 Cup Size: n/a  Height: 6\' 6"  (1.981 m)  Weight:  297 lb (134.718 kg)  BMI:  Body mass index is 34.33 kg/(m^2). Tech Comments:  Held Coreg x 12 hrs    Nuclear Med Study 1 or 2 day study: 1 day  Stress Test Type:  Lexiscan  Reading MD: Cassell Clement, MD  Order Authorizing Provider:  [name], MD  Resting Radionuclide: Technetium 66m Sestamibi  Resting Radionuclide Dose: 11.0 mCi   Stress Radionuclide:  Technetium 36m Sestamibi  Stress Radionuclide Dose: 33.0 mCi           Stress Protocol Rest HR: 62 Stress HR: 113  Rest BP: 128/74 Stress BP: 156/88  Exercise Time (min): 2:00 METS: n/a   Predicted Max HR: 169 bpm % Max HR: 66.86 bpm Rate Pressure Product: 21308   Dose of Adenosine (mg):  n/a Dose of Lexiscan: 0.4 mg  Dose of Atropine (mg): n/a Dose of Dobutamine: n/a mcg/kg/min (at max HR)  Stress Test Technologist: Irean Hong, RN  Nuclear Technologist:  Domenic Polite, CNMT     Rest Procedure:   Myocardial perfusion imaging was performed at rest 45 minutes following the intravenous administration of Technetium 27m Sestamibi. Rest ECG: NSR with non-specific ST-T wave changes  Stress Procedure:  The patient received IV Lexiscan 0.4 mg over 15-seconds with concurrent low level exercise and then Technetium 37m Sestamibi was injected at 30-seconds while the patient continued walking one more minute. The patient complained of chest tightness, DOE, and stomach cramping with Lexiscan. Quantitative spect images were obtained after a 45-minute delay. Stress ECG: No significant change from baseline ECG  QPS Raw Data Images:  Normal; no motion artifact; normal heart/lung ratio. Stress Images:  Normal homogeneous uptake in all areas of the myocardium. Rest Images:  Normal homogeneous uptake in all areas of the myocardium. Subtraction (SDS):  No evidence of ischemia. Transient Ischemic Dilatation (Normal <1.22):  *n/a** Lung/Heart Ratio (Normal <0.45):  0.42  Quantitative Gated Spect Images QGS EDV:  118 ml QGS ESV:  62 ml  Impression Exercise Capacity:  Lexiscan with low level exercise. BP Response:  Normal blood pressure response. Clinical Symptoms:  Mild chest pain/dyspnea. ECG Impression:  There are scattered PVCs. Comparison with Prior Nuclear Study:  Since last study from 2013 the reversible anterior wall ischemia is no longer seen.  Overall Impression:  Low risk stress nuclear study.  No evidence of ischemia. EF of 47% is unchanged from prior study.  LV Ejection Fraction: 47%.  LV Wall Motion:  Normal Wall  Motion  [place]  EF 47% with no ischemia or infarction.  Would make sure he gets echo to confirm EF.   [name] 07/16/2013

## 2013-07-20 NOTE — Progress Notes (Signed)
Echo done 07/06/13 EF 55-60%.

## 2013-07-20 NOTE — Progress Notes (Signed)
Pt.notified

## 2013-07-23 ENCOUNTER — Other Ambulatory Visit (INDEPENDENT_AMBULATORY_CARE_PROVIDER_SITE_OTHER): Payer: Self-pay

## 2013-07-23 DIAGNOSIS — R079 Chest pain, unspecified: Secondary | ICD-10-CM

## 2013-07-23 DIAGNOSIS — I498 Other specified cardiac arrhythmias: Secondary | ICD-10-CM

## 2013-07-23 DIAGNOSIS — R0602 Shortness of breath: Secondary | ICD-10-CM

## 2013-07-23 DIAGNOSIS — R001 Bradycardia, unspecified: Secondary | ICD-10-CM

## 2013-07-23 LAB — LDL CHOLESTEROL, DIRECT: Direct LDL: 143.9 mg/dL

## 2013-07-23 LAB — BASIC METABOLIC PANEL
Calcium: 9.3 mg/dL (ref 8.4–10.5)
GFR: 65.26 mL/min (ref >60.00)
Glucose, Bld: 95 mg/dL (ref 70–99)
Potassium: 4 mEq/L (ref 3.5–5.1)
Sodium: 136 mEq/L (ref 135–145)

## 2013-07-23 LAB — BRAIN NATRIURETIC PEPTIDE: Pro B Natriuretic peptide (BNP): 10 pg/mL (ref 0.0–100.0)

## 2013-07-23 LAB — LIPID PANEL
HDL: 38.6 mg/dL — ABNORMAL LOW (ref >39.00)
Triglycerides: 164 mg/dL — ABNORMAL HIGH (ref 0.0–149.0)

## 2013-08-11 ENCOUNTER — Ambulatory Visit: Payer: Self-pay | Admitting: Cardiology

## 2014-03-03 ENCOUNTER — Other Ambulatory Visit: Payer: Self-pay

## 2014-03-03 ENCOUNTER — Other Ambulatory Visit: Payer: Self-pay | Admitting: Cardiology

## 2014-03-03 ENCOUNTER — Ambulatory Visit (INDEPENDENT_AMBULATORY_CARE_PROVIDER_SITE_OTHER): Payer: Self-pay | Admitting: *Deleted

## 2014-03-03 DIAGNOSIS — Z7902 Long term (current) use of antithrombotics/antiplatelets: Secondary | ICD-10-CM

## 2014-03-03 DIAGNOSIS — I253 Aneurysm of heart: Secondary | ICD-10-CM

## 2014-03-03 DIAGNOSIS — D869 Sarcoidosis, unspecified: Secondary | ICD-10-CM

## 2014-03-03 DIAGNOSIS — Z7901 Long term (current) use of anticoagulants: Secondary | ICD-10-CM

## 2014-03-03 LAB — POCT INR: INR: 2

## 2014-03-03 MED ORDER — CARVEDILOL 12.5 MG PO TABS
ORAL_TABLET | ORAL | Status: DC
Start: 2014-03-03 — End: 2014-03-18

## 2014-03-03 MED ORDER — WARFARIN SODIUM 10 MG PO TABS: ORAL_TABLET | ORAL | Status: DC

## 2014-03-03 MED ORDER — LISINOPRIL 30 MG PO TABS: 30.0000 mg | ORAL_TABLET | Freq: Every day | ORAL | Status: DC

## 2014-03-18 ENCOUNTER — Encounter: Payer: Self-pay | Admitting: Nurse Practitioner

## 2014-03-18 ENCOUNTER — Ambulatory Visit (INDEPENDENT_AMBULATORY_CARE_PROVIDER_SITE_OTHER): Payer: Self-pay | Admitting: Nurse Practitioner

## 2014-03-18 ENCOUNTER — Ambulatory Visit (INDEPENDENT_AMBULATORY_CARE_PROVIDER_SITE_OTHER): Payer: Self-pay | Admitting: Pharmacist

## 2014-03-18 VITALS — BP 180/90 | HR 76 | Ht 77.0 in | Wt 298.4 lb

## 2014-03-18 DIAGNOSIS — I498 Other specified cardiac arrhythmias: Secondary | ICD-10-CM

## 2014-03-18 DIAGNOSIS — Z7901 Long term (current) use of anticoagulants: Secondary | ICD-10-CM

## 2014-03-18 DIAGNOSIS — Z7902 Long term (current) use of antithrombotics/antiplatelets: Secondary | ICD-10-CM

## 2014-03-18 DIAGNOSIS — R079 Chest pain, unspecified: Secondary | ICD-10-CM

## 2014-03-18 DIAGNOSIS — D869 Sarcoidosis, unspecified: Secondary | ICD-10-CM

## 2014-03-18 DIAGNOSIS — R001 Bradycardia, unspecified: Secondary | ICD-10-CM

## 2014-03-18 DIAGNOSIS — R0602 Shortness of breath: Secondary | ICD-10-CM

## 2014-03-18 DIAGNOSIS — I253 Aneurysm of heart: Secondary | ICD-10-CM

## 2014-03-18 DIAGNOSIS — I10 Essential (primary) hypertension: Secondary | ICD-10-CM

## 2014-03-18 LAB — LIPID PANEL
Cholesterol: 238 mg/dL — ABNORMAL HIGH (ref 0–200)
HDL: 40.7 mg/dL (ref >39.00)
LDL Cholesterol: 168 mg/dL — ABNORMAL HIGH (ref 0–99)
Total CHOL/HDL Ratio: 6
Triglycerides: 148 mg/dL (ref 0.0–149.0)
VLDL: 29.6 mg/dL (ref 0.0–40.0)

## 2014-03-18 LAB — CBC
HCT: 47.3 % (ref 39.0–52.0)
Hemoglobin: 15.9 g/dL (ref 13.0–17.0)
MCHC: 33.5 g/dL (ref 30.0–36.0)
MCV: 90.8 fl (ref 78.0–100.0)
Platelets: 139 10*3/uL — ABNORMAL LOW (ref 150.0–400.0)
RBC: 5.22 Mil/uL (ref 4.22–5.81)
RDW: 13.5 % (ref 11.5–14.6)
WBC: 4.4 10*3/uL — ABNORMAL LOW (ref 4.5–10.5)

## 2014-03-18 LAB — HEPATIC FUNCTION PANEL
ALT: 35 U/L (ref 0–53)
AST: 31 U/L (ref 0–37)
Albumin: 3.4 g/dL — ABNORMAL LOW (ref 3.5–5.2)
Alkaline Phosphatase: 81 U/L (ref 39–117)
Bilirubin, Direct: 0 mg/dL (ref 0.0–0.3)
Total Bilirubin: 0.6 mg/dL (ref 0.3–1.2)
Total Protein: 7.2 g/dL (ref 6.0–8.3)

## 2014-03-18 LAB — POCT INR: INR: 2.2

## 2014-03-18 LAB — BASIC METABOLIC PANEL
BUN: 19 mg/dL (ref 6–23)
CO2: 28 mEq/L (ref 19–32)
Calcium: 9.6 mg/dL (ref 8.4–10.5)
Chloride: 106 mEq/L (ref 96–112)
Creatinine, Ser: 1.2 mg/dL (ref 0.4–1.5)
GFR: 78.6 mL/min (ref >60.00)
Glucose, Bld: 88 mg/dL (ref 70–99)
Potassium: 4.1 mEq/L (ref 3.5–5.1)
Sodium: 138 mEq/L (ref 135–145)

## 2014-03-18 MED ORDER — WARFARIN SODIUM 10 MG PO TABS: ORAL_TABLET | ORAL | Status: DC

## 2014-03-18 MED ORDER — POTASSIUM CHLORIDE CRYS ER 10 MEQ PO TBCR: 10.0000 meq | EXTENDED_RELEASE_TABLET | Freq: Every day | ORAL | Status: DC

## 2014-03-18 MED ORDER — FUROSEMIDE 20 MG PO TABS: 20.0000 mg | ORAL_TABLET | Freq: Every day | ORAL | Status: DC

## 2014-03-18 MED ORDER — CARVEDILOL 12.5 MG PO TABS
ORAL_TABLET | ORAL | Status: DC
Start: 2014-03-18 — End: 2015-03-20

## 2014-03-18 MED ORDER — LISINOPRIL 30 MG PO TABS: 30.0000 mg | ORAL_TABLET | Freq: Every day | ORAL | Status: DC

## 2014-03-18 NOTE — Progress Notes (Signed)
Tony Howard Date of Birth: July 22, 1961 Medical Record #[id number]  History of Present Illness: Tony Howard is seen back today for a follow up visit. Seen for Dr. Shirlee Latch. 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 Dyspnea not felt to be due to sarcoid since he did not have much parenchymal disease on his CT per Dr. Shelle Iron.   Last seen in July of 2014 - seemed to be doing ok.  Comes in today. Here alone. Doing ok. Needs medicines refilled. Breathing is ok. No chest pain. Not dizzy or lightheaded. Limited mobility from arthritis. Does not want to see ortho. Trying to get disability. Not dizzy or lightheaded. Taking all of his medicines at night - including his Lasix - does have nocturia.   Current Outpatient Prescriptions  Medication Sig Dispense Refill  . carvedilol (COREG) 12.5 MG tablet TAKE ONE-HALF TABLET BY MOUTH TWICE DAILY WITH  A  MEAL  30 tablet  1  . furosemide (LASIX) 20 MG tablet Take 1 tablet (20 mg total) by mouth daily.  30 tablet  3  . lisinopril (PRINIVIL,ZESTRIL) 30 MG tablet Take 1 tablet (30 mg total) by mouth daily.  30 tablet  1  . naproxen sodium (ANAPROX) 220 MG tablet Take 220 mg by mouth as needed.      . potassium chloride (K-DUR,KLOR-CON) 10 MEQ tablet Take 1 tablet (10 mEq total) by mouth daily.  30 tablet  3  . warfarin (COUMADIN) 10 MG tablet TAKE AS DIRECTED BY COUMADIN CLINIC  35 tablet  0   No current facility-administered medications for this visit.    Allergies  Allergen Reactions  . Naprosyn [Naproxen] Hives  . Iodine Hives and Rash    shellfish   PMH:  1. Sarcoidosis: Pulmonary and cutaneous. CT chest in 4/13 showed hilar and mediastinal lymphadenopathy but no significant parenchymal involvement. PFTs (4/13) showed mild restriction. Patient follows with Dr. Shelle Iron.  2. HTN  3. OA hip with severe pain.  4. H/o CVA  5. Contrast allergy  6. Cardiomyopathy: Admitted in 4/13 with CP. Myoview showed EF 47% with  anterior ischemia. Echo showed EF 65%, apex poorly visualized. LHC (4/13) showed no significant coronary disease but there was a small LV apical aneurysm. We planned an MRI to evaluate but patient was too claustrophobic and could not do the study.   Past Medical History  Diagnosis Date  . Chronic pain   . Hypertension     echo 4/13:  mild LVH, EF 65-70%, grade 1 diast dysfxn, mild LAE  . Osteoarthritis   . Sarcoidosis of lung     Myoview 3/13 with EF 47%; anterior ischemia;  LHC without CAD;  MRI recommended to r/o cardiac sarcoid - but patient claustrophobic and MRI not done  . Aneurysm of heart     LHC 03/13/12:  Normal cors, EF 60-65% with small but definite apical aneurysm.  He was started on coumadin as thrombus formation was a high probability  . History of stroke   . Cutaneous sarcoidosis   . CVA (cerebral infarction)     Head CT showing encephalomalacia c/w old infarct in the distribution of the right PCA in April 2013  . DJD (degenerative joint disease)     Past Surgical History  Procedure Laterality Date  . Knee surgery    . Cystoscopy w/ ureteral stent placement      History  Smoking status  . Never Smoker   Smokeless tobacco  .  Never Used    History  Alcohol Use  . Yes    Comment: 3 everys every 2 weeks    Family History  Problem Relation Age of Onset  . Lung cancer Mother   . Multiple sclerosis Sister     Review of Systems: The review of systems is per the HPI.  All other systems were reviewed and are negative.  Physical Exam: BP 180/90  Pulse 76  Ht 6\' 5"  (1.956 m)  Wt 298 lb 6.4 oz (135.353 kg)  BMI 35.38 kg/m2  SpO2 96% BP is down to 138/80 by me in the right arm.  Patient is very pleasant and in no acute distress. He is obese. Skin is warm and dry. Color is normal.  HEENT is unremarkable. Normocephalic/atraumatic. PERRL. Sclera are nonicteric. Neck is supple. No masses. No JVD. Lungs are clear. Cardiac exam shows a regular rate and rhythm. Abdomen  is soft. Extremities are without edema. Gait and ROM are intact but he is limping from hip pain. No gross neurologic deficits noted.  Wt Readings from Last 3 Encounters:  03/18/14 298 lb 6.4 oz (135.353 kg)  07/15/13 297 lb (134.718 kg)  07/05/13 306 lb 6.4 oz (138.982 kg)    LABORATORY DATA: LABS AND INR PENDING  Lab Results  Component Value Date   WBC 4.8 04/15/2012   HGB 13.8 04/15/2012   HCT 40.7 04/15/2012   PLT 129.0* 04/15/2012   GLUCOSE 95 07/23/2013   CHOL 213* 07/23/2013   TRIG 164.0* 07/23/2013   HDL 38.60* 07/23/2013   LDLDIRECT 143.9 07/23/2013   LDLCALC 122* 03/13/2012   ALT 40 03/10/2012   AST 27 03/10/2012   NA 136 07/23/2013   K 4.0 07/23/2013   CL 106 07/23/2013   CREATININE 1.5 07/23/2013   BUN 25* 07/23/2013   CO2 27 07/23/2013   TSH 1.929 03/10/2012   INR 2.0 03/03/2014   Myoview Impression from August 2014  Exercise Capacity: Lexiscan with low level exercise.  BP Response: Normal blood pressure response.  Clinical Symptoms: Mild chest pain/dyspnea.  ECG Impression: There are scattered PVCs.  Comparison with Prior Nuclear Study: Since last study from 2013 the reversible anterior wall ischemia is no longer seen.  Overall Impression: Low risk stress nuclear study. No evidence of ischemia. EF of 47% is unchanged from prior study.  LV Ejection Fraction: 47%. LV Wall Motion: Normal Wall Motion  Thomas Brackbill  EF 47% with no ischemia or infarction. Would make sure he gets echo to confirm EF.  [name]  07/16/2013  Echo Study Conclusions from July 2014  - Left ventricle: The cavity size was normal. Wall thickness was increased in a pattern of mild LVH. Systolic function was normal. The estimated ejection fraction was in the range of 55% to 60%. Wall motion was normal; there were no regional wall motion abnormalities. Doppler parameters are consistent with abnormal left ventricular relaxation (grade 1 diastolic dysfunction). - Mitral valve: Mild regurgitation. -  Pulmonary arteries: Systolic pressure was mildly increased. PA peak pressure: 33mm Hg (S).   Assessment / Plan: 1. HTN - BP by me is ok. I have refilled his medicines today.  2. Prior stroke   3. Apical aneurysm - on Coumadin.  4. Obesity  5. Sarcoid  6. Cardiomyopathy - EF normal by last echo - on ACE and beta blocker. Will continue. I have encouraged him to take his medicines in the morning - I think this will resolve his nocturia.  See him back in  6 months. Medicines refilled today. Check labs today. Encouraged him to be as active as possible. Minimize his use of NSAID.   Patient is agreeable to this plan and will call if any problems develop in the interim.   Rosalio MacadamiaLori C. Aracelli Woloszyn, RN, ANP-C [facility] [address] [place], [place]  [id number] [phone]

## 2014-03-18 NOTE — Patient Instructions (Addendum)
I think you are doing well  Stay on your current medicines - take in the morning - I have refilled your medicines today  Try to minimize your use of Alleve  Let your wife check some blood pressures for us  Be as active as you can  We will[place] check labs today  Call the [facility] office at [phone] if you have any questions, problems or concerns.

## 2014-04-07 ENCOUNTER — Other Ambulatory Visit: Payer: Self-pay | Admitting: Cardiology

## 2014-04-19 ENCOUNTER — Other Ambulatory Visit: Payer: Self-pay | Admitting: Cardiology

## 2014-05-04 ENCOUNTER — Ambulatory Visit: Payer: Self-pay | Admitting: Cardiology

## 2014-07-24 ENCOUNTER — Other Ambulatory Visit: Payer: Self-pay | Admitting: Cardiology

## 2014-09-19 ENCOUNTER — Ambulatory Visit: Payer: Self-pay | Admitting: Nurse Practitioner

## 2014-09-26 ENCOUNTER — Ambulatory Visit: Payer: Self-pay | Admitting: Nurse Practitioner

## 2014-09-27 ENCOUNTER — Ambulatory Visit (INDEPENDENT_AMBULATORY_CARE_PROVIDER_SITE_OTHER): Payer: Self-pay | Admitting: Cardiology

## 2014-09-27 ENCOUNTER — Encounter: Payer: Self-pay | Admitting: Cardiology

## 2014-09-27 ENCOUNTER — Encounter: Payer: Self-pay | Admitting: *Deleted

## 2014-09-27 VITALS — BP 126/74 | HR 76 | Ht 76.0 in | Wt 296.0 lb

## 2014-09-27 DIAGNOSIS — D869 Sarcoidosis, unspecified: Secondary | ICD-10-CM

## 2014-09-27 DIAGNOSIS — I253 Aneurysm of heart: Secondary | ICD-10-CM

## 2014-09-27 DIAGNOSIS — R0609 Other forms of dyspnea: Secondary | ICD-10-CM

## 2014-09-27 DIAGNOSIS — R0602 Shortness of breath: Secondary | ICD-10-CM

## 2014-09-27 MED ORDER — TRAMADOL HCL 50 MG PO TABS: 50.0000 mg | ORAL_TABLET | Freq: Two times a day (BID) | ORAL | Status: DC

## 2014-09-27 MED ORDER — ATORVASTATIN CALCIUM 20 MG PO TABS: 20.0000 mg | ORAL_TABLET | Freq: Every day | ORAL | Status: DC

## 2014-09-27 NOTE — Patient Instructions (Addendum)
Your physician recommends that you have lab work today--BMET/BNP/ACE   Your physician has recommended that you have a pulmonary function test. Pulmonary Function Tests are a group of tests that measure how well air moves in and out of your lungs.  Your physician has requested that you have an echocardiogram. Echocardiography is a painless test that uses sound waves to create images of your heart. It provides your doctor with information about the size and shape of your heart and how well your heart's chambers and valves are working. This procedure takes approximately one hour. There are no restrictions for this procedure.  Start atorvastatin 20mg  daily.  Your physician recommends that you return for a FASTING lipid profile /liver profile in 2 months.   Your physician recommends that you schedule a follow-up appointment in: 1 month with Dr Shirlee LatchMcLean.

## 2014-09-27 NOTE — Progress Notes (Signed)
Patient ID: Tony FerraraJustice M Swatek, male   DOB: 01/24/1961, 53 y.o.   MRN: [id number] PCP: Dr. 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  His main complaint in the past had been dyspnea.  He did not appear volume overloaded on exam and Dr. Shelle Ironlance did not think that his sarcoid was responsible for his exertional dyspnea (not much parenchymal disease on CT).  Last echo in 7/14 showed EF 55-60% with mild MR and PA systolic pressure 33 mmHg; normal RV size and systolic function; LV apical aneurysm was not visualized.   Since last appointment, dyspnea has gradually worsened.  He is short of breath after walking about 150 feet.  He is very short of breath with stairs.  No chest pain, no palpitations, no orthopnea/PND.  He has a lot of pain in his left hip.  This has been chronic.  No stroke-like symptoms, no melena or BRBPR on coumadin.   ECG: NSR, borderline LVH, nonspecific T wave flattening  Labs (4/15): K 4.1, creatinine 1.2, LDL 168  PMH:  1. Sarcoidosis: Pulmonary and cutaneous.  CT chest in 4/13 showed hilar and mediastinal lymphadenopathy but no significant parenchymal involvement.  PFTs (4/13) showed mild restriction.  Patient follows with Dr. Shelle Ironlance. Suspect cardiac involvement with non-CAD-related LV aneurysm.  2. HTN 3. OA hip with severe pain.  4. H/o CVA 5. Contrast allergy 6. Cardiomyopathy: Admitted in 4/13 with CP.  Myoview showed EF 47% with anterior ischemia. Echo showed EF 65%, apex poorly visualized.  LHC (4/13) showed no significant coronary disease but there was a small LV apical aneurysm. LV aneurysms have been seen with cardiac sarcoidosis, and I suspect this is the cause.  We planned an MRI to evaluate but patient was too claustrophobic and could not do the study.  Lexiscan Cardiolite (7/14) with EF 47%, no ischemia or infarction.  Echo (7/14) with EF 55-60%, mild MR, PA systolic pressure 33 mmHg, normal RV  size and systolic function, LV apical aneurysm was not visualized on this echo.  FH: Sister with multiple sclerosis.   SH: Married, nonsmoker, unemployed (used to work in [employer]).   ROS: All systems reviewed and negative except as per HPI.   Current Outpatient Prescriptions  Medication Sig Dispense Refill  . carvedilol (COREG) 12.5 MG tablet TAKE ONE-HALF TABLET BY MOUTH TWICE DAILY WITH  A  MEAL  60 tablet  11  . furosemide (LASIX) 20 MG tablet Take 1 tablet (20 mg total) by mouth daily.  30 tablet  11  . hydrochlorothiazide (HYDRODIURIL) 25 MG tablet TAKE ONE-HALF TABLET BY MOUTH ONCE DAILY  30 tablet  3  . lisinopril (PRINIVIL,ZESTRIL) 30 MG tablet Take 1 tablet (30 mg total) by mouth daily.  30 tablet  11  . naproxen sodium (ANAPROX) 220 MG tablet Take 220 mg by mouth as needed.      . potassium chloride (K-DUR,KLOR-CON) 10 MEQ tablet Take 1 tablet (10 mEq total) by mouth daily.  30 tablet  11  . warfarin (COUMADIN) 10 MG tablet TAKE AS DIRECTED BY COUMADIN CLINIC  35 tablet  11  . atorvastatin (LIPITOR) 20 MG tablet Take 1 tablet (20 mg total) by mouth daily.  30 tablet  3  . traMADol (ULTRAM) 50 MG tablet Take 1 tablet (50 mg total) by mouth 2 (two) times daily.  28 tablet  0   No current facility-administered medications for this visit.    BP  126/74  Pulse 76  Ht 6\' 4"  (1.93 m)  Wt 296 lb (134.265 kg)  BMI 36.05 kg/m2 General: NAD, obese.  Neck: JVP 7 cm, no thyromegaly or thyroid nodule.  Lungs: Clear to auscultation bilaterally with normal respiratory effort. CV: Nondisplaced PMI.  Heart regular S1/S2, no S3/S4, no murmur.  Trace ankle edema.  No carotid bruit.  Normal pedal pulses.  Abdomen: Soft, nontender, no hepatosplenomegaly, no distention.  Neurologic: Alert/oriented x 3 Psych: Normal affect. Extremities: No clubbing or cyanosis.   Assessment/Plan: 1. Cardiomyopathy: Patient has been noted in the past to have a small LV apical aneurysm.  He was unable to do  a cardiac MRI due to claustrophobia.  He had no coronary disease on angiography.  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  LV aneurysm formation has been described with cardiac sarcoidosis.  He had a CVA in the past so is on chronic coumadin.  Given the possibility of cardiac sarcoidosis, I wanted to get a 48 hour holter to screen for arrhythmias, but as he would be self-pay for this, we will hold off until he has insurance.  2. Exertional dyspnea: Patient has had increased exertional dyspnea over the last few months.  He is not volume overloaded on exam.   - Continue current Lasix.  - BMET and BNP today. - I will get PFTs to assess for evidence for worsening lung parenchymal disease from sarcoidosis.  - Echo to reassess LV systolic function.  - ACE level.  3. Hyperlipidemia: LDL is quite high.  I asked him to start atorvastatin 20 mg daily with lipids/LFTs in 2 months. 4. Left hip pain: I will give him a limited tramadol supply for the pain until he can get back to his PCP.   Marca AnconaDalton Chrisandra Wiemers 09/27/2014

## 2014-09-28 LAB — BASIC METABOLIC PANEL
BUN: 21 mg/dL (ref 6–23)
CALCIUM: 9.7 mg/dL (ref 8.4–10.5)
CO2: 19 mEq/L (ref 19–32)
Chloride: 108 mEq/L (ref 96–112)
Creatinine, Ser: 1.5 mg/dL (ref 0.4–1.5)
GFR: 64.45 mL/min (ref >60.00)
Glucose, Bld: 89 mg/dL (ref 70–99)
POTASSIUM: 4.7 meq/L (ref 3.5–5.1)
SODIUM: 139 meq/L (ref 135–145)

## 2014-09-28 LAB — ANGIOTENSIN CONVERTING ENZYME: Angiotensin-Converting Enzyme: 4 U/L — ABNORMAL LOW (ref 8–52)

## 2014-09-28 LAB — BRAIN NATRIURETIC PEPTIDE: Pro B Natriuretic peptide (BNP): 12 pg/mL (ref 0.0–100.0)

## 2014-10-04 ENCOUNTER — Other Ambulatory Visit (HOSPITAL_COMMUNITY): Payer: Self-pay

## 2014-10-04 ENCOUNTER — Ambulatory Visit (HOSPITAL_COMMUNITY): Payer: Self-pay | Attending: Cardiology

## 2014-10-04 DIAGNOSIS — R0602 Shortness of breath: Secondary | ICD-10-CM

## 2014-10-04 DIAGNOSIS — I10 Essential (primary) hypertension: Secondary | ICD-10-CM | POA: Insufficient documentation

## 2014-10-04 DIAGNOSIS — D869 Sarcoidosis, unspecified: Secondary | ICD-10-CM

## 2014-10-04 DIAGNOSIS — R609 Edema, unspecified: Secondary | ICD-10-CM | POA: Insufficient documentation

## 2014-10-04 DIAGNOSIS — R001 Bradycardia, unspecified: Secondary | ICD-10-CM | POA: Insufficient documentation

## 2014-10-04 NOTE — Progress Notes (Signed)
2D Echo completed. 10/04/2014 

## 2014-10-10 ENCOUNTER — Telehealth: Payer: Self-pay | Admitting: Cardiology

## 2014-10-10 NOTE — Telephone Encounter (Signed)
New Message      Pt is calling wanting to get the results from his Echocardiogram from 10/04/14. Please call pt back and advise.

## 2014-10-10 NOTE — Telephone Encounter (Signed)
Spoke with patient about echo results from last week.

## 2014-10-11 ENCOUNTER — Other Ambulatory Visit: Payer: Self-pay

## 2014-10-18 ENCOUNTER — Ambulatory Visit (INDEPENDENT_AMBULATORY_CARE_PROVIDER_SITE_OTHER): Payer: Self-pay | Admitting: Internal Medicine

## 2014-10-18 DIAGNOSIS — D869 Sarcoidosis, unspecified: Secondary | ICD-10-CM

## 2014-10-18 DIAGNOSIS — R0602 Shortness of breath: Secondary | ICD-10-CM

## 2014-10-18 LAB — PULMONARY FUNCTION TEST
DL/VA % PRED: 83 %
DL/VA: 4.05 ml/min/mmHg/L
DLCO UNC % PRED: 53 %
DLCO unc: 20.64 ml/min/mmHg
FEF 25-75 PRE: 2.26 L/s
FEF 25-75 Post: 2.83 L/sec
FEF2575-%CHANGE-POST: 25 %
FEF2575-%PRED-POST: 77 %
FEF2575-%Pred-Pre: 61 %
FEV1-%Change-Post: 5 %
FEV1-%PRED-POST: 63 %
FEV1-%Pred-Pre: 59 %
FEV1-POST: 2.42 L
FEV1-PRE: 2.3 L
FEV1FVC-%Change-Post: 3 %
FEV1FVC-%PRED-PRE: 101 %
FEV6-%CHANGE-POST: 1 %
FEV6-%Pred-Post: 61 %
FEV6-%Pred-Pre: 60 %
FEV6-POST: 2.88 L
FEV6-Pre: 2.84 L
FEV6FVC-%Pred-Post: 103 %
FEV6FVC-%Pred-Pre: 103 %
FVC-%CHANGE-POST: 1 %
FVC-%PRED-POST: 59 %
FVC-%Pred-Pre: 58 %
FVC-Post: 2.88 L
FVC-Pre: 2.84 L
PRE FEV1/FVC RATIO: 81 %
PRE FEV6/FVC RATIO: 100 %
Post FEV1/FVC ratio: 84 %
Post FEV6/FVC ratio: 100 %
RV % pred: 56 %
RV: 1.32 L
TLC % pred: 57 %
TLC: 4.55 L

## 2014-10-18 NOTE — Progress Notes (Signed)
PFT done today. 

## 2014-10-28 ENCOUNTER — Ambulatory Visit (INDEPENDENT_AMBULATORY_CARE_PROVIDER_SITE_OTHER): Payer: Self-pay | Admitting: Cardiology

## 2014-10-28 VITALS — BP 114/80 | HR 77 | Ht 76.0 in | Wt 296.0 lb

## 2014-10-28 DIAGNOSIS — D869 Sarcoidosis, unspecified: Secondary | ICD-10-CM

## 2014-10-28 DIAGNOSIS — I253 Aneurysm of heart: Secondary | ICD-10-CM

## 2014-10-28 DIAGNOSIS — R0609 Other forms of dyspnea: Secondary | ICD-10-CM

## 2014-10-28 NOTE — Patient Instructions (Signed)
You have been referred to Dr Clance ASAP-eShelle Ironvaluation and management of sarcoidosis.  Your physician wants you to follow-up in: 6 months with Dr Shirlee LatchMcLean. (May 2016).  You will receive a reminder letter in the mail two months in advance. If you don't receive a letter, please call our office to schedule the follow-up appointment.

## 2014-10-30 ENCOUNTER — Encounter: Payer: Self-pay | Admitting: Cardiology

## 2014-10-30 NOTE — Progress Notes (Signed)
Patient ID: Tony Howard, male   DOB: 12/28/1960, 53 y.o.   MRN: [id number] PCP: Dr. 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  His main complaint in the past had been dyspnea.  He did not appear volume overloaded on exam and Tony Howard did not think that his sarcoid was responsible for his exertional dyspnea (not much parenchymal disease on CT).  Most recent echo in 10/15 showed EF 55-60% with mild LVH.   At last appointment, patient reported increased dyspnea.  He was short of breath after walking about 100 feet.  He was very short of breath with stairs.  No chest pain, no palpitations, no orthopnea/PND.  He has a lot of pain in his left hip.  This has been chronic.  No stroke-like symptoms, no melena or BRBPR on coumadin.  I had him do PFTs, which showed moderate to severe obstruction and severe restriction.  I checked his oxygen level ambulating in the office today, it was 94% on room air.   Labs (4/15): K 4.1, creatinine 1.2, LDL 168 Labs (10/15): ACE level 4 (low), K 4.7, creatinine 1.5, pro-BNP 12  PMH:  1. Sarcoidosis: Pulmonary and cutaneous.  CT chest in 4/13 showed hilar and mediastinal lymphadenopathy but no significant parenchymal involvement.  PFTs (4/13) showed mild restriction.  Patient follows with Tony Howard. Suspect cardiac involvement with non-CAD-related LV aneurysm.  PFTs (11/15): FVC 58%, FEV1 59%, ration 101%, TLC 57%, DLCO 53% (moderate-severe obstruction, severe parenchymal restriction).  2. HTN 3. OA hip with severe pain.  4. H/o CVA 5. Contrast allergy 6. Cardiomyopathy: Admitted in 4/13 with CP.  Myoview showed EF 47% with anterior ischemia. Echo showed EF 65%, apex poorly visualized.  LHC (4/13) showed no significant coronary disease but there was a small LV apical aneurysm. LV aneurysms have been seen with cardiac sarcoidosis, and I suspect this is the cause.  We planned an MRI to evaluate  but patient was too claustrophobic and could not do the study.  Lexiscan Cardiolite (7/14) with EF 47%, no ischemia or infarction.  Echo (7/14) with EF 55-60%, mild MR, PA systolic pressure 33 mmHg, normal RV size and systolic function, LV apical aneurysm was not visualized on this echo.  Echo (10/15) with EF 55-60%, mild LVH, LV apical aneurysm was not visualized.   FH: Sister with multiple sclerosis.   SH: Married, nonsmoker, unemployed (used to work in [employer]).   ROS: All systems reviewed and negative except as per HPI.   Current Outpatient Prescriptions  Medication Sig Dispense Refill  . atorvastatin (LIPITOR) 20 MG tablet Take 1 tablet (20 mg total) by mouth daily. 30 tablet 3  . carvedilol (COREG) 12.5 MG tablet TAKE ONE-HALF TABLET BY MOUTH TWICE DAILY WITH  A  MEAL 60 tablet 11  . furosemide (LASIX) 20 MG tablet Take 1 tablet (20 mg total) by mouth daily. 30 tablet 11  . hydrochlorothiazide (HYDRODIURIL) 25 MG tablet TAKE ONE-HALF TABLET BY MOUTH ONCE DAILY 30 tablet 3  . lisinopril (PRINIVIL,ZESTRIL) 30 MG tablet Take 1 tablet (30 mg total) by mouth daily. 30 tablet 11  . naproxen sodium (ANAPROX) 220 MG tablet Take 220 mg by mouth as needed.    . potassium chloride (K-DUR,KLOR-CON) 10 MEQ tablet Take 1 tablet (10 mEq total) by mouth daily. 30 tablet 11  . traMADol (ULTRAM) 50 MG tablet Take 1 tablet (50 mg total) by mouth 2 (two) times  daily. 28 tablet 0  . warfarin (COUMADIN) 10 MG tablet TAKE AS DIRECTED BY COUMADIN CLINIC 35 tablet 11   No current facility-administered medications for this visit.    BP 114/80 mmHg  Pulse 77  Ht 6\' 4"  (1.93 m)  Wt 296 lb (134.265 kg)  BMI 36.05 kg/m2 General: NAD, obese.  Neck: JVP 7 cm, no thyromegaly or thyroid nodule.  Lungs: Clear to auscultation bilaterally with normal respiratory effort. CV: Nondisplaced PMI.  Heart regular S1/S2, no S3/S4, no murmur.  Trace ankle edema.  No carotid bruit.  Normal pedal pulses.  Abdomen: Soft,  nontender, no hepatosplenomegaly, no distention.  Neurologic: Alert/oriented x 3 Psych: Normal affect. Extremities: No clubbing or cyanosis.   Assessment/Plan: 1. Cardiomyopathy: Patient has been noted in the past to have a small LV apical aneurysm.  He was unable to do a cardiac MRI due to claustrophobia.  He had no coronary disease on angiography.  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  LV aneurysm formation has been described with cardiac sarcoidosis.  He had a CVA in the past so is on chronic coumadin.  Most recent echo in 10/15 did not visualize the LV apical aneurysm. Given the possibility of cardiac sarcoidosis, I have wanted to get a 48 hour holter to screen for arrhythmias, but as he would be self-pay for this, we will hold off until he has insurance.  2. Exertional dyspnea: Patient has had increased exertional dyspnea over the last few months => NYHA class III.  He is not volume overloaded on exam and pro-BNP was normal in 10/15.  His PFTs are markedly abnormal.  I think that sarcoidosis may be the main culprit here.   - Continue current Lasix.  - I will refer back to pulmonary for re-evaluation of sarcoidosis given worsening dyspnea.  3. Hyperlipidemia: He is now on atorvastatin.  Will check lipids/LFTs in 12/15.   Marca Tony Howard 10/30/2014

## 2014-11-02 ENCOUNTER — Encounter: Payer: Self-pay | Admitting: Pulmonary Disease

## 2014-11-02 ENCOUNTER — Ambulatory Visit (INDEPENDENT_AMBULATORY_CARE_PROVIDER_SITE_OTHER): Payer: Self-pay | Admitting: Pulmonary Disease

## 2014-11-02 VITALS — BP 112/76 | HR 62 | Temp 98.0°F | Ht 76.0 in | Wt 297.0 lb

## 2014-11-02 DIAGNOSIS — R0609 Other forms of dyspnea: Secondary | ICD-10-CM

## 2014-11-02 DIAGNOSIS — D869 Sarcoidosis, unspecified: Secondary | ICD-10-CM

## 2014-11-02 NOTE — Assessment & Plan Note (Signed)
The patient has chronic dyspnea on exertion, and feels that it may be a little worse because of his sedentary lifestyle. Because his last chest CT showed no parenchymal lung involvement, this was not felt secondary to his pulmonary sarcoid. His most recent PFTs showed a decrease in his FEV1, but his ratio is completely normal. There is no significant air trapping on his lung volumes, but his flow volume loop does suggest subtle airflow obstruction. He does have significant restriction and decrease in his DLCO, but again, this is difficult to blame on his sarcoid unless he has significant parenchymal involvement on his chest CT. This is going to be repeated in the near future. More than likely, his dyspnea on exertion is related to his obesity and deconditioning, but I will give him a trial of an inhaled bronchodilator to see if he sees a difference given the changes on his flow volume loop. If he does not, I would certainly discontinue this, and keep working on weight loss and some type of exercise program.

## 2014-11-02 NOTE — Assessment & Plan Note (Signed)
The patient has a history of pulmonary sarcoidosis long-standing that has manifested only as lymphadenopathy and a few nodules, but no interstitial lung disease.  He has had restriction on his pulmonary function studies in the past, as well as a significant reduction in his diffusion capacity that corrected with alveolar volume adjustment. Because he has no significant parenchymal lung disease, this was felt secondary to his obesity and not his sarcoidosis. He has not had radiographic follow-up in 2 years, and because he is having increased dyspnea on exertion, I think he needs a follow-up CT chest.

## 2014-11-02 NOTE — Progress Notes (Signed)
   Subjective:    Patient ID: Tony Howard, male    DOB: 09/08/1961, 53 y.o.   MRN: [id number]  HPI The patient comes in today for follow-up of his known sarcoidosis. I have not seen him since 2013, and he comes in today with persistent dyspnea on exertion that he feels is worse from his last visit. He admits that he has been much more sedentary, and feels this is likely due to deconditioning. His weight is very similar from his last visit, although the patient feels that he has lost significant weight.  He has had a follow-up pulmonary function studies that show a significant decrease in his FEV1, but his ratio is completely normal. He has no air trapping, but his flow volume loop does suggest some element of airflow obstruction. He has severe restriction by total lung capacity, and his diffusion capacity is moderately reduced. He denies any significant cough or mucus production. He does state that various musculoskeletal issues interfere with his walking more than his breathing.  He has not had recent radiographic evaluation of his sarcoid.   Review of Systems  Constitutional: Negative for fever and unexpected weight change.  HENT: Negative for congestion, dental problem, ear pain, nosebleeds, postnasal drip, rhinorrhea, sinus pressure, sneezing, sore throat and trouble swallowing.   Eyes: Negative for redness and itching.  Respiratory: Negative for cough, chest tightness, shortness of breath and wheezing.   Cardiovascular: Positive for leg swelling. Negative for palpitations.  Gastrointestinal: Negative for nausea and vomiting.  Genitourinary: Negative for dysuria.  Musculoskeletal: Negative for joint swelling.  Skin: Negative for rash.  Neurological: Negative for headaches.  Hematological: Does not bruise/bleed easily.  Psychiatric/Behavioral: Negative for dysphoric mood. The patient is not nervous/anxious.        Objective:   Physical Exam Obese male in no acute distress Nose  without purulence or discharge noted Neck without lymphadenopathy or thyromegaly Chest with clear breath sounds, no wheezes or crackles Cardiac exam with regular rate and rhythm Lower extremities with 1+ edema, no cyanosis Alert and oriented, moves all 4 extremities.       Assessment & Plan:

## 2014-11-02 NOTE — Patient Instructions (Signed)
Will try on breo 100, one inhalation each am everyday for one month.  Rinse your mouth out well after using. Will schedule for scan of your chest to re-evaluate your sarcoidosis.  Will call you with results. Please call me in 4 weeks to give update on the new inhaler.  We can then arrange followup with me.

## 2014-11-09 ENCOUNTER — Telehealth: Payer: Self-pay | Admitting: Cardiology

## 2014-11-09 ENCOUNTER — Ambulatory Visit (INDEPENDENT_AMBULATORY_CARE_PROVIDER_SITE_OTHER)
Admission: RE | Admit: 2014-11-09 | Discharge: 2014-11-09 | Disposition: A | Discharge status: Home / Self Care | Payer: Self-pay | Source: Ambulatory Visit | Attending: Pulmonary Disease | Admitting: Pulmonary Disease

## 2014-11-09 DIAGNOSIS — D869 Sarcoidosis, unspecified: Secondary | ICD-10-CM

## 2014-11-09 NOTE — Telephone Encounter (Signed)
Pt advised Dr Shirlee LatchMcLean will not be able to provide tramadol refills. Pt verbalized understanding.

## 2014-11-09 NOTE — Telephone Encounter (Signed)
New msg    Patient is calling requesting refill of tramadol. Please contact at [phone].

## 2014-11-10 ENCOUNTER — Other Ambulatory Visit (INDEPENDENT_AMBULATORY_CARE_PROVIDER_SITE_OTHER): Payer: Self-pay | Admitting: *Deleted

## 2014-11-10 DIAGNOSIS — R0602 Shortness of breath: Secondary | ICD-10-CM

## 2014-11-10 DIAGNOSIS — D869 Sarcoidosis, unspecified: Secondary | ICD-10-CM

## 2014-11-10 LAB — HEPATIC FUNCTION PANEL
ALBUMIN: 3.1 g/dL — AB (ref 3.5–5.2)
ALK PHOS: 84 U/L (ref 39–117)
ALT: 24 U/L (ref 0–53)
AST: 20 U/L (ref 0–37)
Bilirubin, Direct: 0 mg/dL (ref 0.0–0.3)
TOTAL PROTEIN: 6.9 g/dL (ref 6.0–8.3)
Total Bilirubin: 0.7 mg/dL (ref 0.2–1.2)

## 2014-11-10 LAB — LIPID PANEL
CHOL/HDL RATIO: 6
Cholesterol: 195 mg/dL (ref 0–200)
HDL: 32.2 mg/dL — AB (ref >39.00)
LDL Cholesterol: 135 mg/dL — ABNORMAL HIGH (ref 0–99)
NonHDL: 162.8
Triglycerides: 141 mg/dL (ref 0.0–149.0)
VLDL: 28.2 mg/dL (ref 0.0–40.0)

## 2014-11-15 ENCOUNTER — Telehealth: Payer: Self-pay | Admitting: Cardiology

## 2014-11-15 NOTE — Telephone Encounter (Signed)
Spoke with patient about recent lab results 

## 2014-11-15 NOTE — Telephone Encounter (Signed)
New message ° ° ° ° ° ° °Pt returning nurse call  °

## 2014-11-17 ENCOUNTER — Encounter (HOSPITAL_COMMUNITY): Payer: Self-pay | Admitting: Cardiovascular Disease

## 2014-12-15 ENCOUNTER — Telehealth: Payer: Self-pay | Admitting: Cardiology

## 2014-12-15 NOTE — Telephone Encounter (Signed)
No answer

## 2014-12-15 NOTE — Telephone Encounter (Signed)
New message    Patient calling need to discuss his aneurysm  & medication

## 2014-12-16 NOTE — Telephone Encounter (Signed)
Pt has applied for financial assistance. He is requesting a letter from Dr Shirlee LatchMcLean that states diagnosis and limitations, why he cannot work.  Pt advised I will review with Dr Shirlee LatchMcLean.

## 2014-12-16 NOTE — Telephone Encounter (Signed)
Per Dr Almon HerculesMcLean-pt does have some evidence of cardiac sarcoidosis but he feels pt's SOB more likely related to pulmonary sarcoidosis. Pt should contact pulmonary doctor for limitations and ability to work recommendations.  Pt advised.

## 2014-12-28 ENCOUNTER — Ambulatory Visit (INDEPENDENT_AMBULATORY_CARE_PROVIDER_SITE_OTHER): Payer: Self-pay | Admitting: *Deleted

## 2014-12-28 DIAGNOSIS — Z7901 Long term (current) use of anticoagulants: Secondary | ICD-10-CM

## 2014-12-28 DIAGNOSIS — I253 Aneurysm of heart: Secondary | ICD-10-CM

## 2014-12-28 DIAGNOSIS — Z7902 Long term (current) use of antithrombotics/antiplatelets: Secondary | ICD-10-CM

## 2014-12-28 DIAGNOSIS — D869 Sarcoidosis, unspecified: Secondary | ICD-10-CM

## 2014-12-28 LAB — POCT INR: INR: 2.7

## 2014-12-28 MED ORDER — WARFARIN SODIUM 10 MG PO TABS: ORAL_TABLET | ORAL | Status: DC

## 2015-01-10 ENCOUNTER — Telehealth: Payer: Self-pay | Admitting: Cardiology

## 2015-01-10 NOTE — Telephone Encounter (Signed)
Pt is asking why his last refill of coumadin was for only #20. Pt states his son helps him with his medication and the cost is less if he gets more tablets at one time.  Pt advised I will forward to [facility] to followup with him.

## 2015-01-10 NOTE — Telephone Encounter (Signed)
New Msg        Pt requesting to be called back. No details given.

## 2015-01-11 NOTE — Telephone Encounter (Signed)
Telephone pt and discussed refill, pt hadn't been in prior to last visit since 03/2014. Explain compliance again and reason behind rx without refills. Pt aware to come in tomorrow for appt and that we will do another rx at that time.

## 2015-01-12 ENCOUNTER — Ambulatory Visit (INDEPENDENT_AMBULATORY_CARE_PROVIDER_SITE_OTHER): Payer: Self-pay | Admitting: Pharmacist

## 2015-01-12 DIAGNOSIS — Z7901 Long term (current) use of anticoagulants: Secondary | ICD-10-CM

## 2015-01-12 DIAGNOSIS — Z7902 Long term (current) use of antithrombotics/antiplatelets: Secondary | ICD-10-CM

## 2015-01-12 DIAGNOSIS — I253 Aneurysm of heart: Secondary | ICD-10-CM

## 2015-01-12 DIAGNOSIS — D869 Sarcoidosis, unspecified: Secondary | ICD-10-CM

## 2015-01-12 LAB — POCT INR: INR: 3.6

## 2015-01-12 MED ORDER — WARFARIN SODIUM 10 MG PO TABS: ORAL_TABLET | ORAL | Status: DC

## 2015-02-02 ENCOUNTER — Ambulatory Visit (INDEPENDENT_AMBULATORY_CARE_PROVIDER_SITE_OTHER): Payer: Self-pay | Admitting: *Deleted

## 2015-02-02 DIAGNOSIS — D869 Sarcoidosis, unspecified: Secondary | ICD-10-CM

## 2015-02-02 DIAGNOSIS — Z7901 Long term (current) use of anticoagulants: Secondary | ICD-10-CM

## 2015-02-02 DIAGNOSIS — Z7902 Long term (current) use of antithrombotics/antiplatelets: Secondary | ICD-10-CM

## 2015-02-02 DIAGNOSIS — I253 Aneurysm of heart: Secondary | ICD-10-CM

## 2015-02-02 LAB — POCT INR: INR: 2.6

## 2015-02-02 MED ORDER — WARFARIN SODIUM 10 MG PO TABS: ORAL_TABLET | ORAL | Status: DC

## 2015-03-02 ENCOUNTER — Ambulatory Visit (INDEPENDENT_AMBULATORY_CARE_PROVIDER_SITE_OTHER): Payer: Self-pay | Admitting: *Deleted

## 2015-03-02 DIAGNOSIS — Z7902 Long term (current) use of antithrombotics/antiplatelets: Secondary | ICD-10-CM

## 2015-03-02 DIAGNOSIS — Z7901 Long term (current) use of anticoagulants: Secondary | ICD-10-CM

## 2015-03-02 DIAGNOSIS — D869 Sarcoidosis, unspecified: Secondary | ICD-10-CM

## 2015-03-02 DIAGNOSIS — I253 Aneurysm of heart: Secondary | ICD-10-CM

## 2015-03-02 LAB — POCT INR: INR: 3.3

## 2015-03-02 MED ORDER — WARFARIN SODIUM 10 MG PO TABS: ORAL_TABLET | ORAL | Status: DC

## 2015-03-20 ENCOUNTER — Other Ambulatory Visit: Payer: Self-pay | Admitting: Nurse Practitioner

## 2015-03-20 MED ORDER — LISINOPRIL 30 MG PO TABS: 30.0000 mg | ORAL_TABLET | Freq: Every day | ORAL | Status: DC

## 2015-03-20 MED ORDER — HYDROCHLOROTHIAZIDE 25 MG PO TABS: 12.5000 mg | ORAL_TABLET | Freq: Every day | ORAL | Status: DC

## 2015-04-25 ENCOUNTER — Ambulatory Visit (INDEPENDENT_AMBULATORY_CARE_PROVIDER_SITE_OTHER): Payer: Self-pay | Admitting: *Deleted

## 2015-04-25 DIAGNOSIS — Z7902 Long term (current) use of antithrombotics/antiplatelets: Secondary | ICD-10-CM

## 2015-04-25 DIAGNOSIS — I253 Aneurysm of heart: Secondary | ICD-10-CM

## 2015-04-25 DIAGNOSIS — Z7901 Long term (current) use of anticoagulants: Secondary | ICD-10-CM

## 2015-04-25 DIAGNOSIS — D869 Sarcoidosis, unspecified: Secondary | ICD-10-CM

## 2015-04-25 LAB — POCT INR: INR: 1.3

## 2015-04-25 MED ORDER — WARFARIN SODIUM 10 MG PO TABS: ORAL_TABLET | ORAL | Status: DC

## 2015-05-05 ENCOUNTER — Ambulatory Visit: Payer: Self-pay | Admitting: Cardiology

## 2015-05-19 ENCOUNTER — Ambulatory Visit (INDEPENDENT_AMBULATORY_CARE_PROVIDER_SITE_OTHER): Payer: Self-pay | Admitting: *Deleted

## 2015-05-19 DIAGNOSIS — Z7901 Long term (current) use of anticoagulants: Secondary | ICD-10-CM

## 2015-05-19 DIAGNOSIS — I253 Aneurysm of heart: Secondary | ICD-10-CM

## 2015-05-19 DIAGNOSIS — Z7902 Long term (current) use of antithrombotics/antiplatelets: Secondary | ICD-10-CM

## 2015-05-19 DIAGNOSIS — D869 Sarcoidosis, unspecified: Secondary | ICD-10-CM

## 2015-05-19 LAB — POCT INR: INR: 2.5

## 2015-05-29 ENCOUNTER — Other Ambulatory Visit: Payer: Self-pay | Admitting: Cardiology

## 2015-07-28 ENCOUNTER — Ambulatory Visit: Payer: Self-pay | Admitting: Cardiology

## 2015-08-25 ENCOUNTER — Other Ambulatory Visit: Payer: Self-pay | Admitting: Nurse Practitioner

## 2015-09-29 ENCOUNTER — Emergency Department (HOSPITAL_COMMUNITY)
Admission: EM | Admit: 2015-09-29 | Discharge: 2015-09-29 | Disposition: A | Discharge status: Home / Self Care | Payer: Medicaid Other | Attending: Emergency Medicine | Admitting: Emergency Medicine

## 2015-09-29 ENCOUNTER — Emergency Department (HOSPITAL_COMMUNITY): Payer: Medicaid Other

## 2015-09-29 ENCOUNTER — Encounter (HOSPITAL_COMMUNITY): Payer: Self-pay | Admitting: *Deleted

## 2015-09-29 DIAGNOSIS — Z79899 Other long term (current) drug therapy: Secondary | ICD-10-CM | POA: Insufficient documentation

## 2015-09-29 DIAGNOSIS — Z8673 Personal history of transient ischemic attack (TIA), and cerebral infarction without residual deficits: Secondary | ICD-10-CM | POA: Insufficient documentation

## 2015-09-29 DIAGNOSIS — Z791 Long term (current) use of non-steroidal anti-inflammatories (NSAID): Secondary | ICD-10-CM | POA: Insufficient documentation

## 2015-09-29 DIAGNOSIS — I10 Essential (primary) hypertension: Secondary | ICD-10-CM | POA: Insufficient documentation

## 2015-09-29 DIAGNOSIS — R0602 Shortness of breath: Secondary | ICD-10-CM | POA: Diagnosis not present

## 2015-09-29 DIAGNOSIS — G8929 Other chronic pain: Secondary | ICD-10-CM | POA: Insufficient documentation

## 2015-09-29 DIAGNOSIS — Z7901 Long term (current) use of anticoagulants: Secondary | ICD-10-CM | POA: Diagnosis not present

## 2015-09-29 DIAGNOSIS — M1990 Unspecified osteoarthritis, unspecified site: Secondary | ICD-10-CM | POA: Diagnosis not present

## 2015-09-29 DIAGNOSIS — Z862 Personal history of diseases of the blood and blood-forming organs and certain disorders involving the immune mechanism: Secondary | ICD-10-CM | POA: Insufficient documentation

## 2015-09-29 DIAGNOSIS — R079 Chest pain, unspecified: Secondary | ICD-10-CM | POA: Diagnosis not present

## 2015-09-29 LAB — I-STAT TROPONIN, ED
TROPONIN I, POC: 0.01 ng/mL (ref 0.00–0.08)
Troponin i, poc: 0.01 ng/mL (ref 0.00–0.08)

## 2015-09-29 LAB — CBC
HCT: 42.9 % (ref 39.0–52.0)
Hemoglobin: 14.4 g/dL (ref 13.0–17.0)
MCH: 29.9 pg (ref 26.0–34.0)
MCHC: 33.6 g/dL (ref 30.0–36.0)
MCV: 89.2 fL (ref 78.0–100.0)
PLATELETS: 153 10*3/uL (ref 150–400)
RBC: 4.81 MIL/uL (ref 4.22–5.81)
RDW: 12.8 % (ref 11.5–15.5)
WBC: 4.6 10*3/uL (ref 4.0–10.5)

## 2015-09-29 LAB — BASIC METABOLIC PANEL
ANION GAP: 6 (ref 5–15)
BUN: 22 mg/dL — ABNORMAL HIGH (ref 6–20)
CHLORIDE: 110 mmol/L (ref 101–111)
CO2: 22 mmol/L (ref 22–32)
Calcium: 9.1 mg/dL (ref 8.9–10.3)
Creatinine, Ser: 1.04 mg/dL (ref 0.61–1.24)
GFR calc Af Amer: >60 mL/min (ref >60)
Glucose, Bld: 110 mg/dL — ABNORMAL HIGH (ref 65–99)
POTASSIUM: 4.3 mmol/L (ref 3.5–5.1)
SODIUM: 138 mmol/L (ref 135–145)

## 2015-09-29 LAB — D-DIMER, QUANTITATIVE: D-Dimer, Quant: <0.27 ug/mL-FEU (ref 0.00–0.48)

## 2015-09-29 LAB — PROTIME-INR
INR: 3.06 — ABNORMAL HIGH (ref 0.00–1.49)
Prothrombin Time: 31 seconds — ABNORMAL HIGH (ref 11.6–15.2)

## 2015-09-29 MED ORDER — MORPHINE SULFATE (PF) 4 MG/ML IV SOLN
4.0000 mg | Freq: Once | INTRAVENOUS | Status: DC
  Filled 2015-09-29: qty 1

## 2015-09-29 MED ORDER — MORPHINE SULFATE (PF) 4 MG/ML IV SOLN
4.0000 mg | Freq: Once | INTRAVENOUS | Status: AC
  Administered 2015-09-29: 4 mg via INTRAVENOUS

## 2015-09-29 MED ORDER — HYDROCHLOROTHIAZIDE 25 MG PO TABS: 25.0000 mg | ORAL_TABLET | Freq: Every day | ORAL | Status: DC

## 2015-09-29 MED ORDER — LISINOPRIL 30 MG PO TABS: 30.0000 mg | ORAL_TABLET | Freq: Every day | ORAL | Status: DC

## 2015-09-29 NOTE — ED Notes (Signed)
Pt states that he began to have chest pain approx 1.5 hrs ago that woke him from sleep; pt states that he was sweaty and short of breath when he woke up; pt states that he is having left sided chest pain that radiates to left arm; pt also states that he is having left sided body pain; pt states that he has chronic hip problems and that he always has hip pain; pt states that the pain has persisted to his chest and left arm

## 2015-09-29 NOTE — Discharge Instructions (Signed)
Nonspecific Chest Pain  °Chest pain can be caused by many different conditions. There is always a chance that your pain could be related to something serious, such as a heart attack or a blood clot in your lungs. Chest pain can also be caused by conditions that are not life-threatening. If you have chest pain, it is very important to follow up with your health care provider. °CAUSES  °Chest pain can be caused by: °· Heartburn. °· Pneumonia or bronchitis. °· Anxiety or stress. °· Inflammation around your heart (pericarditis) or lung (pleuritis or pleurisy). °· A blood clot in your lung. °· A collapsed lung (pneumothorax). It can develop suddenly on its own (spontaneous pneumothorax) or from trauma to the chest. °· Shingles infection (varicella-zoster virus). °· Heart attack. °· Damage to the bones, muscles, and cartilage that make up your chest wall. This can include: °¨ Bruised bones due to injury. °¨ Strained muscles or cartilage due to frequent or repeated coughing or overwork. °¨ Fracture to one or more ribs. °¨ Sore cartilage due to inflammation (costochondritis). °RISK FACTORS  °Risk factors for chest pain may include: °· Activities that increase your risk for trauma or injury to your chest. °· Respiratory infections or conditions that cause frequent coughing. °· Medical conditions or overeating that can cause heartburn. °· Heart disease or family history of heart disease. °· Conditions or health behaviors that increase your risk of developing a blood clot. °· Having had chicken pox (varicella zoster). °SIGNS AND SYMPTOMS °Chest pain can feel like: °· Burning or tingling on the surface of your chest or deep in your chest. °· Crushing, pressure, aching, or squeezing pain. °· Dull or sharp pain that is worse when you move, cough, or take a deep breath. °· Pain that is also felt in your back, neck, shoulder, or arm, or pain that spreads to any of these areas. °Your chest pain may come and go, or it may stay  constant. °DIAGNOSIS °Lab tests or other studies may be needed to find the cause of your pain. Your health care provider may have you take a test called an ambulatory ECG (electrocardiogram). An ECG records your heartbeat patterns at the time the test is performed. You may also have other tests, such as: °· Transthoracic echocardiogram (TTE). During echocardiography, sound waves are used to create a picture of all of the heart structures and to look at how blood flows through your heart. °· Transesophageal echocardiogram (TEE). This is a more advanced imaging test that obtains images from inside your body. It allows your health care provider to see your heart in finer detail. °· Cardiac monitoring. This allows your health care provider to monitor your heart rate and rhythm in real time. °· Holter monitor. This is a portable device that records your heartbeat and can help to diagnose abnormal heartbeats. It allows your health care provider to track your heart activity for several days, if needed. °· Stress tests. These can be done through exercise or by taking medicine that makes your heart beat more quickly. °· Blood tests. °· Imaging tests. °TREATMENT  °Your treatment depends on what is causing your chest pain. Treatment may include: °· Medicines. These may include: °¨ Acid blockers for heartburn. °¨ Anti-inflammatory medicine. °¨ Pain medicine for inflammatory conditions. °¨ Antibiotic medicine, if an infection is present. °¨ Medicines to dissolve blood clots. °¨ Medicines to treat coronary artery disease. °· Supportive care for conditions that do not require medicines. This may include: °¨ Resting. °¨ Applying heat   or cold packs to injured areas. °¨ Limiting activities until pain decreases. °HOME CARE INSTRUCTIONS °· If you were prescribed an antibiotic medicine, finish it all even if you start to feel better. °· Avoid any activities that bring on chest pain. °· Do not use any tobacco products, including  cigarettes, chewing tobacco, or electronic cigarettes. If you need help quitting, ask your health care provider. °· Do not drink alcohol. °· Take medicines only as directed by your health care provider. °· Keep all follow-up visits as directed by your health care provider. This is important. This includes any further testing if your chest pain does not go away. °· If heartburn is the cause for your chest pain, you may be told to keep your head raised (elevated) while sleeping. This reduces the chance that acid will go from your stomach into your esophagus. °· Make lifestyle changes as directed by your health care provider. These may include: °¨ Getting regular exercise. Ask your health care provider to suggest some activities that are safe for you. °¨ Eating a heart-healthy diet. A registered dietitian can help you to learn healthy eating options. °¨ Maintaining a healthy weight. °¨ Managing diabetes, if necessary. °¨ Reducing stress. °SEEK MEDICAL CARE IF: °· Your chest pain does not go away after treatment. °· You have a rash with blisters on your chest. °· You have a fever. °SEEK IMMEDIATE MEDICAL CARE IF:  °· Your chest pain is worse. °· You have an increasing cough, or you cough up blood. °· You have severe abdominal pain. °· You have severe weakness. °· You faint. °· You have chills. °· You have sudden, unexplained chest discomfort. °· You have sudden, unexplained discomfort in your arms, back, neck, or jaw. °· You have shortness of breath at any time. °· You suddenly start to sweat, or your skin gets clammy. °· You feel nauseous or you vomit. °· You suddenly feel light-headed or dizzy. °· Your heart begins to beat quickly, or it feels like it is skipping beats. °These symptoms may represent a serious problem that is an emergency. Do not wait to see if the symptoms will go away. Get medical help right away. Call your local emergency services (911 in the [place] Do not drive yourself to the hospital. °  °This  information is not intended to replace advice given to you by your health care provider. Make sure you discuss any questions you have with your health care provider. °  °Document Released: 09/04/2005 Document Revised: 12/16/2014 Document Reviewed: 07/01/2014 °[employer] Interactive Patient Education ©2016 [employer] ° °

## 2015-09-29 NOTE — ED Provider Notes (Signed)
CSN: [id number]   Arrival date & time 09/29/15 0310  History  By signing my name below, I, Bethel BornBritney McCollum, attest that this documentation has been prepared under the direction and in the presence of Loren Raceravid Suheyb Raucci, MD. Electronically Signed: Bethel BornBritney McCollum, ED Scribe. 09/29/2015. 3:54 AM.  Chief Complaint  Patient presents with  . Chest Pain    HPI The history is provided by the patient. No language interpreter was used.   Tony Howard is a 54 y.o. male with history of HTN who presents to the Emergency Department complaining of new and constant left-sided chest pain with sudden onset 2 hours ago. He describes the pain as cramping and notes that it has improved since initial onset.  Associated symptoms include diaphoresis, SOB, and BLE pain and swelling.  No recent long travel or tobacco use.   Past Medical History  Diagnosis Date  . Chronic pain   . Hypertension     echo 4/13:  mild LVH, EF 65-70%, grade 1 diast dysfxn, mild LAE  . Osteoarthritis   . Sarcoidosis of lung (HCC)     Myoview 3/13 with EF 47%; anterior ischemia;  LHC without CAD;  MRI recommended to r/o cardiac sarcoid - but patient claustrophobic and MRI not done  . Aneurysm of heart     LHC 03/13/12:  Normal cors, EF 60-65% with small but definite apical aneurysm.  He was started on coumadin as thrombus formation was a high probability  . History of stroke   . Cutaneous sarcoidosis (HCC)   . CVA (cerebral infarction)     Head CT showing encephalomalacia c/w old infarct in the distribution of the right PCA in April 2013  . DJD (degenerative joint disease)     Past Surgical History  Procedure Laterality Date  . Knee surgery    . Cystoscopy w/ ureteral stent placement    . Left heart catheterization with coronary angiogram N/A 03/13/2012    Procedure: LEFT HEART CATHETERIZATION WITH CORONARY ANGIOGRAM;  Surgeon: Vesta MixerPhilip J Nahser, MD;  [facility] C[facility] LAB;  Service: Cardiovascular;  Laterality: N/A;  Contrast  allergy    Family History  Problem Relation Age of Onset  . Lung cancer Mother   . Multiple sclerosis Sister     Social History  Substance Use Topics  . Smoking status: Never Smoker   . Smokeless tobacco: Never Used  . Alcohol Use: Yes     Comment: 3 everys every 2 weeks     Review of Systems  Constitutional: Negative for fever and chills.  Respiratory: Positive for shortness of breath. Negative for cough and wheezing.   Cardiovascular: Positive for chest pain and leg swelling. Negative for palpitations.  Gastrointestinal: Negative for nausea, vomiting, abdominal pain, diarrhea and constipation.  Musculoskeletal: Negative for myalgias, back pain, neck pain and neck stiffness.  Skin: Negative for rash and wound.  Neurological: Negative for dizziness, weakness, light-headedness, numbness and headaches.  All other systems reviewed and are negative.  Home Medications   Prior to Admission medications   Medication Sig Start Date End Date Taking? Authorizing Provider  carvedilol (COREG) 12.5 MG tablet TAKE ONE-HALF TABLET BY MOUTH TWICE DAILY WITH  A  MEAL 05/30/15  Yes Laurey Moralealton S McLean, MD  naproxen sodium (ANAPROX) 220 MG tablet Take 220 mg by mouth 2 (two) times daily with a meal.   Yes Historical Provider, MD  warfarin (COUMADIN) 10 MG tablet TAKE AS DIRECTED BY COUMADIN CLINIC Patient taking differently: Take 10 mg by mouth daily  at 6 PM. TAKE AS DIRECTED BY COUMADIN CLINIC 04/25/15  Yes Laurey Morale, MD  atorvastatin (LIPITOR) 20 MG tablet Take 1 tablet (20 mg total) by mouth daily. Patient not taking: Reported on 09/29/2015 09/27/14   Laurey Morale, MD  carvedilol (COREG) 12.5 MG tablet TAKE ONE-HALF TABLET BY MOUTH TWICE DAILY WITH A MEAL Patient not taking: Reported on 09/29/2015 08/25/15   Laurey Morale, MD  furosemide (LASIX) 20 MG tablet Take 1 tablet (20 mg total) by mouth daily. Patient not taking: Reported on 09/29/2015 03/18/14   Rosalio Macadamia, NP   hydrochlorothiazide (HYDRODIURIL) 25 MG tablet Take 1 tablet (25 mg total) by mouth daily. 09/29/15   Loren Racer, MD  lisinopril (PRINIVIL,ZESTRIL) 30 MG tablet Take 1 tablet (30 mg total) by mouth daily. 09/29/15   Loren Racer, MD  potassium chloride (K-DUR,KLOR-CON) 10 MEQ tablet Take 1 tablet (10 mEq total) by mouth daily. Patient not taking: Reported on 09/29/2015 03/18/14   Rosalio Macadamia, NP  traMADol (ULTRAM) 50 MG tablet Take 1 tablet (50 mg total) by mouth 2 (two) times daily. Patient not taking: Reported on 09/29/2015 09/27/14   Laurey Morale, MD    Allergies  Naprosyn and Iodine  Triage Vitals: BP 127/81 mmHg  Pulse 54  Temp(Src) 97.5 F (36.4 C) (Oral)  Resp 21  SpO2 100%  Physical Exam  Constitutional: He is oriented to person, place, and time. He appears well-developed and well-nourished. No distress.  HENT:  Head: Normocephalic and atraumatic.  Mouth/Throat: Oropharynx is clear and moist.  Eyes: EOM are normal. Pupils are equal, round, and reactive to light.  Neck: Normal range of motion. Neck supple.  Cardiovascular: Normal rate and regular rhythm.   Pulmonary/Chest: Effort normal and breath sounds normal. No respiratory distress. He has no wheezes. He has no rales. He exhibits tenderness (chest tenderness is reproduced with palpation over the left pectoralis muscle. No crepitance or deformity.).  Abdominal: Soft. Bowel sounds are normal. He exhibits no distension and no mass. There is no tenderness. There is no rebound and no guarding.  Musculoskeletal: Normal range of motion. He exhibits no edema or tenderness.  Very mild bilateral lower extremity swelling. No calf tenderness. Distal pulses intact.  Neurological: He is alert and oriented to person, place, and time.  Skin: Skin is warm and dry. No rash noted. No erythema.  Psychiatric: He has a normal mood and affect. His behavior is normal.  Nursing note and vitals reviewed.   ED Course   Procedures   DIAGNOSTIC STUDIES: Oxygen Saturation is 100% on RA, normal by my interpretation.    COORDINATION OF CARE: 3:48 AM Discussed treatment plan which includes lab work, CXR, EKG, and pain management with pt at bedside and pt agreed to plan.  Labs Reviewed  BASIC METABOLIC PANEL - Abnormal; Notable for the following:    Glucose, Bld 110 (*)    BUN 22 (*)    All other components within normal limits  PROTIME-INR - Abnormal; Notable for the following:    Prothrombin Time 31.0 (*)    INR 3.06 (*)    All other components within normal limits  CBC  D-DIMER, QUANTITATIVE (NOT AT [facility])  I-STAT TROPOININ, ED  I-STAT TROPOININ, ED    Imaging Review Dg Chest 2 View  09/29/2015  CLINICAL DATA:  Awoke from sleep with chest pain radiating down left arm. EXAM: CHEST  2 VIEW COMPARISON:  CT 11/09/2014 FINDINGS: Lung volumes are low. Heart appears normal  in size, there is bilateral hilar prominence. Basilar atelectasis is seen on the lateral view. No confluent airspace disease. No pulmonary edema, pleural effusion or pneumothorax. Degenerative change seen throughout spine. IMPRESSION: Hypoventilatory chest. Bilateral hilar prominence, likely corresponding to adenopathy as seen on prior exam and related to sarcoidosis. Electronically Signed   By: Rubye Oaks M.D.   On: 09/29/2015 03:53    I personally reviewed and evaluated these images and lab results as a part of my medical decision-making.   EKG Interpretation  Date/Time:  Friday September 29 2015 03:52:22 EDT Ventricular Rate:  69 PR Interval:  174 QRS Duration: 90 QT Interval:  413 QTC Calculation: 442 R Axis:   0 Text Interpretation:  Sinus rhythm Abnormal R-wave progression, early transition Borderline T abnormalities, inferior leads Confirmed by Ranae Palms  MD, Dannisha Eckmann ([id number]) on 09/29/2015 6:25:20 AM    MDM   Final diagnoses:  Chest pain, unspecified chest pain type     I, Annitta Fifield, personally performed  the services described in this documentation. All medical record entries made by the scribe were at my direction and in my presence.  I have reviewed the chart and discharge instructions and agree that the record reflects my personal performance and is accurate and complete. Yash Cacciola.  09/29/2015. 8:01 AM.    Patient remained chest pain-free in the emergency department. Reproduced with palpation the chest. EKG is not ischemic. Troponin 2 is normal. Normal d-dimer. Nonobstructive coronary arteries by cath in 2013. We refill patient's hypertensive medication. Understands need to follow-up with his cardiologist. Return precautions have been given.  Loren Racer, MD 09/29/15 [phone]

## 2015-10-05 ENCOUNTER — Ambulatory Visit (INDEPENDENT_AMBULATORY_CARE_PROVIDER_SITE_OTHER): Payer: Medicaid Other | Admitting: *Deleted

## 2015-10-05 DIAGNOSIS — Z7902 Long term (current) use of antithrombotics/antiplatelets: Secondary | ICD-10-CM

## 2015-10-05 DIAGNOSIS — D869 Sarcoidosis, unspecified: Secondary | ICD-10-CM

## 2015-10-05 DIAGNOSIS — Z7901 Long term (current) use of anticoagulants: Secondary | ICD-10-CM

## 2015-10-05 DIAGNOSIS — I253 Aneurysm of heart: Secondary | ICD-10-CM | POA: Diagnosis not present

## 2015-10-05 LAB — POCT INR: INR: 2.3

## 2015-10-05 MED ORDER — WARFARIN SODIUM 10 MG PO TABS: ORAL_TABLET | ORAL | Status: DC

## 2015-10-06 ENCOUNTER — Ambulatory Visit
Admission: RE | Admit: 2015-10-06 | Discharge: 2015-10-06 | Disposition: A | Discharge status: Home / Self Care | Payer: Medicaid Other | Source: Ambulatory Visit | Attending: Family Medicine | Admitting: Family Medicine

## 2015-10-06 ENCOUNTER — Other Ambulatory Visit: Payer: Self-pay | Admitting: Family Medicine

## 2015-10-06 DIAGNOSIS — M1990 Unspecified osteoarthritis, unspecified site: Secondary | ICD-10-CM

## 2015-11-27 ENCOUNTER — Encounter: Payer: Self-pay | Admitting: *Deleted

## 2015-11-27 ENCOUNTER — Ambulatory Visit (INDEPENDENT_AMBULATORY_CARE_PROVIDER_SITE_OTHER): Payer: Medicaid Other | Admitting: Cardiology

## 2015-11-27 ENCOUNTER — Encounter: Payer: Self-pay | Admitting: Cardiology

## 2015-11-27 VITALS — BP 112/68 | HR 72 | Ht 76.0 in | Wt 299.8 lb

## 2015-11-27 DIAGNOSIS — Z91199 Patient's noncompliance with other medical treatment and regimen due to unspecified reason: Secondary | ICD-10-CM

## 2015-11-27 DIAGNOSIS — Z9119 Patient's noncompliance with other medical treatment and regimen: Secondary | ICD-10-CM | POA: Diagnosis not present

## 2015-11-27 DIAGNOSIS — I253 Aneurysm of heart: Secondary | ICD-10-CM | POA: Diagnosis not present

## 2015-11-27 DIAGNOSIS — R079 Chest pain, unspecified: Secondary | ICD-10-CM | POA: Diagnosis not present

## 2015-11-27 DIAGNOSIS — D869 Sarcoidosis, unspecified: Secondary | ICD-10-CM

## 2015-11-27 LAB — LIPID PANEL
CHOL/HDL RATIO: 5.4 ratio — AB (ref <=5.0)
Cholesterol: 226 mg/dL — ABNORMAL HIGH (ref 125–200)
HDL: 42 mg/dL (ref >=40)
LDL CALC: 142 mg/dL — AB (ref <130)
Triglycerides: 211 mg/dL — ABNORMAL HIGH (ref <150)
VLDL: 42 mg/dL — AB (ref <30)

## 2015-11-27 LAB — BASIC METABOLIC PANEL
BUN: 20 mg/dL (ref 7–25)
CHLORIDE: 105 mmol/L (ref 98–110)
CO2: 23 mmol/L (ref 20–31)
CREATININE: 1.29 mg/dL (ref 0.70–1.33)
Calcium: 9.2 mg/dL (ref 8.6–10.3)
GLUCOSE: 87 mg/dL (ref 65–99)
POTASSIUM: 3.9 mmol/L (ref 3.5–5.3)
SODIUM: 137 mmol/L (ref 135–146)

## 2015-11-27 NOTE — Patient Instructions (Signed)
Medication Instructions:  No changes today  Labwork: BMET/Lipid profile today  Testing/Procedures: Your physician has requested that you have an echocardiogram. Echocardiography is a painless test that uses sound waves to create images of your heart. It provides your doctor with information about the size and shape of your heart and how well your heart's chambers and valves are working. This procedure takes approximately one hour. There are no restrictions for this procedure.  Your physician has requested that you have a lexiscan myoview. For further information please visit [email or web address]. Please follow instruction sheet, as given.     Follow-Up: Your physician wants you to follow-up in: 6 months with Dr Shirlee LatchMcLean. (June 2017).You will receive a reminder letter in the mail two months in advance. If you don't receive a letter, please call our office to schedule the follow-up appointment.   Any Other Special Instructions Will Be Listed Below (If Applicable).  You have been referred to Dr Kendrick FriesMcQuaid in [facility]      If you need a refill on your cardiac medications before your next appointment, please call your pharmacy.

## 2015-11-28 DIAGNOSIS — R079 Chest pain, unspecified: Secondary | ICD-10-CM | POA: Insufficient documentation

## 2015-11-28 NOTE — Progress Notes (Signed)
Patient ID: Tony Howard, male   DOB: 01/01/1961, 54 y.o.   MRN: [id number] PCP: Dr. 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  His main complaint in the past had been dyspnea.  He did not appear volume overloaded on exam and Dr. Shelle Ironlance did not think that his sarcoid was responsible for his exertional dyspnea (not much parenchymal disease on CT).  Most recent echo in 10/15 showed EF 55-60% with mild LVH. He was evaluated by pulmonary last in 12/15.  CT chest at that time showed hilar and mediastinal adenopathy.   Main complaint currently is hip pain on the left.  He is using a walker and has an appointment with orthopedics soon.  No dyspnea walking on flat ground with his walker.  Dyspnea and hip pain walking up stairs.  No orthopnea/PND.  No palpitations.  No snoring/daytime sleepiness.  No cough or wheeze. He has had left-sided chest aching, on and off.  It is not related to exertion.  Sometimes it is prolonged.  Not severe.   Labs (4/15): K 4.1, creatinine 1.2, LDL 168 Labs (10/15): ACE level 4 (low), K 4.7, creatinine 1.5, pro-BNP 12  ECG: NSR, nonspecific T wave flattening  PMH:  1. Sarcoidosis: Pulmonary and cutaneous.  CT chest in 4/13 showed hilar and mediastinal lymphadenopathy but no significant parenchymal involvement.  PFTs (4/13) showed mild restriction.  Patient follows with Dr. Shelle Ironlance. Suspect cardiac involvement with non-CAD-related LV aneurysm.  PFTs (11/15): FVC 58%, FEV1 59%, ration 101%, TLC 57%, DLCO 53% (moderate-severe obstruction, severe parenchymal restriction).  CT chest (12/15) with bilateral hilar and mediastinal lymphadenopathy, pleural-based nodules.  2. HTN 3. OA hip with severe pain.  4. H/o CVA 5. Contrast allergy 6. Cardiomyopathy: Admitted in 4/13 with CP.  Myoview showed EF 47% with anterior ischemia. Echo showed EF 65%, apex poorly visualized.  LHC (4/13) showed no significant  coronary disease but there was a small LV apical aneurysm. LV aneurysms have been seen with cardiac sarcoidosis, and I suspect this is the cause.  We planned an MRI to evaluate but patient was too claustrophobic and could not do the study.  Lexiscan Cardiolite (7/14) with EF 47%, no ischemia or infarction.  Echo (7/14) with EF 55-60%, mild MR, PA systolic pressure 33 mmHg, normal RV size and systolic function, LV apical aneurysm was not visualized on this echo.  Echo (10/15) with EF 55-60%, mild LVH, LV apical aneurysm was not visualized.   FH: Sister with multiple sclerosis.   SH: Married, nonsmoker, unemployed (used to work in [employer]).   ROS: All systems reviewed and negative except as per HPI.   Current Outpatient Prescriptions  Medication Sig Dispense Refill  . atorvastatin (LIPITOR) 20 MG tablet Take 20 mg by mouth daily.    . carvedilol (COREG) 12.5 MG tablet TAKE ONE-HALF TABLET BY MOUTH TWICE DAILY WITH  A  MEAL 30 tablet 0  . furosemide (LASIX) 20 MG tablet Take 20 mg by mouth daily.    . hydrochlorothiazide (HYDRODIURIL) 25 MG tablet Take 1 tablet (25 mg total) by mouth daily. 30 tablet 0  . ibuprofen (ADVIL,MOTRIN) 800 MG tablet Take 1 tablet by mouth 3 (three) times daily.  2  . lisinopril (PRINIVIL,ZESTRIL) 30 MG tablet Take 1 tablet (30 mg total) by mouth daily. 30 tablet 0  . OXYCONTIN 20 MG 12 hr tablet Take 1 tablet by mouth every 12 (twelve) hours as needed.  Pain  0  . potassium chloride (K-DUR,KLOR-CON) 10 MEQ tablet Take 1 tablet (10 mEq total) by mouth daily. 30 tablet 11  . traMADol (ULTRAM) 50 MG tablet Take 50 mg by mouth 2 (two) times daily.    Marland Kitchen warfarin (COUMADIN) 10 MG tablet Take as directed by the coumadin clinic     No current facility-administered medications for this visit.    BP 112/68 mmHg  Pulse 72  Ht  (1.93 m)  Wt 299 lb 12.8 oz (135.988 kg)  BMI 36.51 kg/m2 General: NAD, obese.  Neck: JVP 7 cm, no thyromegaly or thyroid nodule.  Lungs:  Clear to auscultation bilaterally with normal respiratory effort. CV: Nondisplaced PMI.  Heart regular S1/S2, no S3/S4, no murmur.  No edema.  No carotid bruit.  Normal pedal pulses.  Abdomen: Soft, nontender, no hepatosplenomegaly, no distention.  Neurologic: Alert/oriented x 3 Psych: Normal affect. Extremities: No clubbing or cyanosis.   Assessment/Plan: 1. Cardiomyopathy: Patient has been noted in the past to have a small LV apical aneurysm.  He was unable to do a cardiac MRI due to claustrophobia.  He had no coronary disease on angiography in the past.  I suspect that the aneurysm was caused by cardiac sarcoidosis.  LV aneurysm formation has been described with cardiac sarcoidosis.  He had a CVA in the past so is on chronic coumadin.  Most recent echo in 10/15 did not visualize the LV apical aneurysm.  - I will get repeat echo to followup on aneurysm.  2. Chest pain: Atypical chest pain that began fairly recently.  I will arrange for Lexiscan Cardiolite to risk stratify.  He had coronary angiography in 2013 without significant CAD.   3. Hyperlipidemia: He is now on atorvastatin.  Will check lipids today.  4. Sarcoidosis: There was evidence for sarcoidosis on 12/15 CT.  He used to see Dr Shelle Iron who has now retired.  He needs pulmonary followup, I will arrange.   [name] 11/28/2015

## 2015-11-29 ENCOUNTER — Telehealth: Payer: Self-pay | Admitting: Cardiology

## 2015-11-29 ENCOUNTER — Other Ambulatory Visit: Payer: Self-pay | Admitting: *Deleted

## 2015-11-29 DIAGNOSIS — R0602 Shortness of breath: Secondary | ICD-10-CM

## 2015-11-29 DIAGNOSIS — E785 Hyperlipidemia, unspecified: Secondary | ICD-10-CM

## 2015-11-29 DIAGNOSIS — R001 Bradycardia, unspecified: Secondary | ICD-10-CM

## 2015-11-29 DIAGNOSIS — R079 Chest pain, unspecified: Secondary | ICD-10-CM

## 2015-11-29 MED ORDER — WARFARIN SODIUM 10 MG PO TABS: ORAL_TABLET | ORAL | Status: DC

## 2015-11-29 MED ORDER — HYDROCHLOROTHIAZIDE 25 MG PO TABS: 25.0000 mg | ORAL_TABLET | Freq: Every day | ORAL | Status: DC

## 2015-11-29 MED ORDER — ATORVASTATIN CALCIUM 40 MG PO TABS: 40.0000 mg | ORAL_TABLET | Freq: Every day | ORAL | Status: DC

## 2015-11-29 MED ORDER — POTASSIUM CHLORIDE CRYS ER 10 MEQ PO TBCR: 10.0000 meq | EXTENDED_RELEASE_TABLET | Freq: Every day | ORAL | Status: DC

## 2015-11-29 MED ORDER — LISINOPRIL 30 MG PO TABS: 30.0000 mg | ORAL_TABLET | Freq: Every day | ORAL | Status: DC

## 2015-11-29 MED ORDER — CARVEDILOL 12.5 MG PO TABS: ORAL_TABLET | ORAL | Status: DC

## 2015-11-29 MED ORDER — FUROSEMIDE 20 MG PO TABS: 20.0000 mg | ORAL_TABLET | Freq: Every day | ORAL | Status: DC

## 2015-11-29 NOTE — Telephone Encounter (Signed)
New message ° ° ° ° °Returning a call to the nurse °

## 2015-11-29 NOTE — Telephone Encounter (Signed)
Spoke with pt and he states does not have any more coumadin miss dose yesterday so pt instructed to take extra 1/2 tablet today Dec 21st then continue same dose coumadin 10 mg daily except 5mg  on Mondays and Fridays and appt made for pt to be seen on Jan 5th and rest of refill will be done then and pt states understanding

## 2015-11-29 NOTE — Telephone Encounter (Signed)
I spoke with pt and reviewed lab results and recommendations from Dr. Shirlee LatchMcLean with him. Will send prescription for increased dose of Atorvastatin to [facility]. Pt will come in for fasting lab work on 01/29/16. He also [facility] Will refill.

## 2015-12-12 ENCOUNTER — Telehealth (HOSPITAL_COMMUNITY): Payer: Self-pay | Admitting: *Deleted

## 2015-12-12 NOTE — Telephone Encounter (Signed)
Patient given detailed instructions per Myocardial Perfusion Study Information Sheet for the test on 12/14/15 at 1230. Patient notified to arrive 15 minutes early and that it is imperative to arrive on time for appointment to keep from having the test rescheduled.  If you need to cancel or reschedule your appointment, please call the office within 24 hours of your appointment. Failure to do so may result in a cancellation of your appointment, and a $50 no show fee. Patient verbalized understanding.Tony Howard W    

## 2015-12-14 ENCOUNTER — Ambulatory Visit (HOSPITAL_BASED_OUTPATIENT_CLINIC_OR_DEPARTMENT_OTHER): Payer: Medicaid Other

## 2015-12-14 ENCOUNTER — Ambulatory Visit (INDEPENDENT_AMBULATORY_CARE_PROVIDER_SITE_OTHER): Payer: Medicaid Other | Admitting: *Deleted

## 2015-12-14 ENCOUNTER — Ambulatory Visit (HOSPITAL_COMMUNITY): Payer: Medicaid Other | Attending: Cardiovascular Disease

## 2015-12-14 ENCOUNTER — Other Ambulatory Visit: Payer: Self-pay

## 2015-12-14 DIAGNOSIS — I253 Aneurysm of heart: Secondary | ICD-10-CM

## 2015-12-14 DIAGNOSIS — Z7901 Long term (current) use of anticoagulants: Secondary | ICD-10-CM | POA: Diagnosis not present

## 2015-12-14 DIAGNOSIS — R0609 Other forms of dyspnea: Secondary | ICD-10-CM | POA: Insufficient documentation

## 2015-12-14 DIAGNOSIS — I10 Essential (primary) hypertension: Secondary | ICD-10-CM | POA: Diagnosis not present

## 2015-12-14 DIAGNOSIS — D869 Sarcoidosis, unspecified: Secondary | ICD-10-CM

## 2015-12-14 DIAGNOSIS — R9439 Abnormal result of other cardiovascular function study: Secondary | ICD-10-CM | POA: Insufficient documentation

## 2015-12-14 DIAGNOSIS — I517 Cardiomegaly: Secondary | ICD-10-CM | POA: Insufficient documentation

## 2015-12-14 DIAGNOSIS — Z7902 Long term (current) use of antithrombotics/antiplatelets: Secondary | ICD-10-CM

## 2015-12-14 LAB — MYOCARDIAL PERFUSION IMAGING
CHL CUP NUCLEAR SRS: 6
CHL CUP NUCLEAR SSS: 6
CSEPPHR: 85 {beats}/min
LHR: 0.38
LV dias vol: 120 mL
LVSYSVOL: 57 mL
Rest HR: 58 {beats}/min
SDS: 0
TID: 0.98

## 2015-12-14 LAB — POCT INR: INR: 1.8

## 2015-12-14 MED ORDER — REGADENOSON 0.4 MG/5ML IV SOLN
0.4000 mg | Freq: Once | INTRAVENOUS | Status: AC
  Administered 2015-12-14: 0.4 mg via INTRAVENOUS

## 2015-12-14 MED ORDER — WARFARIN SODIUM 10 MG PO TABS: ORAL_TABLET | ORAL | Status: DC

## 2015-12-14 MED ORDER — TECHNETIUM TC 99M SESTAMIBI GENERIC - CARDIOLITE
11.0000 | Freq: Once | INTRAVENOUS | Status: AC | PRN
  Administered 2015-12-14: 11 via INTRAVENOUS

## 2015-12-14 MED ORDER — TECHNETIUM TC 99M SESTAMIBI GENERIC - CARDIOLITE
32.9000 | Freq: Once | INTRAVENOUS | Status: AC | PRN
  Administered 2015-12-14: 32.9 via INTRAVENOUS

## 2015-12-15 ENCOUNTER — Ambulatory Visit (HOSPITAL_COMMUNITY): Payer: Medicaid Other

## 2016-01-08 MED FILL — WARFARIN NA 10 MG TAB: 10 | 30 days supply | Qty: 30 | Fill #0

## 2016-01-18 MED FILL — OxyCONTIN 20 MG T12A: 20 | 30 days supply | Qty: 60 | Fill #0

## 2016-01-23 MED FILL — IBUPROFEN 800 MG TABLET: 800 | 30 days supply | Qty: 90 | Fill #1

## 2016-01-23 MED FILL — CARISOPRODOL 350 MG TABLET: 350 | 30 days supply | Qty: 90 | Fill #0

## 2016-01-29 ENCOUNTER — Other Ambulatory Visit: Payer: Medicaid Other

## 2016-02-06 ENCOUNTER — Ambulatory Visit (INDEPENDENT_AMBULATORY_CARE_PROVIDER_SITE_OTHER): Payer: Medicaid Other | Admitting: Pharmacist

## 2016-02-06 ENCOUNTER — Other Ambulatory Visit: Payer: Medicaid Other

## 2016-02-06 ENCOUNTER — Other Ambulatory Visit (INDEPENDENT_AMBULATORY_CARE_PROVIDER_SITE_OTHER): Payer: Medicaid Other

## 2016-02-06 DIAGNOSIS — D869 Sarcoidosis, unspecified: Secondary | ICD-10-CM

## 2016-02-06 DIAGNOSIS — Z7902 Long term (current) use of antithrombotics/antiplatelets: Secondary | ICD-10-CM

## 2016-02-06 DIAGNOSIS — E785 Hyperlipidemia, unspecified: Secondary | ICD-10-CM | POA: Diagnosis not present

## 2016-02-06 DIAGNOSIS — Z7901 Long term (current) use of anticoagulants: Secondary | ICD-10-CM | POA: Diagnosis not present

## 2016-02-06 DIAGNOSIS — I253 Aneurysm of heart: Secondary | ICD-10-CM | POA: Diagnosis not present

## 2016-02-06 LAB — POCT INR: INR: 3.9

## 2016-02-07 LAB — LIPID PANEL
CHOLESTEROL: 186 mg/dL (ref 125–200)
HDL: 43 mg/dL (ref >=40)
LDL CALC: 115 mg/dL (ref <130)
TRIGLYCERIDES: 141 mg/dL (ref <150)
Total CHOL/HDL Ratio: 4.3 Ratio (ref <=5.0)
VLDL: 28 mg/dL (ref <30)

## 2016-02-07 LAB — HEPATIC FUNCTION PANEL
ALBUMIN: 3.1 g/dL — AB (ref 3.6–5.1)
ALK PHOS: 75 U/L (ref 40–115)
ALT: 25 U/L (ref 9–46)
AST: 21 U/L (ref 10–35)
Bilirubin, Direct: 0.1 mg/dL (ref <=0.2)
Indirect Bilirubin: 0.3 mg/dL (ref 0.2–1.2)
TOTAL PROTEIN: 6 g/dL — AB (ref 6.1–8.1)
Total Bilirubin: 0.4 mg/dL (ref 0.2–1.2)

## 2016-03-05 ENCOUNTER — Ambulatory Visit: Payer: Medicaid Other | Admitting: Pulmonary Disease

## 2016-03-06 MED FILL — WARFARIN NA 10 MG TAB: 10 | 30 days supply | Qty: 30 | Fill #1

## 2016-03-12 MED FILL — OxyCONTIN 20 MG T12A: 20 | 30 days supply | Qty: 60 | Fill #0

## 2016-05-20 MED FILL — WARFARIN NA 10 MG TAB: 10 | 30 days supply | Qty: 30 | Fill #2

## 2016-05-22 ENCOUNTER — Ambulatory Visit (INDEPENDENT_AMBULATORY_CARE_PROVIDER_SITE_OTHER): Payer: Medicaid Other | Admitting: *Deleted

## 2016-05-22 DIAGNOSIS — D869 Sarcoidosis, unspecified: Secondary | ICD-10-CM

## 2016-05-22 DIAGNOSIS — Z7901 Long term (current) use of anticoagulants: Secondary | ICD-10-CM | POA: Diagnosis not present

## 2016-05-22 DIAGNOSIS — I253 Aneurysm of heart: Secondary | ICD-10-CM | POA: Diagnosis not present

## 2016-05-22 DIAGNOSIS — Z7902 Long term (current) use of antithrombotics/antiplatelets: Secondary | ICD-10-CM | POA: Diagnosis not present

## 2016-05-22 LAB — POCT INR: INR: 3.3

## 2016-05-22 MED ORDER — WARFARIN SODIUM 10 MG PO TABS: ORAL_TABLET | ORAL | Status: DC

## 2016-05-23 MED FILL — OxyCONTIN 20 MG T12A: 20 | 30 days supply | Qty: 60 | Fill #0

## 2016-06-03 ENCOUNTER — Telehealth: Payer: Self-pay | Admitting: Cardiology

## 2016-06-03 NOTE — Telephone Encounter (Signed)
F/u Message  Pt call stating he was returning a call. Did not find a note. But pt stated there was a call. Please call back to discuss, if needed

## 2016-06-03 NOTE — Telephone Encounter (Signed)
Spoke with pt and advised I did not see a note in the system where someone had called him. Pt states he actually just got a call from the automated system to remind him of his CVRR appt and he believes it may have been that the first time. Pt does not have VM so you can't leave a message. Pt appreciative for call back.

## 2016-06-12 ENCOUNTER — Telehealth: Payer: Self-pay | Admitting: Cardiology

## 2016-06-12 NOTE — Telephone Encounter (Signed)
Spoke with patient and answered questions about upcoming appt with Jacolyn ReedyMichele Lenze 07/01/16

## 2016-06-12 NOTE — Telephone Encounter (Signed)
New Message   Pt call requesting to speak with RN about appt schedule with PA on 7/24. Pt states he was confused and would like a call back. Please callback to discuss

## 2016-06-13 ENCOUNTER — Ambulatory Visit: Payer: Medicaid Other | Admitting: Physician Assistant

## 2016-06-24 MED FILL — WARFARIN NA 10 MG TAB: 10 | 30 days supply | Qty: 30 | Fill #0

## 2016-07-01 ENCOUNTER — Encounter: Payer: Self-pay | Admitting: Physician Assistant

## 2016-07-01 ENCOUNTER — Ambulatory Visit (INDEPENDENT_AMBULATORY_CARE_PROVIDER_SITE_OTHER): Payer: Medicaid Other | Admitting: Physician Assistant

## 2016-07-01 ENCOUNTER — Encounter (INDEPENDENT_AMBULATORY_CARE_PROVIDER_SITE_OTHER): Payer: Self-pay

## 2016-07-01 VITALS — BP 142/100 | Ht 76.0 in | Wt 287.4 lb

## 2016-07-01 DIAGNOSIS — I10 Essential (primary) hypertension: Secondary | ICD-10-CM

## 2016-07-01 DIAGNOSIS — R0609 Other forms of dyspnea: Secondary | ICD-10-CM | POA: Diagnosis not present

## 2016-07-01 DIAGNOSIS — D869 Sarcoidosis, unspecified: Secondary | ICD-10-CM

## 2016-07-01 DIAGNOSIS — I2510 Atherosclerotic heart disease of native coronary artery without angina pectoris: Secondary | ICD-10-CM | POA: Diagnosis not present

## 2016-07-01 DIAGNOSIS — Z01810 Encounter for preprocedural cardiovascular examination: Secondary | ICD-10-CM | POA: Diagnosis not present

## 2016-07-01 DIAGNOSIS — I253 Aneurysm of heart: Secondary | ICD-10-CM | POA: Diagnosis not present

## 2016-07-01 DIAGNOSIS — Z7901 Long term (current) use of anticoagulants: Secondary | ICD-10-CM

## 2016-07-01 MED ORDER — LISINOPRIL 40 MG PO TABS: 40.0000 mg | ORAL_TABLET | Freq: Every day | ORAL | 1 refills | Status: DC

## 2016-07-01 NOTE — Progress Notes (Signed)
Cardiology Office Note    Date:  07/01/2016   ID:  [name], DOB 11/04/1961, MRN [id number]  PCP:  Karie Chimera, MD  Cardiologist: Dr. Shirlee Latch  Chief Complaint  Patient presents with  . Follow-up    pt staes he has been coughing a lot more    History of Present Illness:  Tony Howard is a 55 y.o. 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 His main complaint in the past had been dyspnea.  He did not appear volume overloaded on exam and Dr. Shelle Iron did not think that his sarcoid was responsible for his exertional dyspnea (not much parenchymal disease on CT).  Most recent echo in 10/15 showed EF 55-60% with mild LVH. He was evaluated by pulmonary last in 12/15.  CT chest at that time showed hilar and mediastinal adenopathy.   Nuclear stress test in January 2017 was low risk stress nuclear study with small severe intensity partially reversible apical defect and small moderate intensity fixed inferior basal defect consistent with mild apical ischemia and inferior basal thinning, EF 53% with normal wall motion. Dr. Shirlee Latch reviewed this study and recommended a cardiac cath if the patient was having chest pain otherwise medical therapy. Patient was not having pain. 2-D echo was also performed in January that showed normal LV function EF 55-60% with LV apical aneurysm seen in 2013 was not seen on this current study.  Patient comes in today walking with a walker. He is seen Dr. Netta Corrigan today for possible hip replacement. He is wondering if he could be cleared from a cardiac standpoint. He can walk very little because of hip pain. He says he can go about 100 yards before he has to sit down because of the hip pain. He denies any chest pain, dizziness, or presyncope. He has chronic dyspnea on exertion and does have a little bit of leg edema today. His blood pressure is up today that they contribute to pain as he doesn't like to take pain medication.         Past Medical History:  Diagnosis Date  . Aneurysm of heart    LHC 03/13/12:  Normal cors, EF 60-65% with small but definite apical aneurysm.  He was started on coumadin as thrombus formation was a high probability  . Chronic pain   . Cutaneous sarcoidosis (HCC)   . CVA (cerebral infarction)    Head CT showing encephalomalacia c/w old infarct in the distribution of the right PCA in April 2013  . DJD (degenerative joint disease)   . History of stroke   . Hypertension    echo 4/13:  mild LVH, EF 65-70%, grade 1 diast dysfxn, mild LAE  . Osteoarthritis   . Sarcoidosis of lung (HCC)    Myoview 3/13 with EF 47%; anterior ischemia;  LHC without CAD;  MRI recommended to r/o cardiac sarcoid - but patient claustrophobic and MRI not done    Past Surgical History:  Procedure Laterality Date  . CYSTOSCOPY W/ URETERAL STENT PLACEMENT    . KNEE SURGERY    . LEFT HEART CATHETERIZATION WITH CORONARY ANGIOGRAM N/A 03/13/2012   Procedure: LEFT HEART CATHETERIZATION WITH CORONARY ANGIOGRAM;  Surgeon: Vesta Mixer, MD;  Location: [facility] CATH LAB;  Service: Cardiovascular;  Laterality: N/A;  Contrast allergy    Current Medications: Outpatient Medications Prior to Visit  Medication Sig Dispense Refill  . atorvastatin (LIPITOR) 40 MG tablet Take 1 tablet (40 mg total)  by mouth daily. 30 tablet 11  . carvedilol (COREG) 12.5 MG tablet TAKE ONE-HALF TABLET BY MOUTH TWICE DAILY WITH  A  MEAL 30 tablet 11  . furosemide (LASIX) 20 MG tablet Take 1 tablet (20 mg total) by mouth daily. 30 tablet 11  . hydrochlorothiazide (HYDRODIURIL) 25 MG tablet Take 1 tablet (25 mg total) by mouth daily. 30 tablet 11  . ibuprofen (ADVIL,MOTRIN) 800 MG tablet Take 1 tablet by mouth 3 (three) times daily.  2  . OXYCONTIN 20 MG 12 hr tablet Take 1 tablet by mouth every 12 (twelve) hours as needed. Pain  0  . potassium chloride (K-DUR,KLOR-CON) 10 MEQ tablet Take 1 tablet (10 mEq total) by mouth daily. 30 tablet 11  . traMADol  (ULTRAM) 50 MG tablet Take 50 mg by mouth 2 (two) times daily.    Marland Kitchen warfarin (COUMADIN) 10 MG tablet Take as directed by the coumadin clinic 30 tablet 1  . lisinopril (PRINIVIL,ZESTRIL) 30 MG tablet Take 1 tablet (30 mg total) by mouth daily. 30 tablet 11   No facility-administered medications prior to visit.      Allergies:   Naprosyn [naproxen] and Iodine   Social History   Social History  . Marital status: Married    Spouse name: N/A  . Number of children: 1  . Years of education: N/A   Occupational History  .  [employer]   Social History Main Topics  . Smoking status: Never Smoker  . Smokeless tobacco: Never Used  . Alcohol use Yes     Comment: 3 everys every 2 weeks  . Drug use: No  . Sexual activity: Yes    Birth control/ protection: None   Other Topics Concern  . None   Social History Narrative  . None     Family History:  The patient's  family history includes Lung cancer in his mother; Multiple sclerosis in his sister.   ROS:   Please see the history of present illness.    Review of Systems  Constitution: Positive for night sweats.  Cardiovascular: Positive for dyspnea on exertion and leg swelling.  Respiratory: Positive for cough and wheezing.   Hematologic/Lymphatic: Bruises/bleeds easily.  Musculoskeletal: Positive for back pain, joint pain and myalgias.  Neurological: Positive for loss of balance.   All other systems reviewed and are negative.   PHYSICAL EXAM:   VS:  BP (!) 142/100 (BP Location: Left Arm, Patient Position: Sitting, Cuff Size: Large)   Ht  (1.93 m)   Wt 287 lb 6.4 oz (130.4 kg)   BMI 34.98 kg/m   Physical Exam  GEN: Well nourished, well developed, in no acute distress  Neck: no JVD, carotid bruits, or masses Cardiac:RRR; no murmurs, rubs, or gallops  Respiratory:  clear to auscultation bilaterally, normal work of breathing GI: soft, nontender, nondistended, + BS Ext: without cyanosis, clubbing, or edema, Good  distal pulses bilaterally MS: no deformity or atrophy  Skin: warm and dry, no rash Neuro:  Alert and Oriented x 3, Strength and sensation are intact Psych: euthymic mood, full affect  Wt Readings from Last 3 Encounters:  07/01/16 287 lb 6.4 oz (130.4 kg)  11/27/15 299 lb 12.8 oz (136 kg)  11/02/14 297 lb (134.7 kg)      Studies/Labs Reviewed:   EKG:  EKG is  ordered today.  The ekg ordered today demonstrates Normal sinus rhythm  Recent Labs: 09/29/2015: Hemoglobin 14.4; Platelets 153 11/27/2015: BUN 20; Creat 1.29; Potassium 3.9; Sodium 137  02/06/2016: ALT 25   Lipid Panel    Component Value Date/Time   CHOL 186 02/06/2016 1110   TRIG 141 02/06/2016 1110   HDL 43 02/06/2016 1110   CHOLHDL 4.3 02/06/2016 1110   VLDL 28 02/06/2016 1110   LDLCALC 115 02/06/2016 1110   LDLDIRECT 143.9 07/23/2013 0809    Additional studies/ records that were reviewed today include:   Nuclear stress test 12/2015 Notes Recorded by Laurey Morale, MD on 12/15/2015 at 8:57 AM Low risk with small partially reversible apical defect.  Cannot totally rule out ischemia.  If having exertional chest pain, would do cath.  Otherwise, continue to treat medically.       Vitals  Height Weight BMI (Calculated)  6\' 4"  (1.93 m) 299 lb 12.8 oz (136 kg) 36.6  Study Highlights   Nuclear stress EF: 53%.  There was no ST segment deviation noted during stress.  This is a low risk study.  The left ventricular ejection fraction is mildly decreased (45-54%).   Low risk stress nuclear study with small, severe intensity, partially reversible apical defect and small, moderate intensity, fixed inferior basal defect; findings consistent with mild apical ischemia and inferior basal thinning; EF 53 with normal wall motion.      2-D echo 12/2015 Study Conclusions   - Left ventricle: The cavity size was mildly dilated. Wall   thickness was normal. Systolic function was normal. The estimated   ejection fraction was  in the range of 55% to 60%. Wall motion was   normal; there were no regional wall motion abnormalities.   Abnormal relaxation with normal filling pressures. - Mitral valve: Systolic bowing without prolapse. - Left atrium: The atrium was mildly dilated.   Impressions:   - LV apical aneurysm seen on LV angiogram from 2013 is not seen on   the current echo study. Consider repeat imaging with Definity   microbubble contrast.       ASSESSMENT:    1. CAD in native artery   2. Pre-operative cardiovascular examination   3. Aneurysm of heart   4. DOE (dyspnea on exertion)   5. Sarcoidosis (HCC)   6. Long term current use of anticoagulant therapy   7. Essential hypertension      PLAN:  In order of problems listed above:  CAD patient had abnormal nuclear stress test in January 2017 that was considered to be low risk with small partially reversible apical defect. Dr. Shirlee Latch reviewed this and felt that the patient was having exertional chest pain could do a cardiac catheterization otherwise continue medical therapy. Patient has not had any chest pain that is not very active. He says he can walk 100 yards with the walker before he has to stop because of hip pain. Continue medical therapy.   Preoperative cardiovascular exam patient is having terrible hip pain and needs hip replacement. He he has the above abnormal nuclear stress test back in January but denies chest pain. He also has a history of a LV aneurysm that was not present on 2-D echo in January 2017. Prior history of stroke as well and is on Coumadin. Most likely would need Lovenox/Coumadin bridge for surgery. We'll discuss this patient in detail with Dr. Shirlee Latch who can review and make any further recommendations prior to surgery.  Aneurysm of the heart was not present on most recent 2-D echo January 2017  Dyspnea on exertion has been chronic and felt partly due to sarcoidosis  Sarcoidosis with chronic dyspnea on exertion  Long-term Coumadin use would most likely need Lovenox Coumadin bridge if he underwent hip replacement. Will discuss with Dr. Shirlee Latch.  Hypertension blood pressure is elevated today partly due to hip pain. Will increase lisinopril to 40 mg daily. Can take an extra Lasix when necessary for leg edema. Check renal function in 2 weeks.        Medication Adjustments/Labs and Tests Ordered: Current medicines are reviewed at length with the patient today.  Concerns regarding medicines are outlined above.  Medication changes, Labs and Tests ordered today are listed in the Patient Instructions below. Patient Instructions  Medication Instructions:   START TAKING LISINOPRIL 40 MG ONCE A DAY   YOU MAY TAKE EXTRA A LASIX AS NEEDED FOR EDEMA  If you need a refill on your cardiac medications before your next appointment, please call your pharmacy.  Labwork: NONE ORDER TODAY    Testing/Procedures: NONE ORDER TODAY    Follow-Up: DR [facility] NEXT  AVAILABLE    Any Other Special Instructions Will Be Listed Below (If Applicable).                                                                                                                                                      Elson Clan, PA-C  07/01/2016 3:27 PM    [facility] [address] [place], [place], [place]  [address] Phone: [phone]; Fax: [phone]

## 2016-07-01 NOTE — Patient Instructions (Signed)
Medication Instructions:   START TAKING LISINOPRIL 40 MG ONCE A DAY   YOU MAY TAKE EXTRA A LASIX AS NEEDED FOR EDEMA  If you need a refill on your cardiac medications before your next appointment, please call your pharmacy.  Labwork: NONE ORDER TODAY    Testing/Procedures: NONE ORDER TODAY    Follow-Up: DR [facility] NEXT  AVAILABLE    Any Other Special Instructions Will Be Listed Below (If Applicable).

## 2016-07-06 NOTE — Progress Notes (Signed)
Would use Lovenox bridge.  Would not need cath if no chest pain and low risk Cardiolite.

## 2016-07-10 MED FILL — HYDROCODON-APAP 5-325: 5-325 | 15 days supply | Qty: 60 | Fill #0

## 2016-07-10 NOTE — Progress Notes (Signed)
I took care of it. Thanks, Elon Jester

## 2016-07-12 ENCOUNTER — Telehealth: Payer: Self-pay | Admitting: Cardiology

## 2016-07-12 ENCOUNTER — Telehealth: Payer: Self-pay | Admitting: Pharmacist

## 2016-07-12 NOTE — Telephone Encounter (Signed)
Spoke with patient about surgical clearance and lovenox bridging, see office note 07/01/16

## 2016-07-12 NOTE — Progress Notes (Signed)
Attempted to contact pt, no answer

## 2016-07-12 NOTE — Telephone Encounter (Signed)
-----   Message from Jacqlyn Krauss, RN sent at 07/12/2016  1:57 PM EDT ----- Per Dr Almon Hercules will need lovenox bridging prior to ortho surgery, does not have surgery date yet.

## 2016-07-12 NOTE — Progress Notes (Signed)
Pt states he is not having any chest pain. Pt states he does not have a surgery date yet. Pt advised to call [facility] once he has surgery date so they can help with Lovenox bridging.

## 2016-07-12 NOTE — Telephone Encounter (Signed)
Attempted to reach patient.  He is also overdue for follow up.  No answer on phone and no voicemail set up.

## 2016-07-12 NOTE — Telephone Encounter (Signed)
Follow-up      The pt received a phone no message left, the pt is returning a call

## 2016-07-16 NOTE — Telephone Encounter (Signed)
Spoke with pt and he is aware to let us know when surgery will be set so we can schedule him for lovenox bridge.

## 2016-07-18 ENCOUNTER — Encounter (INDEPENDENT_AMBULATORY_CARE_PROVIDER_SITE_OTHER): Payer: Self-pay

## 2016-07-18 ENCOUNTER — Ambulatory Visit (INDEPENDENT_AMBULATORY_CARE_PROVIDER_SITE_OTHER): Payer: Medicaid Other | Admitting: Pharmacist

## 2016-07-18 DIAGNOSIS — I253 Aneurysm of heart: Secondary | ICD-10-CM | POA: Diagnosis not present

## 2016-07-18 DIAGNOSIS — Z7902 Long term (current) use of antithrombotics/antiplatelets: Secondary | ICD-10-CM | POA: Diagnosis not present

## 2016-07-18 DIAGNOSIS — D869 Sarcoidosis, unspecified: Secondary | ICD-10-CM

## 2016-07-18 DIAGNOSIS — Z7901 Long term (current) use of anticoagulants: Secondary | ICD-10-CM

## 2016-07-18 LAB — POCT INR: INR: 2.1

## 2016-07-18 MED ORDER — WARFARIN SODIUM 10 MG PO TABS: ORAL_TABLET | ORAL | 0 refills | Status: DC

## 2016-08-07 MED FILL — WARFARIN NA 10 MG TAB: 10 | 30 days supply | Qty: 30 | Fill #1

## 2016-08-09 ENCOUNTER — Other Ambulatory Visit: Payer: Self-pay | Admitting: *Deleted

## 2016-08-09 NOTE — Telephone Encounter (Signed)
Called pt because he is overdue for an appt & the pharmacy was requesting a refill. Pt states he has enough Coumadin to last unitl he can schedule an appt & he would call next week to let us know. Also, pt states he has no date set for surgery at this time.   [facility] regarding[facility] he pt being overdue & that we cannot refill at this time until pt is seen to have INR monitored.  Also, they have faxed over a letter of notification to inform us that the manufacturer was being changed from [employer] to [employer]. Oris DroneAdvised Brian at [facility] that will have Dr. Shirlee LatchMclean sign & fax back form when he is in the office & he verbalized understanding.

## 2016-08-28 ENCOUNTER — Telehealth: Payer: Self-pay | Admitting: Cardiology

## 2016-08-28 NOTE — Telephone Encounter (Signed)
Patient calling to see if a clearance has sent by his dentist. Informed patient that at this time nothing as been sent. Informed patient to contact his dentist and have them call or fax a request. Patient verbalized understanding.

## 2016-08-28 NOTE — Telephone Encounter (Signed)
New message   Pt calling to get confirmation from the nurse for a fax that is suppose to be sent from the oral surgeon. Please call.

## 2016-08-28 NOTE — Telephone Encounter (Signed)
New message     Pt verbalized that he is returning rn call about procedure and to ask questions

## 2016-08-29 NOTE — Telephone Encounter (Signed)
Pt advised clearance form from oral surgeon here for Dr Shirlee LatchMcLean to review today.

## 2016-08-29 NOTE — Telephone Encounter (Signed)
Dr Shirlee LatchMcLean [facility]:  Treatment may proceed -needs lovenox bridge if has to stop warfarin.   The completed form has been returned to the nurses' fax to be faxed to [facility] and also a copy has been given to [facility] for their information.    I was unable to reach pt-I did not get an answer or voicemail at the phone number listed.

## 2016-08-30 ENCOUNTER — Telehealth: Payer: Self-pay | Admitting: Pharmacist Clinician (PhC)/ Clinical Pharmacy Specialist

## 2016-08-30 NOTE — Telephone Encounter (Signed)
Pt calling back to confirm his clearance  Please give him a call back.

## 2016-08-30 NOTE — Telephone Encounter (Signed)
Spoke with patient, at this time he does not have a date set for oral surgery.  Explained need for lovenox bridge and that he will need to call us as soon as that date is set.    Patient confused about need for lovenox, hesitant about giving himself an injection.  He did agree to call once a date has been set.

## 2016-08-30 NOTE — Telephone Encounter (Signed)
I spoke with the pt and made him aware of [facility] from [facility] and Dr Shirlee LatchMcLean.  The pt requested that I make sure that the fax has been sent.  I went to medical records and the form has not been sent at this time.  I will fax form to [facility] at [phone].

## 2016-09-02 ENCOUNTER — Telehealth: Payer: Self-pay | Admitting: Cardiology

## 2016-09-02 NOTE — Telephone Encounter (Signed)
Called [facility] to clarify how many teeth pt is having extracted. He is having 3 extractions. Asked if they are comfortable with him staying on Coumadin for the extractions. They are fine with this. They prefer INR < 3 which should not be a problem since INR range is 2-3. They would like an INR check they day before or day of dental extractions. Date is not set yet. Pt is also overdue for Coumadin check. Oral surgery office will call back once they schedule pt so that we can coordinate Coumadin check with him. Clearance has already been faxed and receipt confirmed.

## 2016-09-02 NOTE — Telephone Encounter (Signed)
Tony MilletMegan spoke with pt he is aware to continue his coumadin for dental extractions on 09/12/16. The oral surgeon only wants INR less than 3.0. He will come in  On 10/4 for INR and dose adjustment if need be.

## 2016-09-02 NOTE — Telephone Encounter (Signed)
New Message  Pt call requesting to speak with RN about his coumadin. Please call back to discuss

## 2016-09-03 MED FILL — IBUPROFEN 800 MG TABLET: 800 | 30 days supply | Qty: 90 | Fill #2

## 2016-09-03 MED FILL — WARFARIN NA 10 MG TAB: 10 | 30 days supply | Qty: 30 | Fill #0

## 2016-09-11 ENCOUNTER — Ambulatory Visit (INDEPENDENT_AMBULATORY_CARE_PROVIDER_SITE_OTHER): Payer: Medicaid Other | Admitting: Pharmacist

## 2016-09-11 DIAGNOSIS — Z7902 Long term (current) use of antithrombotics/antiplatelets: Secondary | ICD-10-CM

## 2016-09-11 DIAGNOSIS — Z7901 Long term (current) use of anticoagulants: Secondary | ICD-10-CM

## 2016-09-11 DIAGNOSIS — I253 Aneurysm of heart: Secondary | ICD-10-CM

## 2016-09-11 DIAGNOSIS — D869 Sarcoidosis, unspecified: Secondary | ICD-10-CM | POA: Diagnosis not present

## 2016-09-11 LAB — POCT INR: INR: 1.3

## 2016-09-11 MED ORDER — WARFARIN SODIUM 10 MG PO TABS: ORAL_TABLET | ORAL | 0 refills | Status: DC

## 2016-09-11 MED FILL — CHLORHEXIDINE 0.12% RINSE: 0.12 | 16 days supply | Qty: 473 | Fill #0

## 2016-09-20 ENCOUNTER — Encounter (INDEPENDENT_AMBULATORY_CARE_PROVIDER_SITE_OTHER): Payer: Self-pay

## 2016-09-20 ENCOUNTER — Encounter: Payer: Self-pay | Admitting: Cardiology

## 2016-09-20 ENCOUNTER — Ambulatory Visit (INDEPENDENT_AMBULATORY_CARE_PROVIDER_SITE_OTHER): Payer: Medicaid Other | Admitting: Cardiology

## 2016-09-20 VITALS — BP 130/84 | HR 88 | Ht 76.0 in | Wt 313.0 lb

## 2016-09-20 DIAGNOSIS — I10 Essential (primary) hypertension: Secondary | ICD-10-CM

## 2016-09-20 DIAGNOSIS — I253 Aneurysm of heart: Secondary | ICD-10-CM | POA: Diagnosis not present

## 2016-09-20 DIAGNOSIS — R0609 Other forms of dyspnea: Secondary | ICD-10-CM | POA: Diagnosis not present

## 2016-09-20 DIAGNOSIS — D869 Sarcoidosis, unspecified: Secondary | ICD-10-CM | POA: Diagnosis not present

## 2016-09-20 NOTE — Patient Instructions (Signed)
Medication Instructions:  The current medical regimen is effective;  continue present plan and medications.  Follow-Up: Follow up in 6months with Dr. Shirlee LatchMcLean.  You will receive a letter in the mail 2 months before you are due.  Please call us when you receive this letter to schedule your follow up appointment.  If you need a refill on your cardiac medications before your next appointment, please call your pharmacy.  Thank you for choosing [facility]!!

## 2016-09-22 NOTE — Progress Notes (Signed)
Patient ID: EIN RIJO, male   DOB: January 22, 1961, 55 y.o.   MRN: [id number] PCP: Dr. 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  His main complaint in the past had been dyspnea.  He did not appear volume overloaded on exam and Dr. Shelle Iron did not think that his sarcoid was responsible for his exertional dyspnea (not much parenchymal disease on CT).  Most recent echo in 1/17 showed EF 55%, unable to visualize apical aneurysm. He was evaluated by pulmonary last in 12/15.  CT chest at that time showed hilar and mediastinal adenopathy.  Cardiolite in 1/17 showed small, partially reversible apical defect, EF 53%, low risk.   Main complaint currently is hip pain on the left.  He is using a walker and plans to have left THR soon.  No dyspnea walking on flat ground with his walker.  Dyspnea and hip pain walking up stairs.  No orthopnea/PND.  No palpitations.  No snoring/daytime sleepiness.  No cough or wheeze. No chest pain. Weight is up 19 lbs, but he has not been very active because of hip pain.  Labs (4/15): K 4.1, creatinine 1.2, LDL 168 Labs (10/15): ACE level 4 (low), K 4.7, creatinine 1.5, pro-BNP 12 Labs (2/17): LDL 115, HDL 43  ECG: NSR, nonspecific T wave flattening  PMH:  1. Sarcoidosis: Pulmonary and cutaneous.  CT chest in 4/13 showed hilar and mediastinal lymphadenopathy but no significant parenchymal involvement.  PFTs (4/13) showed mild restriction.  Patient follows with Dr. Shelle Iron. Suspect cardiac involvement with non-CAD-related LV aneurysm.  PFTs (11/15): FVC 58%, FEV1 59%, ration 101%, TLC 57%, DLCO 53% (moderate-severe obstruction, severe parenchymal restriction).  CT chest (12/15) with bilateral hilar and mediastinal lymphadenopathy, pleural-based nodules.  2. HTN 3. OA left hip with severe pain.  4. H/o CVA 5. Contrast allergy 6. Cardiomyopathy: Admitted in 4/13 with CP.  Myoview showed EF 47% with  anterior ischemia. Echo showed EF 65%, apex poorly visualized.  LHC (4/13) showed no significant coronary disease but there was a small LV apical aneurysm. LV aneurysms have been seen with cardiac sarcoidosis, and I suspect this is the cause.  We planned an MRI to evaluate but patient was too claustrophobic and could not do the study.  Lexiscan Cardiolite (7/14) with EF 47%, no ischemia or infarction.  Echo (7/14) with EF 55-60%, mild MR, PA systolic pressure 33 mmHg, normal RV size and systolic function, LV apical aneurysm was not visualized on this echo.  Echo (10/15) with EF 55-60%, mild LVH, LV apical aneurysm was not visualized.  - Echo (1/17) with EF 55%, unable to visualize apical aneurysm. - Cardiolite (1/17) with small, partially reversible apical defect, EF 53%, low risk.   FH: Sister with multiple sclerosis.   SH: Married, nonsmoker, unemployed (used to work in [employer]).   ROS: All systems reviewed and negative except as per HPI.   Current Outpatient Prescriptions  Medication Sig Dispense Refill  . atorvastatin (LIPITOR) 40 MG tablet Take 1 tablet (40 mg total) by mouth daily. 30 tablet 11  . carvedilol (COREG) 12.5 MG tablet TAKE ONE-HALF TABLET BY MOUTH TWICE DAILY WITH  A  MEAL 30 tablet 11  . furosemide (LASIX) 20 MG tablet Take 1 tablet (20 mg total) by mouth daily. 30 tablet 11  . hydrochlorothiazide (HYDRODIURIL) 25 MG tablet Take 1 tablet (25 mg total) by mouth daily. 30 tablet 11  . lisinopril (PRINIVIL,ZESTRIL) 40 MG  tablet Take 1 tablet (40 mg total) by mouth daily. 90 tablet 1  . OXYCONTIN 20 MG 12 hr tablet Take 1 tablet by mouth every 12 (twelve) hours as needed. Pain  0  . potassium chloride (K-DUR,KLOR-CON) 10 MEQ tablet Take 1 tablet (10 mEq total) by mouth daily. 30 tablet 11  . traMADol (ULTRAM) 50 MG tablet Take 50 mg by mouth 2 (two) times daily.    Marland Kitchen. warfarin (COUMADIN) 10 MG tablet Take as directed by the coumadin clinic 30 tablet 0   No current  facility-administered medications for this visit.     BP 130/84   Pulse 88   Ht 6\' 4"  (1.93 m)   Wt (!) 313 lb (142 kg)   SpO2 95%   BMI 38.10 kg/m  General: NAD, obese.  Neck: JVP 7 cm, no thyromegaly or thyroid nodule.  Lungs: Clear to auscultation bilaterally with normal respiratory effort. CV: Nondisplaced PMI.  Heart regular S1/S2, no S3/S4, no murmur.  No edema.  No carotid bruit.  Normal pedal pulses.  Abdomen: Soft, nontender, no hepatosplenomegaly, no distention.  Neurologic: Alert/oriented x 3 Psych: Normal affect. Extremities: No clubbing or cyanosis.   Assessment/Plan: 1. Cardiomyopathy: Patient has been noted in the past to have a small LV apical aneurysm.  He was unable to do a cardiac MRI due to claustrophobia.  He had no coronary disease on angiography in the past.  I suspect that the aneurysm was caused by cardiac sarcoidosis.  LV aneurysm formation has been described with cardiac sarcoidosis.  He had a CVA in the past so is on chronic coumadin.  Most recent echo in 1/17 did not visualize the LV apical aneurysm.  2. Chest pain: Atypical chest pain in the past.  He had coronary angiography in 2013 without significant CAD.  Last Cardiolite in 1/17 was low risk.  3. Hyperlipidemia: He is now on atorvastatin. Good lipids in 2/17.  4. Sarcoidosis: There was evidence for sarcoidosis on 12/15 CT.  He used to see Dr Shelle Ironlance who has now retired.  He needs pulmonary followup, I will arrange.  5. Pre-operative workup: He should be of acceptable risk to undergo left THR.  Given history of possible cardioembolic stroke, he should have Lovenox bridge off warfarin.   Followup 6 months with Tereso NewcomerScott Weaver.   Marca AnconaDalton Keesha Pellum 09/22/2016

## 2016-10-08 ENCOUNTER — Telehealth: Payer: Self-pay | Admitting: Pharmacist Clinician (PhC)/ Clinical Pharmacy Specialist

## 2016-10-08 NOTE — Telephone Encounter (Signed)
Patient with history of possible embolic stroke, needing surgery for left THR.  He will need to have enoxaparin bridging in order to stop his warfarin for this procedure.  Need for bridge noted on cardiac clearance form to go back to Orthopedic MD.   Sherron MondaySpoke with patient, he is aware and will let coumadin clinic know as soon as a date is set for surgery.

## 2016-10-16 NOTE — Progress Notes (Signed)
Need orders in epic for 11-30 surgery

## 2016-10-17 ENCOUNTER — Ambulatory Visit: Payer: Self-pay | Admitting: Orthopedic Surgery

## 2016-10-17 ENCOUNTER — Telehealth: Payer: Self-pay | Admitting: Cardiology

## 2016-10-17 MED FILL — OxyCONTIN 20 MG T12A: 20 | 30 days supply | Qty: 60 | Fill #0

## 2016-10-17 MED FILL — WARFARIN NA 10 MG TAB: 10 | 30 days supply | Qty: 30 | Fill #0

## 2016-10-17 NOTE — Telephone Encounter (Addendum)
Pt calling regarding medications needing to be "recertified" with Medicaid-then called in with refills to [facility] [address].  Meds: Atorvastatin 40 mg            Carvedilol 12.5 mg             Furosimide 20mg              HCTZ 25MG              Lisinopril 40mg              Potassium Chloride 10MEQ             WAFARIN 10MG    Pt requesting call when done so he knows when to pick up RX's  [phone]

## 2016-10-18 MED ORDER — WARFARIN SODIUM 10 MG PO TABS: ORAL_TABLET | ORAL | 0 refills | Status: DC

## 2016-10-23 ENCOUNTER — Ambulatory Visit (INDEPENDENT_AMBULATORY_CARE_PROVIDER_SITE_OTHER): Payer: Medicaid Other | Admitting: *Deleted

## 2016-10-23 DIAGNOSIS — Z7901 Long term (current) use of anticoagulants: Secondary | ICD-10-CM

## 2016-10-23 DIAGNOSIS — I253 Aneurysm of heart: Secondary | ICD-10-CM

## 2016-10-23 DIAGNOSIS — D869 Sarcoidosis, unspecified: Secondary | ICD-10-CM

## 2016-10-23 DIAGNOSIS — Z7902 Long term (current) use of antithrombotics/antiplatelets: Secondary | ICD-10-CM | POA: Diagnosis not present

## 2016-10-23 LAB — POCT INR: INR: 3.8

## 2016-10-29 ENCOUNTER — Ambulatory Visit (INDEPENDENT_AMBULATORY_CARE_PROVIDER_SITE_OTHER): Payer: Medicaid Other | Admitting: *Deleted

## 2016-10-29 ENCOUNTER — Encounter (HOSPITAL_COMMUNITY)
Admission: RE | Admit: 2016-10-29 | Discharge: 2016-10-29 | Disposition: A | Discharge status: Home / Self Care | Payer: Medicaid Other | Source: Ambulatory Visit | Attending: Orthopedic Surgery | Admitting: Orthopedic Surgery

## 2016-10-29 ENCOUNTER — Encounter (HOSPITAL_COMMUNITY): Payer: Self-pay

## 2016-10-29 DIAGNOSIS — D869 Sarcoidosis, unspecified: Secondary | ICD-10-CM

## 2016-10-29 DIAGNOSIS — Z7901 Long term (current) use of anticoagulants: Secondary | ICD-10-CM | POA: Diagnosis not present

## 2016-10-29 DIAGNOSIS — Z7902 Long term (current) use of antithrombotics/antiplatelets: Secondary | ICD-10-CM

## 2016-10-29 DIAGNOSIS — M1612 Unilateral primary osteoarthritis, left hip: Secondary | ICD-10-CM | POA: Diagnosis not present

## 2016-10-29 DIAGNOSIS — Z01812 Encounter for preprocedural laboratory examination: Secondary | ICD-10-CM | POA: Insufficient documentation

## 2016-10-29 DIAGNOSIS — I253 Aneurysm of heart: Secondary | ICD-10-CM | POA: Diagnosis not present

## 2016-10-29 HISTORY — DX: Personal history of urinary calculi: Z87.442

## 2016-10-29 HISTORY — DX: Anesthesia of skin: R20.0

## 2016-10-29 HISTORY — DX: Family history of other specified conditions: Z84.89

## 2016-10-29 HISTORY — DX: Dysphagia, unspecified: R13.10

## 2016-10-29 HISTORY — DX: Aneurysm of heart: I25.3

## 2016-10-29 LAB — SURGICAL PCR SCREEN
MRSA, PCR: NEGATIVE
STAPHYLOCOCCUS AUREUS: NEGATIVE

## 2016-10-29 LAB — CBC
HEMATOCRIT: 46.6 % (ref 39.0–52.0)
Hemoglobin: 16.2 g/dL (ref 13.0–17.0)
MCH: 31 pg (ref 26.0–34.0)
MCHC: 34.8 g/dL (ref 30.0–36.0)
MCV: 89.3 fL (ref 78.0–100.0)
PLATELETS: 163 10*3/uL (ref 150–400)
RBC: 5.22 MIL/uL (ref 4.22–5.81)
RDW: 12.7 % (ref 11.5–15.5)
WBC: 5.6 10*3/uL (ref 4.0–10.5)

## 2016-10-29 LAB — POCT INR: INR: 1.9

## 2016-10-29 LAB — BASIC METABOLIC PANEL
Anion gap: 6 (ref 5–15)
BUN: 23 mg/dL — AB (ref 6–20)
CO2: 27 mmol/L (ref 22–32)
CREATININE: 1.1 mg/dL (ref 0.61–1.24)
Calcium: 10.1 mg/dL (ref 8.9–10.3)
Chloride: 109 mmol/L (ref 101–111)
GFR calc Af Amer: >60 mL/min (ref >60)
GLUCOSE: 89 mg/dL (ref 65–99)
POTASSIUM: 4.9 mmol/L (ref 3.5–5.1)
Sodium: 142 mmol/L (ref 135–145)

## 2016-10-29 LAB — ABO/RH: ABO/RH(D): O POS

## 2016-10-29 MED ORDER — LOVENOX 150 MG/ML ~~LOC~~ SOLN: 150.0000 mg | Freq: Two times a day (BID) | SUBCUTANEOUS | 1 refills | Status: DC

## 2016-10-29 MED ORDER — ENOXAPARIN SODIUM 150 MG/ML ~~LOC~~ SOLN: 150.0000 mg | Freq: Two times a day (BID) | SUBCUTANEOUS | 1 refills | Status: DC

## 2016-10-29 MED FILL — LOVENOX 150 MG PREFILLED SY: 150 | 5 days supply | Qty: 10 | Fill #0

## 2016-10-29 NOTE — Patient Instructions (Addendum)
11/01/16: Last dose of Coumadin.  11/02/16: No Coumadin or Lovenox.  11/03/16: Inject Lovenox 150mg  in the fatty abdominal tissue at least 2 inches from the belly button twice a day about 12 hours apart, 6am and 6pm rotate sites. No Coumadin.  11/04/16: Inject Lovenox 150mg   in the fatty tissue every 12 hours, 6am and 6pm. No Coumadin.  11/05/16: Inject Lovenox 150mg  in the fatty tissue every 12 hours, 6am and 6pm. No Coumadin.  11/06/16: Inject Lovenox 150mg  in the fatty tissue in the morning at 6 am (No PM dose). No Coumadin.  11/07/16: Procedure Day - No Lovenox - Resume Coumadin in the evening or as directed by doctor   Call [facility] [phone] once discharged

## 2016-10-29 NOTE — Patient Instructions (Signed)
Lavaughn Stormy FabianM Rago  10/29/2016   Your procedure is scheduled on: Thursday November 07, 2016  [facility]  Entrance take [place]  elevators to 3rd floor to  Short Stay Center at 1:45 PM.  Call this number if you have problems the morning of surgery [phone]   Remember: ONLY 1 PERSON MAY GO WITH YOU TO SHORT STAY TO GET  READY MORNING OF YOUR SURGERY.  Do not eat food After Midnight but may take clear liquids till 9:45 am day of surgery then nothing by mouth.      Take these medicines the morning of surgery with A SIP OF WATER: Carvedilol (Coreg); Oxycontin if needed                                You may not have any metal on your body including hair pins and              piercings  Do not wear jewelry, lotions, powders or colognes, deodorant                       Men may shave face and neck.   Do not bring valuables to the hospital. [place] IS NOT             RESPONSIBLE   FOR VALUABLES.  Contacts, dentures or bridgework may not be worn into surgery.  Leave suitcase in the car. After surgery it may be brought to your room.              Please read over the following fact sheets you were given:MRSA INFORMATION SHEET; INCENTIVE SPIROMETER; BLOOD TRANSFUSION INFORMATION SHEET  _____________________________________________________________________             [facility] - Preparing for [facility] Before surgery, you can play an important role.  Because skin is not sterile, your skin needs to be as free of germs as possible.  You can reduce the number of germs on your skin by washing with CHG (chlorahexidine gluconate) soap before surgery.  CHG is an antiseptic cleaner which kills germs and bonds with the skin to continue killing germs even after washing. Please DO NOT use if you have an allergy to CHG or antibacterial soaps.  If your skin becomes reddened/irritated stop using the CHG and inform your nurse when you arrive at Short Stay. Do not shave  (including legs and underarms) for at least 48 hours prior to the first CHG shower.  You may shave your face/neck. Please follow these instructions carefully:  1.  Shower with CHG Soap the night before surgery and the  morning of Surgery.  2.  If you choose to wash your hair, wash your hair first as usual with your  normal  shampoo.  3.  After you shampoo, rinse your hair and body thoroughly to remove the  shampoo.                           4.  Use CHG as you would any other liquid soap.  You can apply chg directly  to the skin and wash                       Gently with a scrungie or clean washcloth.  5.  Apply the CHG Soap to your body ONLY FROM THE NECK DOWN.   Do not use on face/ open                           Wound or open sores. Avoid contact with eyes, ears mouth and genitals (private parts).                       Wash face,  Genitals (private parts) with your normal soap.             6.  Wash thoroughly, paying special attention to the area where your surgery  will be performed.  7.  Thoroughly rinse your body with warm water from the neck down.  8.  DO NOT shower/wash with your normal soap after using and rinsing off  the CHG Soap.                9.  Pat yourself dry with a clean towel.            10.  Wear clean pajamas.            11.  Place clean sheets on your bed the night of your first shower and do not  sleep with pets. Day of Surgery : Do not apply any lotions/deodorants the morning of surgery.  Please wear clean clothes to the hospital/surgery center.  FAILURE TO FOLLOW THESE INSTRUCTIONS MAY RESULT IN THE CANCELLATION OF YOUR SURGERY PATIENT SIGNATURE_________________________________  NURSE SIGNATURE__________________________________  ________________________________________________________________________    CLEAR LIQUID DIET   Foods Allowed                                                                     Foods Excluded  Coffee and tea, regular and decaf                              liquids that you cannot  Plain Jell-O in any flavor                                             see through such as: Fruit ices (not with fruit pulp)                                     milk, soups, orange juice  Iced Popsicles                                    All solid food Carbonated beverages, regular and diet                                    Cranberry, grape and apple juices Sports drinks like Gatorade Lightly seasoned clear broth or consume(fat free) Sugar, honey syrup  Sample Menu Breakfast  Lunch                                     Supper Cranberry juice                    Beef broth                            Chicken broth Jell-O                                     Grape juice                           Apple juice Coffee or tea                        Jell-O                                      Popsicle                                                Coffee or tea                        Coffee or tea  _____________________________________________________________________    Incentive Spirometer  An incentive spirometer is a tool that can help keep your lungs clear and active. This tool measures how well you are filling your lungs with each breath. Taking long deep breaths may help reverse or decrease the chance of developing breathing (pulmonary) problems (especially infection) following:  A long period of time when you are unable to move or be active. BEFORE THE PROCEDURE   If the spirometer includes an indicator to show your best effort, your nurse or respiratory therapist will set it to a desired goal.  If possible, sit up straight or lean slightly forward. Try not to slouch.  Hold the incentive spirometer in an upright position. INSTRUCTIONS FOR USE  1. Sit on the edge of your bed if possible, or sit up as far as you can in bed or on a chair. 2. Hold the incentive spirometer in an upright position. 3. Breathe out  normally. 4. Place the mouthpiece in your mouth and seal your lips tightly around it. 5. Breathe in slowly and as deeply as possible, raising the piston or the ball toward the top of the column. 6. Hold your breath for 3-5 seconds or for as long as possible. Allow the piston or ball to fall to the bottom of the column. 7. Remove the mouthpiece from your mouth and breathe out normally. 8. Rest for a few seconds and repeat Steps 1 through 7 at least 10 times every 1-2 hours when you are awake. Take your time and take a few normal breaths between deep breaths. 9. The spirometer may include an indicator to show your best effort. Use the indicator as a goal to work toward during each repetition. 10. After each set of 10 deep breaths,  practice coughing to be sure your lungs are clear. If you have an incision (the cut made at the time of surgery), support your incision when coughing by placing a pillow or rolled up towels firmly against it. Once you are able to get out of bed, walk around indoors and cough well. You may stop using the incentive spirometer when instructed by your caregiver.  RISKS AND COMPLICATIONS  Take your time so you do not get dizzy or light-headed.  If you are in pain, you may need to take or ask for pain medication before doing incentive spirometry. It is harder to take a deep breath if you are having pain. AFTER USE  Rest and breathe slowly and easily.  It can be helpful to keep track of a log of your progress. Your caregiver can provide you with a simple table to help with this. If you are using the spirometer at home, follow these instructions: SEEK MEDICAL CARE IF:   You are having difficultly using the spirometer.  You have trouble using the spirometer as often as instructed.  Your pain medication is not giving enough relief while using the spirometer.  You develop fever of 100.5 F (38.1 C) or higher. SEEK IMMEDIATE MEDICAL CARE IF:   You cough up bloody sputum  that had not been present before.  You develop fever of 102 F (38.9 C) or greater.  You develop worsening pain at or near the incision site. MAKE SURE YOU:   Understand these instructions.  Will watch your condition.  Will get help right away if you are not doing well or get worse. Document Released: 04/07/2007 Document Revised: 02/17/2012 Document Reviewed: 06/08/2007 ExitCare Patient Information 2014 [employer], [place].   ________________________________________________________________________  WHAT IS A BLOOD TRANSFUSION? Blood Transfusion Information  A transfusion is the replacement of blood or some of its parts. Blood is made up of multiple cells which provide different functions.  Red blood cells carry oxygen and are used for blood loss replacement.  White blood cells fight against infection.  Platelets control bleeding.  Plasma helps clot blood.  Other blood products are available for specialized needs, such as hemophilia or other clotting disorders. BEFORE THE TRANSFUSION  Who gives blood for transfusions?   Healthy volunteers who are fully evaluated to make sure their blood is safe. This is blood bank blood. Transfusion therapy is the safest it has ever been in the practice of medicine. Before blood is taken from a donor, a complete history is taken to make sure that person has no history of diseases nor engages in risky social behavior (examples are intravenous drug use or sexual activity with multiple partners). The donor's travel history is screened to minimize risk of transmitting infections, such as malaria. The donated blood is tested for signs of infectious diseases, such as HIV and hepatitis. The blood is then tested to be sure it is compatible with you in order to minimize the chance of a transfusion reaction. If you or a relative donates blood, this is often done in anticipation of surgery and is not appropriate for emergency situations. It takes many days to  process the donated blood. RISKS AND COMPLICATIONS Although transfusion therapy is very safe and saves many lives, the main dangers of transfusion include:   Getting an infectious disease.  Developing a transfusion reaction. This is an allergic reaction to something in the blood you were given. Every precaution is taken to prevent this. The decision to have a blood transfusion has been  considered carefully by your caregiver before blood is given. Blood is not given unless the benefits outweigh the risks. AFTER THE TRANSFUSION  Right after receiving a blood transfusion, you will usually feel much better and more energetic. This is especially true if your red blood cells have gotten low (anemic). The transfusion raises the level of the red blood cells which carry oxygen, and this usually causes an energy increase.  The nurse administering the transfusion will monitor you carefully for complications. HOME CARE INSTRUCTIONS  No special instructions are needed after a transfusion. You may find your energy is better. Speak with your caregiver about any limitations on activity for underlying diseases you may have. SEEK MEDICAL CARE IF:   Your condition is not improving after your transfusion.  You develop redness or irritation at the intravenous (IV) site. SEEK IMMEDIATE MEDICAL CARE IF:  Any of the following symptoms occur over the next 12 hours:  Shaking chills.  You have a temperature by mouth above 102 F (38.9 C), not controlled by medicine.  Chest, back, or muscle pain.  People around you feel you are not acting correctly or are confused.  Shortness of breath or difficulty breathing.  Dizziness and fainting.  You get a rash or develop hives.  You have a decrease in urine output.  Your urine turns a dark color or changes to pink, red, or brown. Any of the following symptoms occur over the next 10 days:  You have a temperature by mouth above 102 F (38.9 C), not controlled by  medicine.  Shortness of breath.  Weakness after normal activity.  The white part of the eye turns yellow (jaundice).  You have a decrease in the amount of urine or are urinating less often.  Your urine turns a dark color or changes to pink, red, or brown. Document Released: 11/22/2000 Document Revised: 02/17/2012 Document Reviewed: 07/11/2008 [facility] Patient Information 2014 [place], [place].  _______________________________________________________________________

## 2016-10-29 NOTE — Progress Notes (Signed)
Clearance note per chart per Dr Pecola Leisureeese 09/26/2016; Dr Neva SeatGreene 10/03/2016; Dr Shirlee LatchMcLean

## 2016-10-30 ENCOUNTER — Encounter (HOSPITAL_COMMUNITY): Payer: Self-pay

## 2016-10-30 NOTE — Progress Notes (Signed)
Your patient has screened at an elevated risk for Obstructive Sleep Apnea using the Stop-Bang Tool during a pre-surgical visit. Patient scored at high risk.  

## 2016-11-02 ENCOUNTER — Ambulatory Visit: Payer: Self-pay | Admitting: Orthopedic Surgery

## 2016-11-02 NOTE — H&P (Signed)
TOTAL HIP ADMISSION H&P  Patient is admitted for left total hip arthroplasty.  Subjective:  Chief Complaint: left hip pain  HPI: Tony Howard, 55 y.o. male, has a history of pain and functional disability in the left hip(s) due to arthritis and patient has failed non-surgical conservative treatments for greater than 12 weeks to include NSAID's and/or analgesics, flexibility and strengthening excercises, use of assistive devices, weight reduction as appropriate and activity modification.  Onset of symptoms was gradual starting 6 years ago with rapidlly worsening course since that time.The patient noted no past surgery on the left hip(s).  Patient currently rates pain in the left hip at 10 out of 10 with activity. Patient has night pain, worsening of pain with activity and weight bearing, pain that interfers with activities of daily living, pain with passive range of motion and crepitus. Patient has evidence of subchondral cysts, subchondral sclerosis, periarticular osteophytes, joint subluxation and joint space narrowing by imaging studies. This condition presents safety issues increasing the risk of falls. This patient has had avascular necrosis of the hip, acetabular fracture, hip dysplasia.  There is no current active infection.  Patient Active Problem List   Diagnosis Date Noted  . Chest pain 11/28/2015  . Encounter for long-term (current) use of antiplatelets/antithrombotics 03/03/2014  . Edema 04/23/2012  . Bradycardia 04/15/2012  . 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 Long term (current) use of anticoagulants 03/18/2012  . Chest pain on exertion 03/10/2012  . DOE (dyspnea on exertion) 03/10/2012  . Sarcoidosis (HCC) 03/10/2012  . HTN (hypertension) 03/10/2012  . Noncompliance 03/10/2012   Past Medical History:  Diagnosis Date  . Aneurysm of heart    LHC 03/13/12:  Normal cors, EF 60-65% with small but definite apical aneurysm.  He was started on coumadin as thrombus formation was a high  probability  . Cardiac aneurysm   . Chronic pain   . Cutaneous sarcoidosis (HCC)   . CVA (cerebral infarction)    Head CT showing encephalomalacia c/w old infarct in the distribution of the right PCA in April 2013  . DJD (degenerative joint disease)   . Family history of adverse reaction to anesthesia    pts mother and pts sister had difficulty with breathing   . History of kidney stones   . History of stroke   . Hypertension    echo 4/13:  mild LVH, EF 65-70%, grade 1 diast dysfxn, mild LAE  . Left sided numbness   . Osteoarthritis   . Sarcoidosis of lung (HCC)    Myoview 3/13 with EF 47%; anterior ischemia;  LHC without CAD;  MRI recommended to r/o cardiac sarcoid - but patient claustrophobic and MRI not done  . Stroke (HCC)   . Swallowing difficulty     Past Surgical History:  Procedure Laterality Date  . CYSTOSCOPY W/ URETERAL STENT PLACEMENT    . KNEE SURGERY     right / childhood   . LEFT HEART CATHETERIZATION WITH CORONARY ANGIOGRAM N/A 03/13/2012   Procedure: LEFT HEART CATHETERIZATION WITH CORONARY ANGIOGRAM;  Surgeon: Vesta MixerPhilip J Nahser, MD;  [facility];  Service: Cardiovascular;  Laterality: N/A;  Contrast allergy  . WISDOM TOOTH EXTRACTION     4     (Not in a hospital admission) Allergies  Allergen Reactions  . Naprosyn [Naproxen] Hives  . Iodine Hives and Rash    shellfish    Social History  Substance Use Topics  . Smoking status: Never Smoker  . Smokeless tobacco: Never Used  .  Alcohol use Yes     Comment: occas     Family History  Problem Relation Age of Onset  . Lung cancer Mother   . Multiple sclerosis Sister      Review of Systems  Constitutional: Negative.   HENT: Negative.   Eyes: Negative.   Respiratory: Negative.   Cardiovascular: Positive for leg swelling.  Gastrointestinal: Negative.   Genitourinary: Negative.   Musculoskeletal: Positive for back pain and joint pain.  Skin: Negative.   Neurological: Negative.    Endo/Heme/Allergies: Negative.   Psychiatric/Behavioral: Negative.     Objective:  Physical Exam  Vital signs in last 24 hours: @VSRANGES @  Labs:   Estimated body mass index is 38.1 kg/m as calculated from the following:   Height as of 09/20/16: 6\' 4"  (1.93 m).   Weight as of 09/20/16: 142 kg (313 lb).   Imaging Review Plain radiographs demonstrate severe degenerative joint disease of the left hip(s). The bone quality appears to be adequate for age and reported activity level.  Assessment/Plan:  End stage arthritis, left hip(s)  The patient history, physical examination, clinical judgement of the provider and imaging studies are consistent with end stage degenerative joint disease of the left hip(s) and total hip arthroplasty is deemed medically necessary. The treatment options including medical management, injection therapy, arthroscopy and arthroplasty were discussed at length. The risks and benefits of total hip arthroplasty were presented and reviewed. The risks due to aseptic loosening, infection, stiffness, dislocation/subluxation,  thromboembolic complications and other imponderables were discussed.  The patient acknowledged the explanation, agreed to proceed with the plan and consent was signed. Patient is being admitted for inpatient treatment for surgery, pain control, PT, OT, prophylactic antibiotics, VTE prophylaxis, progressive ambulation and ADL's and discharge planning.The patient is planning to be discharged home with home health services. Coumadin with lovenox bridge.

## 2016-11-06 MED ORDER — DEXTROSE 5 % IV SOLN
3.0000 g | INTRAVENOUS | Status: AC
  Administered 2016-11-07: 3 g via INTRAVENOUS
  Filled 2016-11-06: qty 3
  Filled 2016-11-06 (×2): qty 3000

## 2016-11-07 ENCOUNTER — Inpatient Hospital Stay (HOSPITAL_COMMUNITY): Payer: Medicaid Other | Admitting: Anesthesiology

## 2016-11-07 ENCOUNTER — Encounter (HOSPITAL_COMMUNITY)
Admission: RE | Disposition: A | Discharge status: Home Health | Payer: Self-pay | Source: Ambulatory Visit | Attending: Orthopedic Surgery

## 2016-11-07 ENCOUNTER — Inpatient Hospital Stay (HOSPITAL_COMMUNITY): Payer: Medicaid Other

## 2016-11-07 ENCOUNTER — Inpatient Hospital Stay (HOSPITAL_COMMUNITY)
Admission: RE | Admit: 2016-11-07 | Discharge: 2016-11-11 | DRG: 470 | Disposition: A | Discharge status: Home Health | Payer: Medicaid Other | Source: Ambulatory Visit | Attending: Orthopedic Surgery | Admitting: Orthopedic Surgery

## 2016-11-07 ENCOUNTER — Encounter (HOSPITAL_COMMUNITY): Payer: Self-pay | Admitting: *Deleted

## 2016-11-07 DIAGNOSIS — D860 Sarcoidosis of lung: Secondary | ICD-10-CM | POA: Diagnosis present

## 2016-11-07 DIAGNOSIS — M1612 Unilateral primary osteoarthritis, left hip: Secondary | ICD-10-CM | POA: Diagnosis present

## 2016-11-07 DIAGNOSIS — G8929 Other chronic pain: Secondary | ICD-10-CM | POA: Diagnosis present

## 2016-11-07 DIAGNOSIS — I10 Essential (primary) hypertension: Secondary | ICD-10-CM | POA: Diagnosis present

## 2016-11-07 DIAGNOSIS — M25552 Pain in left hip: Secondary | ICD-10-CM

## 2016-11-07 DIAGNOSIS — M87052 Idiopathic aseptic necrosis of left femur: Secondary | ICD-10-CM | POA: Diagnosis present

## 2016-11-07 DIAGNOSIS — D869 Sarcoidosis, unspecified: Secondary | ICD-10-CM | POA: Diagnosis present

## 2016-11-07 DIAGNOSIS — E669 Obesity, unspecified: Secondary | ICD-10-CM | POA: Diagnosis present

## 2016-11-07 DIAGNOSIS — Z09 Encounter for follow-up examination after completed treatment for conditions other than malignant neoplasm: Secondary | ICD-10-CM

## 2016-11-07 DIAGNOSIS — M879 Osteonecrosis, unspecified: Secondary | ICD-10-CM | POA: Diagnosis present

## 2016-11-07 DIAGNOSIS — Z8673 Personal history of transient ischemic attack (TIA), and cerebral infarction without residual deficits: Secondary | ICD-10-CM

## 2016-11-07 DIAGNOSIS — Z6837 Body mass index (BMI) 37.0-37.9, adult: Secondary | ICD-10-CM | POA: Diagnosis not present

## 2016-11-07 HISTORY — PX: TOTAL HIP ARTHROPLASTY: SHX124

## 2016-11-07 LAB — CBC
HCT: 40.3 % (ref 39.0–52.0)
Hemoglobin: 13.5 g/dL (ref 13.0–17.0)
MCH: 30.8 pg (ref 26.0–34.0)
MCHC: 33.5 g/dL (ref 30.0–36.0)
MCV: 92 fL (ref 78.0–100.0)
Platelets: 137 10*3/uL — ABNORMAL LOW (ref 150–400)
RBC: 4.38 MIL/uL (ref 4.22–5.81)
RDW: 12.8 % (ref 11.5–15.5)
WBC: 13.4 10*3/uL — ABNORMAL HIGH (ref 4.0–10.5)

## 2016-11-07 LAB — TYPE AND SCREEN
ABO/RH(D): O POS
ANTIBODY SCREEN: NEGATIVE

## 2016-11-07 LAB — PROTIME-INR
INR: 1.03
Prothrombin Time: 13.5 seconds (ref 11.4–15.2)

## 2016-11-07 LAB — CREATININE, SERUM
CREATININE: 1.32 mg/dL — AB (ref 0.61–1.24)
GFR calc Af Amer: >60 mL/min (ref >60)
GFR, EST NON AFRICAN AMERICAN: 59 mL/min — AB (ref >60)

## 2016-11-07 SURGERY — ARTHROPLASTY, HIP, TOTAL, ANTERIOR APPROACH
Anesthesia: Monitor Anesthesia Care | Site: Hip | Laterality: Left

## 2016-11-07 MED ORDER — BUPIVACAINE HCL (PF) 0.25 % IJ SOLN
INTRAMUSCULAR | Status: AC
  Filled 2016-11-07: qty 30

## 2016-11-07 MED ORDER — PHENOL 1.4 % MT LIQD: 1.0000 | OROMUCOSAL | Status: DC | PRN

## 2016-11-07 MED ORDER — BUPIVACAINE HCL (PF) 0.5 % IJ SOLN
INTRAMUSCULAR | Status: DC | PRN
  Administered 2016-11-07: 3 mL

## 2016-11-07 MED ORDER — ACETAMINOPHEN 10 MG/ML IV SOLN
1000.0000 mg | INTRAVENOUS | Status: AC
  Administered 2016-11-07: 1000 mg via INTRAVENOUS

## 2016-11-07 MED ORDER — ONDANSETRON HCL 4 MG/2ML IJ SOLN
INTRAMUSCULAR | Status: DC | PRN
  Administered 2016-11-07: 4 mg via INTRAVENOUS

## 2016-11-07 MED ORDER — PROPOFOL 10 MG/ML IV BOLUS
INTRAVENOUS | Status: AC
  Filled 2016-11-07: qty 20

## 2016-11-07 MED ORDER — ACETAMINOPHEN 650 MG RE SUPP: 650.0000 mg | Freq: Four times a day (QID) | RECTAL | Status: DC | PRN

## 2016-11-07 MED ORDER — MIDAZOLAM HCL 2 MG/2ML IJ SOLN
INTRAMUSCULAR | Status: AC
  Filled 2016-11-07: qty 2

## 2016-11-07 MED ORDER — SODIUM CHLORIDE 0.9 % IV SOLN: INTRAVENOUS | Status: DC

## 2016-11-07 MED ORDER — CARVEDILOL 6.25 MG PO TABS
6.2500 mg | ORAL_TABLET | Freq: Two times a day (BID) | ORAL | Status: DC
  Administered 2016-11-07 – 2016-11-11 (×8): 6.25 mg via ORAL
  Filled 2016-11-07 (×8): qty 1

## 2016-11-07 MED ORDER — DEXAMETHASONE SODIUM PHOSPHATE 10 MG/ML IJ SOLN
INTRAMUSCULAR | Status: AC
  Filled 2016-11-07: qty 1

## 2016-11-07 MED ORDER — CHLORHEXIDINE GLUCONATE 4 % EX LIQD: 60.0000 mL | Freq: Once | CUTANEOUS | Status: DC

## 2016-11-07 MED ORDER — 0.9 % SODIUM CHLORIDE (POUR BTL) OPTIME
TOPICAL | Status: DC | PRN
  Administered 2016-11-07: 1000 mL

## 2016-11-07 MED ORDER — BUPIVACAINE HCL (PF) 0.5 % IJ SOLN
INTRAMUSCULAR | Status: AC
  Filled 2016-11-07: qty 30

## 2016-11-07 MED ORDER — HYDROMORPHONE HCL 1 MG/ML IJ SOLN
0.2500 mg | INTRAMUSCULAR | Status: DC | PRN
  Administered 2016-11-07 (×2): 0.5 mg via INTRAVENOUS

## 2016-11-07 MED ORDER — PROPOFOL 10 MG/ML IV BOLUS
INTRAVENOUS | Status: AC
  Filled 2016-11-07: qty 60

## 2016-11-07 MED ORDER — ACETAMINOPHEN 325 MG PO TABS: 650.0000 mg | ORAL_TABLET | Freq: Four times a day (QID) | ORAL | Status: DC | PRN

## 2016-11-07 MED ORDER — SENNA 8.6 MG PO TABS
2.0000 | ORAL_TABLET | Freq: Every day | ORAL | Status: DC
  Administered 2016-11-09 – 2016-11-10 (×2): 17.2 mg via ORAL
  Filled 2016-11-07 (×4): qty 2

## 2016-11-07 MED ORDER — ACETAMINOPHEN 10 MG/ML IV SOLN
INTRAVENOUS | Status: AC
  Filled 2016-11-07: qty 100

## 2016-11-07 MED ORDER — TRANEXAMIC ACID 1000 MG/10ML IV SOLN
1000.0000 mg | INTRAVENOUS | Status: AC
  Administered 2016-11-07: 1000 mg via INTRAVENOUS
  Filled 2016-11-07: qty 1100

## 2016-11-07 MED ORDER — WARFARIN - PHARMACIST DOSING INPATIENT: Freq: Every day | Status: DC

## 2016-11-07 MED ORDER — POVIDONE-IODINE 10 % EX SWAB: 2.0000 "application " | Freq: Once | CUTANEOUS | Status: DC

## 2016-11-07 MED ORDER — DOCUSATE SODIUM 100 MG PO CAPS
100.0000 mg | ORAL_CAPSULE | Freq: Two times a day (BID) | ORAL | Status: DC
  Administered 2016-11-07 – 2016-11-11 (×8): 100 mg via ORAL
  Filled 2016-11-07 (×8): qty 1

## 2016-11-07 MED ORDER — PROPOFOL 500 MG/50ML IV EMUL
INTRAVENOUS | Status: DC | PRN
  Administered 2016-11-07: 85 ug/kg/min via INTRAVENOUS

## 2016-11-07 MED ORDER — WARFARIN SODIUM 5 MG PO TABS
10.0000 mg | ORAL_TABLET | Freq: Once | ORAL | Status: AC
  Administered 2016-11-07: 10 mg via ORAL
  Filled 2016-11-07: qty 2

## 2016-11-07 MED ORDER — PHENYLEPHRINE HCL 10 MG/ML IJ SOLN
INTRAMUSCULAR | Status: AC
  Filled 2016-11-07: qty 1

## 2016-11-07 MED ORDER — ENOXAPARIN SODIUM 40 MG/0.4ML ~~LOC~~ SOLN
40.0000 mg | Freq: Two times a day (BID) | SUBCUTANEOUS | Status: DC
  Administered 2016-11-08 – 2016-11-11 (×7): 40 mg via SUBCUTANEOUS
  Filled 2016-11-07 (×7): qty 0.4

## 2016-11-07 MED ORDER — HYDROCHLOROTHIAZIDE 25 MG PO TABS
25.0000 mg | ORAL_TABLET | Freq: Every day | ORAL | Status: DC
  Administered 2016-11-10 – 2016-11-11 (×2): 25 mg via ORAL
  Filled 2016-11-07 (×2): qty 1

## 2016-11-07 MED ORDER — ONDANSETRON HCL 4 MG/2ML IJ SOLN
4.0000 mg | Freq: Four times a day (QID) | INTRAMUSCULAR | Status: DC | PRN
  Administered 2016-11-08: 4 mg via INTRAVENOUS
  Filled 2016-11-07: qty 2

## 2016-11-07 MED ORDER — FUROSEMIDE 20 MG PO TABS
20.0000 mg | ORAL_TABLET | Freq: Every day | ORAL | Status: DC
  Administered 2016-11-08 – 2016-11-11 (×4): 20 mg via ORAL
  Filled 2016-11-07 (×4): qty 1

## 2016-11-07 MED ORDER — PHENYLEPHRINE HCL 10 MG/ML IJ SOLN
INTRAVENOUS | Status: DC | PRN
  Administered 2016-11-07: 20 ug/min via INTRAVENOUS

## 2016-11-07 MED ORDER — SODIUM CHLORIDE 0.9 % IJ SOLN
INTRAMUSCULAR | Status: AC
  Filled 2016-11-07: qty 50

## 2016-11-07 MED ORDER — HYDROMORPHONE HCL 1 MG/ML IJ SOLN
INTRAMUSCULAR | Status: AC
  Filled 2016-11-07: qty 1

## 2016-11-07 MED ORDER — MEPERIDINE HCL 50 MG/ML IJ SOLN: 6.2500 mg | INTRAMUSCULAR | Status: DC | PRN

## 2016-11-07 MED ORDER — LACTATED RINGERS IV SOLN
INTRAVENOUS | Status: DC | PRN
  Administered 2016-11-07 (×3): via INTRAVENOUS

## 2016-11-07 MED ORDER — BUPIVACAINE HCL (PF) 0.25 % IJ SOLN
INTRAMUSCULAR | Status: DC | PRN
  Administered 2016-11-07: 30 mL

## 2016-11-07 MED ORDER — METOCLOPRAMIDE HCL 5 MG/ML IJ SOLN: 5.0000 mg | Freq: Three times a day (TID) | INTRAMUSCULAR | Status: DC | PRN

## 2016-11-07 MED ORDER — ISOPROPYL ALCOHOL 70 % SOLN
Status: DC | PRN
  Administered 2016-11-07: 1 via TOPICAL

## 2016-11-07 MED ORDER — SODIUM CHLORIDE 0.9 % IJ SOLN
INTRAMUSCULAR | Status: DC | PRN
  Administered 2016-11-07: 30 mL

## 2016-11-07 MED ORDER — POLYETHYLENE GLYCOL 3350 17 G PO PACK
17.0000 g | PACK | Freq: Every day | ORAL | Status: DC | PRN
  Administered 2016-11-09: 17 g via ORAL
  Filled 2016-11-07: qty 1

## 2016-11-07 MED ORDER — DEXAMETHASONE SODIUM PHOSPHATE 10 MG/ML IJ SOLN
INTRAMUSCULAR | Status: DC | PRN
  Administered 2016-11-07: 10 mg via INTRAVENOUS

## 2016-11-07 MED ORDER — WATER FOR IRRIGATION, STERILE IR SOLN
Status: DC | PRN
  Administered 2016-11-07: 2000 mL

## 2016-11-07 MED ORDER — ONDANSETRON HCL 4 MG PO TABS
4.0000 mg | ORAL_TABLET | Freq: Four times a day (QID) | ORAL | Status: DC | PRN
  Administered 2016-11-10: 4 mg via ORAL
  Filled 2016-11-07: qty 1

## 2016-11-07 MED ORDER — MIDAZOLAM HCL 5 MG/5ML IJ SOLN
INTRAMUSCULAR | Status: DC | PRN
  Administered 2016-11-07: 2 mg via INTRAVENOUS

## 2016-11-07 MED ORDER — FENTANYL CITRATE (PF) 100 MCG/2ML IJ SOLN
INTRAMUSCULAR | Status: AC
  Filled 2016-11-07: qty 2

## 2016-11-07 MED ORDER — ONDANSETRON HCL 4 MG/2ML IJ SOLN
INTRAMUSCULAR | Status: AC
  Filled 2016-11-07: qty 2

## 2016-11-07 MED ORDER — OXYCODONE HCL 5 MG PO TABS
5.0000 mg | ORAL_TABLET | ORAL | Status: DC | PRN
  Administered 2016-11-07: 5 mg via ORAL
  Administered 2016-11-08 (×4): 10 mg via ORAL
  Administered 2016-11-08 (×2): 5 mg via ORAL
  Administered 2016-11-09 – 2016-11-11 (×10): 10 mg via ORAL
  Filled 2016-11-07 (×4): qty 2
  Filled 2016-11-07: qty 1
  Filled 2016-11-07 (×2): qty 2
  Filled 2016-11-07: qty 1
  Filled 2016-11-07 (×6): qty 2
  Filled 2016-11-07: qty 1
  Filled 2016-11-07 (×2): qty 2

## 2016-11-07 MED ORDER — LIDOCAINE 2% (20 MG/ML) 5 ML SYRINGE
INTRAMUSCULAR | Status: DC | PRN
  Administered 2016-11-07 (×2): 50 mg via INTRAVENOUS

## 2016-11-07 MED ORDER — CEFAZOLIN SODIUM-DEXTROSE 2-4 GM/100ML-% IV SOLN
2.0000 g | Freq: Four times a day (QID) | INTRAVENOUS | Status: AC
  Administered 2016-11-07 – 2016-11-08 (×2): 2 g via INTRAVENOUS
  Filled 2016-11-07 (×2): qty 100

## 2016-11-07 MED ORDER — KETOROLAC TROMETHAMINE 30 MG/ML IJ SOLN
INTRAMUSCULAR | Status: AC
  Filled 2016-11-07: qty 1

## 2016-11-07 MED ORDER — LISINOPRIL 20 MG PO TABS
40.0000 mg | ORAL_TABLET | Freq: Every day | ORAL | Status: DC
  Administered 2016-11-07 – 2016-11-11 (×3): 40 mg via ORAL
  Filled 2016-11-07 (×3): qty 2

## 2016-11-07 MED ORDER — KETOROLAC TROMETHAMINE 30 MG/ML IJ SOLN
INTRAMUSCULAR | Status: DC | PRN
  Administered 2016-11-07: 30 mg

## 2016-11-07 MED ORDER — METOCLOPRAMIDE HCL 5 MG PO TABS: 5.0000 mg | ORAL_TABLET | Freq: Three times a day (TID) | ORAL | Status: DC | PRN

## 2016-11-07 MED ORDER — ATORVASTATIN CALCIUM 20 MG PO TABS
40.0000 mg | ORAL_TABLET | Freq: Every day | ORAL | Status: DC
  Administered 2016-11-08 – 2016-11-11 (×4): 40 mg via ORAL
  Filled 2016-11-07 (×4): qty 2

## 2016-11-07 MED ORDER — HYDROMORPHONE HCL 1 MG/ML IJ SOLN
INTRAMUSCULAR | Status: AC
  Administered 2016-11-07: 0.5 mg via INTRAVENOUS
  Filled 2016-11-07: qty 1

## 2016-11-07 MED ORDER — FENTANYL CITRATE (PF) 100 MCG/2ML IJ SOLN
INTRAMUSCULAR | Status: DC | PRN
  Administered 2016-11-07: 25 ug via INTRAVENOUS
  Administered 2016-11-07: 50 ug via INTRAVENOUS
  Administered 2016-11-07: 25 ug via INTRAVENOUS

## 2016-11-07 MED ORDER — PHENYLEPHRINE 40 MCG/ML (10ML) SYRINGE FOR IV PUSH (FOR BLOOD PRESSURE SUPPORT)
PREFILLED_SYRINGE | INTRAVENOUS | Status: DC | PRN
  Administered 2016-11-07 (×3): 80 ug via INTRAVENOUS

## 2016-11-07 MED ORDER — PROMETHAZINE HCL 25 MG/ML IJ SOLN: 6.2500 mg | INTRAMUSCULAR | Status: DC | PRN

## 2016-11-07 MED ORDER — SODIUM CHLORIDE 0.9 % IR SOLN
Status: DC | PRN
  Administered 2016-11-07: 1000 mL

## 2016-11-07 MED ORDER — MENTHOL 3 MG MT LOZG: 1.0000 | LOZENGE | OROMUCOSAL | Status: DC | PRN

## 2016-11-07 MED ORDER — OXYCODONE HCL ER 20 MG PO T12A
20.0000 mg | EXTENDED_RELEASE_TABLET | Freq: Two times a day (BID) | ORAL | Status: DC
  Administered 2016-11-07 – 2016-11-11 (×7): 20 mg via ORAL
  Filled 2016-11-07 (×7): qty 1

## 2016-11-07 MED ORDER — SODIUM CHLORIDE 0.9 % IV SOLN
INTRAVENOUS | Status: DC
  Administered 2016-11-07: 22:00:00 via INTRAVENOUS

## 2016-11-07 MED ORDER — DEXAMETHASONE SODIUM PHOSPHATE 10 MG/ML IJ SOLN
10.0000 mg | Freq: Once | INTRAMUSCULAR | Status: AC
  Administered 2016-11-08: 10 mg via INTRAVENOUS
  Filled 2016-11-07: qty 1

## 2016-11-07 MED ORDER — HYDROMORPHONE HCL 1 MG/ML IJ SOLN
0.5000 mg | INTRAMUSCULAR | Status: DC | PRN
  Administered 2016-11-08: 0.5 mg via INTRAVENOUS
  Filled 2016-11-07: qty 0.5

## 2016-11-07 SURGICAL SUPPLY — 53 items
ADH SKN CLS APL DERMABOND .7 (GAUZE/BANDAGES/DRESSINGS) ×2
BAG DECANTER FOR FLEXI CONT (MISCELLANEOUS) IMPLANT
BAG SPEC THK2 15X12 ZIP CLS (MISCELLANEOUS)
BAG ZIPLOCK 12X15 (MISCELLANEOUS) IMPLANT
CAPT HIP TOTAL 2 ×2 IMPLANT
CHLORAPREP W/TINT 26ML (MISCELLANEOUS) ×3 IMPLANT
CLOTH BEACON ORANGE TIMEOUT ST (SAFETY) ×3 IMPLANT
COVER PERINEAL POST (MISCELLANEOUS) ×3 IMPLANT
DECANTER SPIKE VIAL GLASS SM (MISCELLANEOUS) ×3 IMPLANT
DERMABOND ADVANCED (GAUZE/BANDAGES/DRESSINGS) ×4
DERMABOND ADVANCED .7 DNX12 (GAUZE/BANDAGES/DRESSINGS) ×2 IMPLANT
DRAPE SHEET LG 3/4 BI-LAMINATE (DRAPES) ×6 IMPLANT
DRAPE STERI IOBAN 125X83 (DRAPES) ×3 IMPLANT
DRAPE U-SHAPE 47X51 STRL (DRAPES) ×6 IMPLANT
DRSG AQUACEL AG ADV 3.5X10 (GAUZE/BANDAGES/DRESSINGS) ×3 IMPLANT
ELECT PENCIL ROCKER SW 15FT (MISCELLANEOUS) ×3 IMPLANT
ELECT REM PT RETURN 15FT ADLT (MISCELLANEOUS) ×3 IMPLANT
GAUZE SPONGE 4X4 12PLY STRL (GAUZE/BANDAGES/DRESSINGS) ×3 IMPLANT
GLOVE BIO SURGEON STRL SZ8.5 (GLOVE) ×8 IMPLANT
GLOVE BIOGEL PI IND STRL 7.5 (GLOVE) IMPLANT
GLOVE BIOGEL PI IND STRL 8.5 (GLOVE) ×1 IMPLANT
GLOVE BIOGEL PI INDICATOR 7.5 (GLOVE) ×2
GLOVE BIOGEL PI INDICATOR 8.5 (GLOVE) ×2
GLOVE ORTHO TXT STRL SZ7.5 (GLOVE) ×2 IMPLANT
GOWN SPEC L3 XXLG W/TWL (GOWN DISPOSABLE) ×3 IMPLANT
GOWN STRL REUS W/ TWL LRG LVL3 (GOWN DISPOSABLE) IMPLANT
GOWN STRL REUS W/TWL LRG LVL3 (GOWN DISPOSABLE) ×3
GUIDEWIRE BALL NOSE 80CM (WIRE) ×2 IMPLANT
HANDPIECE INTERPULSE COAX TIP (DISPOSABLE) ×3
HOLDER FOLEY CATH W/STRAP (MISCELLANEOUS) ×3 IMPLANT
HOOD PEEL AWAY FLYTE STAYCOOL (MISCELLANEOUS) ×6 IMPLANT
MARKER SKIN DUAL TIP RULER LAB (MISCELLANEOUS) ×3 IMPLANT
NDL SPNL 18GX3.5 QUINCKE PK (NEEDLE) ×1 IMPLANT
NEEDLE SPNL 18GX3.5 QUINCKE PK (NEEDLE) ×3 IMPLANT
PACK ANTERIOR HIP CUSTOM (KITS) ×3 IMPLANT
SAW OSC TIP CART 19.5X105X1.3 (SAW) ×3 IMPLANT
SEALER BIPOLAR AQUA 6.0 (INSTRUMENTS) ×3 IMPLANT
SET HNDPC FAN SPRY TIP SCT (DISPOSABLE) ×1 IMPLANT
SOL PREP POV-IOD 4OZ 10% (MISCELLANEOUS) ×1 IMPLANT
SUT ETHIBOND NAB CT1 #1 30IN (SUTURE) ×6 IMPLANT
SUT MNCRL AB 3-0 PS2 18 (SUTURE) ×3 IMPLANT
SUT MON AB 2-0 CT1 36 (SUTURE) ×6 IMPLANT
SUT STRATAFIX PDO 1 14 VIOLET (SUTURE) ×3
SUT STRATFX PDO 1 14 VIOLET (SUTURE) ×1
SUT VIC AB 0 CT1 27 (SUTURE) ×6
SUT VIC AB 0 CT1 27XBRD ANTBC (SUTURE) IMPLANT
SUT VIC AB 2-0 CT1 27 (SUTURE) ×3
SUT VIC AB 2-0 CT1 TAPERPNT 27 (SUTURE) ×1 IMPLANT
SUTURE STRATFX PDO 1 14 VIOLET (SUTURE) ×1 IMPLANT
SYR 50ML LL SCALE MARK (SYRINGE) ×1 IMPLANT
TRAY FOLEY W/METER SILVER 16FR (SET/KITS/TRAYS/PACK) ×2 IMPLANT
WATER STERILE IRR 1500ML POUR (IV SOLUTION) ×3 IMPLANT
YANKAUER SUCT BULB TIP 10FT TU (MISCELLANEOUS) ×3 IMPLANT

## 2016-11-07 NOTE — Anesthesia Preprocedure Evaluation (Addendum)
Anesthesia Evaluation  Patient identified by MRN, date of birth, ID band Patient awake    Reviewed: Allergy & Precautions, NPO status , Patient's Chart, lab work & pertinent test results, reviewed documented beta blocker date and time   Airway Mallampati: II  TM Distance: >3 FB Neck ROM: Full    Dental no notable dental hx.    Pulmonary neg pulmonary ROS,    Pulmonary exam normal breath sounds clear to auscultation       Cardiovascular hypertension, Pt. on medications and Pt. on home beta blockers + DOE  Normal cardiovascular exam Rhythm:Regular Rate:Normal  Echo 12/2105 - Left ventricle: The cavity size was mildly dilated. Wall   thickness was normal. Systolic function was normal. The estimated ejection fraction was in the range of 55% to 60%. Wall motion was normal; there were no regional wall motion abnormalities. Abnormal relaxation with normal filling pressures. - Mitral valve: Systolic bowing without prolapse. - Left atrium: The atrium was mildly dilated.  Impressions: - LV apical aneurysm seen on LV angiogram from 2013 is not seen on the current echo study. Consider repeat imaging with Definity microbubble contrast.   NM Stress 12/2015  Nuclear stress EF: 53%.  There was no ST segment deviation noted during stress.  This is a low risk study.  The left ventricular ejection fraction is mildly decreased (45-54%).   Low risk stress nuclear study with small, severe intensity, partially reversible apical defect and small, moderate intensity, fixed inferior basal defect; findings consistent with mild apical ischemia and inferior basal thinning; EF 53 with normal wall motion.   Neuro/Psych negative neurological ROS  negative psych ROS   GI/Hepatic negative GI ROS, Neg liver ROS,   Endo/Other  negative endocrine ROS  Renal/GU negative Renal ROS     Musculoskeletal  (+) Arthritis , Osteoarthritis,    Abdominal (+)  + obese,   Peds  Hematology negative hematology ROS (+)   Anesthesia Other Findings   Reproductive/Obstetrics                            Anesthesia Physical Anesthesia Plan  ASA: III  Anesthesia Plan:    Post-op Pain Management:    Induction:   Airway Management Planned:   Additional Equipment:   Intra-op Plan:   Post-operative Plan:   Informed Consent: I have reviewed the patients History and Physical, chart, labs and discussed the procedure including the risks, benefits and alternatives for the proposed anesthesia with the patient or authorized representative who has indicated his/her understanding and acceptance.   Dental advisory given  Plan Discussed with: CRNA  Anesthesia Plan Comments:         Anesthesia Quick Evaluation

## 2016-11-07 NOTE — Interval H&P Note (Signed)
History and Physical Interval Note:  11/07/2016 2:57 PM  Tony Howard  has presented today for surgery, with the diagnosis of DJD Left Hip  The various methods of treatment have been discussed with the patient and family. After consideration of risks, benefits and other options for treatment, the patient has consented to  Procedure(s) with comments: LEFT TOTAL HIP ARTHROPLASTY ANTERIOR APPROACH (Left) - Needs RNFA as a surgical intervention .  The patient's history has been reviewed, patient examined, no change in status, stable for surgery.  I have reviewed the patient's chart and labs.  Questions were answered to the patient's satisfaction.     Melenda Bielak, Cloyde ReamsBrian James

## 2016-11-07 NOTE — Progress Notes (Signed)
ANTICOAGULATION CONSULT NOTE - Initial Consult  Pharmacy Consult for Warfarin Indication: VTE prophylaxis, LV apical aneurysm  Allergies  Allergen Reactions  . Naprosyn [Naproxen] Hives  . Iodine Hives and Rash    shellfish    Patient Measurements: Height: 6\' 4"  (193 cm) Weight: (!) 312 lb (141.5 kg) IBW/kg (Calculated) : 86.8  Vital Signs: Temp: 97.6 F (36.4 C) (11/30 2022) Temp Source: Oral (11/30 1345) BP: 127/85 (11/30 2022) Pulse Rate: 48 (11/30 2022)  Labs:  Recent Labs  11/07/16 1406  LABPROT 13.5  INR 1.03    Estimated Creatinine Clearance: 116.7 mL/min (by C-G formula based on SCr of 1.1 mg/dL).   Medical History: Past Medical History:  Diagnosis Date  . Aneurysm of heart    LHC 03/13/12:  Normal cors, EF 60-65% with small but definite apical aneurysm.  He was started on coumadin as thrombus formation was a high probability  . Cardiac aneurysm   . Chronic pain   . Cutaneous sarcoidosis (HCC)   . CVA (cerebral infarction)    Head CT showing encephalomalacia c/w old infarct in the distribution of the right PCA in April 2013  . DJD (degenerative joint disease)   . Family history of adverse reaction to anesthesia    pts mother and pts sister had difficulty with breathing   . History of kidney stones   . History of stroke   . Hypertension    echo 4/13:  mild LVH, EF 65-70%, grade 1 diast dysfxn, mild LAE  . Left sided numbness   . Osteoarthritis   . Sarcoidosis of lung (HCC)    Myoview 3/13 with EF 47%; anterior ischemia;  LHC without CAD;  MRI recommended to r/o cardiac sarcoid - but patient claustrophobic and MRI not done  . Stroke (HCC)   . Swallowing difficulty     Medications:  Scheduled:  . [START ON 11/08/2016] atorvastatin  40 mg Oral Daily  . carvedilol  6.25 mg Oral BID  .  ceFAZolin (ANCEF) IV  2 g Intravenous Q6H  . [START ON 11/08/2016] dexamethasone  10 mg Intravenous Once  . docusate sodium  100 mg Oral BID  . [START ON 11/08/2016]  enoxaparin (LOVENOX) injection  40 mg Subcutaneous Q12H  . [START ON 11/08/2016] furosemide  20 mg Oral Daily  . [START ON 11/08/2016] hydrochlorothiazide  25 mg Oral Daily  . lisinopril  40 mg Oral Daily  . oxyCODONE  20 mg Oral Q12H  . senna  2 tablet Oral QHS   Infusions:  . sodium chloride     PRN: acetaminophen **OR** acetaminophen, HYDROmorphone (DILAUDID) injection, menthol-cetylpyridinium **OR** phenol, metoCLOPramide **OR** metoCLOPramide (REGLAN) injection, ondansetron **OR** ondansetron (ZOFRAN) IV, oxyCODONE, polyethylene glycol  Assessment: 55 yo male on chronic warfarin for hx LV apical aneurysm, home dose 10mg  daily except 5mg  on Mon/Fri, last dose taken 11/24 in anticipation of surgery.  Has been on Lovenox 150mg  q12h 11/26-11/29.  Now s/p L THA on 11/30 and Pharmacy consulted to resume warfarin post-op.  Goal of Therapy:  INR 2-3 Monitor platelets by anticoagulation protocol: Yes   Plan:   Warfarin 10mg  PO x 1 tonight  Daily PT/INR  Lovenox 40mg  SQ q12h per Ortho starting 12/1 AM, d/c when INR >/= 1.8  Loralee PacasErin Kenly Xiao, PharmD, BCPS Pager: [phone] 11/07/2016,8:38 PM

## 2016-11-07 NOTE — Anesthesia Postprocedure Evaluation (Signed)
Anesthesia Post Note  Patient: Tony Howard  Procedure(s) Performed: Procedure(s) (LRB): LEFT TOTAL HIP ARTHROPLASTY ANTERIOR APPROACH (Left)  Patient location during evaluation: PACU Anesthesia Type: Spinal and MAC Level of consciousness: awake and alert Pain management: pain level controlled Vital Signs Assessment: post-procedure vital signs reviewed and stable Respiratory status: spontaneous breathing and respiratory function stable Cardiovascular status: blood pressure returned to baseline and stable Postop Assessment: spinal receding Anesthetic complications: no    Last Vitals:  Vitals:   11/07/16 2000 11/07/16 2022  BP: (!) 140/104 127/85  Pulse: (!) 50 (!) 48  Resp: 14 18  Temp: 36.5 C 36.4 C    Last Pain:  Vitals:   11/07/16 1930  TempSrc:   PainSc: 5                  Lewie LoronJohn Glen Allen Wenzlick

## 2016-11-07 NOTE — Transfer of Care (Addendum)
Immediate Anesthesia Transfer of Care Note  Patient: Tony Howard  Procedure(s) Performed: Procedure(s) with comments: LEFT TOTAL HIP ARTHROPLASTY ANTERIOR APPROACH (Left) - Needs RNFA  Patient Location: PACU  Anesthesia Type:Spinal and MAC  Level of Consciousness:  sedated, patient cooperative and responds to stimulation  Airway & Oxygen Therapy:Patient Spontanous Breathing and Patient connected to face mask oxgen  Post-op Assessment:  Report given to PACU RN and Post -op Vital signs reviewed and stable  Post vital signs:  Reviewed and stable  Last Vitals:  Vitals:   11/07/16 1345  BP: (!) 156/113  Pulse: 60  Resp: (!) 22  Temp: 36.4 C    Complications: No apparent anesthesia complications

## 2016-11-07 NOTE — H&P (View-Only) (Signed)
TOTAL HIP ADMISSION H&P  Patient is admitted for left total hip arthroplasty.  Subjective:  Chief Complaint: left hip pain  HPI: Tony Howard, 55 y.o. male, has a history of pain and functional disability in the left hip(s) due to arthritis and patient has failed non-surgical conservative treatments for greater than 12 weeks to include NSAID's and/or analgesics, flexibility and strengthening excercises, use of assistive devices, weight reduction as appropriate and activity modification.  Onset of symptoms was gradual starting 6 years ago with rapidlly worsening course since that time.The patient noted no past surgery on the left hip(s).  Patient currently rates pain in the left hip at 10 out of 10 with activity. Patient has night pain, worsening of pain with activity and weight bearing, pain that interfers with activities of daily living, pain with passive range of motion and crepitus. Patient has evidence of subchondral cysts, subchondral sclerosis, periarticular osteophytes, joint subluxation and joint space narrowing by imaging studies. This condition presents safety issues increasing the risk of falls. This patient has had avascular necrosis of the hip, acetabular fracture, hip dysplasia.  There is no current active infection.  Patient Active Problem List   Diagnosis Date Noted  . Chest pain 11/28/2015  . Encounter for long-term (current) use of antiplatelets/antithrombotics 03/03/2014  . Edema 04/23/2012  . Bradycardia 04/15/2012  . 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 Long term (current) use of anticoagulants 03/18/2012  . Chest pain on exertion 03/10/2012  . DOE (dyspnea on exertion) 03/10/2012  . Sarcoidosis (HCC) 03/10/2012  . HTN (hypertension) 03/10/2012  . Noncompliance 03/10/2012   Past Medical History:  Diagnosis Date  . Aneurysm of heart    LHC 03/13/12:  Normal cors, EF 60-65% with small but definite apical aneurysm.  He was started on coumadin as thrombus formation was a high  probability  . Cardiac aneurysm   . Chronic pain   . Cutaneous sarcoidosis (HCC)   . CVA (cerebral infarction)    Head CT showing encephalomalacia c/w old infarct in the distribution of the right PCA in April 2013  . DJD (degenerative joint disease)   . Family history of adverse reaction to anesthesia    pts mother and pts sister had difficulty with breathing   . History of kidney stones   . History of stroke   . Hypertension    echo 4/13:  mild LVH, EF 65-70%, grade 1 diast dysfxn, mild LAE  . Left sided numbness   . Osteoarthritis   . Sarcoidosis of lung (HCC)    Myoview 3/13 with EF 47%; anterior ischemia;  LHC without CAD;  MRI recommended to r/o cardiac sarcoid - but patient claustrophobic and MRI not done  . Stroke (HCC)   . Swallowing difficulty     Past Surgical History:  Procedure Laterality Date  . CYSTOSCOPY W/ URETERAL STENT PLACEMENT    . KNEE SURGERY     right / childhood   . LEFT HEART CATHETERIZATION WITH CORONARY ANGIOGRAM N/A 03/13/2012   Procedure: LEFT HEART CATHETERIZATION WITH CORONARY ANGIOGRAM;  Surgeon: Philip J Nahser, MD;  Location: [facility] CATH [facility];  Service: Cardiovascular;  Laterality: N/A;  Contrast allergy  . WISDOM TOOTH EXTRACTION     4     (Not in a hospital admission) Allergies  Allergen Reactions  . Naprosyn [Naproxen] Hives  . Iodine Hives and Rash    shellfish    Social History  Substance Use Topics  . Smoking status: Never Smoker  . Smokeless tobacco: Never Used  .   Alcohol use Yes     Comment: occas     Family History  Problem Relation Age of Onset  . Lung cancer Mother   . Multiple sclerosis Sister      Review of Systems  Constitutional: Negative.   HENT: Negative.   Eyes: Negative.   Respiratory: Negative.   Cardiovascular: Positive for leg swelling.  Gastrointestinal: Negative.   Genitourinary: Negative.   Musculoskeletal: Positive for back pain and joint pain.  Skin: Negative.   Neurological: Negative.    Endo/Heme/Allergies: Negative.   Psychiatric/Behavioral: Negative.     Objective:  Physical Exam  Vital signs in last 24 hours: @VSRANGES @  Labs:   Estimated body mass index is 38.1 kg/m as calculated from the following:   Height as of 09/20/16: 6\' 4"  (1.93 m).   Weight as of 09/20/16: 142 kg (313 lb).   Imaging Review Plain radiographs demonstrate severe degenerative joint disease of the left hip(s). The bone quality appears to be adequate for age and reported activity level.  Assessment/Plan:  End stage arthritis, left hip(s)  The patient history, physical examination, clinical judgement of the provider and imaging studies are consistent with end stage degenerative joint disease of the left hip(s) and total hip arthroplasty is deemed medically necessary. The treatment options including medical management, injection therapy, arthroscopy and arthroplasty were discussed at length. The risks and benefits of total hip arthroplasty were presented and reviewed. The risks due to aseptic loosening, infection, stiffness, dislocation/subluxation,  thromboembolic complications and other imponderables were discussed.  The patient acknowledged the explanation, agreed to proceed with the plan and consent was signed. Patient is being admitted for inpatient treatment for surgery, pain control, PT, OT, prophylactic antibiotics, VTE prophylaxis, progressive ambulation and ADL's and discharge planning.The patient is planning to be discharged home with home health services. Coumadin with lovenox bridge.

## 2016-11-07 NOTE — Discharge Instructions (Signed)
Dr. Samson FredericBrian Praveen Coia Joint Replacement Specialist [facility] [address] [place], [place] [address] [phone]   TOTAL HIP REPLACEMENT POSTOPERATIVE DIRECTIONS    Hip Rehabilitation, Guidelines Following Surgery   WEIGHT BEARING Partial weight bearing with assist device as directed.  50% weight bearing with a walker  The results of a hip operation are greatly improved after range of motion and muscle strengthening exercises. Follow all safety measures which are given to protect your hip. If any of these exercises cause increased pain or swelling in your joint, decrease the amount until you are comfortable again. Then slowly increase the exercises. Call your caregiver if you have problems or questions.   HOME CARE INSTRUCTIONS  Most of the following instructions are designed to prevent the dislocation of your new hip.  Remove items at home which could result in a fall. This includes throw rugs or furniture in walking pathways.  Continue medications as instructed at time of discharge.  You may have some home medications which will be placed on hold until you complete the course of blood thinner medication.  You may start showering once you are discharged home. Do not remove your dressing. Do not put on socks or shoes without following the instructions of your caregivers.   Sit on chairs with arms. Use the chair arms to help push yourself up when arising.  Arrange for the use of a toilet seat elevator so you are not sitting low.   Walk with walker as instructed.  You may resume a sexual relationship in one month or when given the OK by your caregiver.  Use walker as long as suggested by your caregivers.  You may put full weight on your legs and walk as much as is comfortable. Avoid periods of inactivity such as sitting longer than an hour when not asleep. This helps prevent blood clots.  You may return to work once you are cleared by Designer, industrial/productyour surgeon.  Do  not drive a car for 6 weeks or until released by your surgeon.  Do not drive while taking narcotics.  Wear elastic stockings for two weeks following surgery during the day but you may remove then at night.  Make sure you keep all of your appointments after your operation with all of your doctors and caregivers. You should call the office at the above phone number and make an appointment for approximately two weeks after the date of your surgery. Please pick up a stool softener and laxative for home use as long as you are requiring pain medications.  ICE to the affected hip every three hours for 30 minutes at a time and then as needed for pain and swelling. Continue to use ice on the hip for pain and swelling from surgery. You may notice swelling that will progress down to the foot and ankle.  This is normal after surgery.  Elevate the leg when you are not up walking on it.   It is important for you to complete the blood thinner medication as prescribed by your doctor.  Continue to use the breathing machine which will help keep your temperature down.  It is common for your temperature to cycle up and down following surgery, especially at night when you are not up moving around and exerting yourself.  The breathing machine keeps your lungs expanded and your temperature down.  RANGE OF MOTION AND STRENGTHENING EXERCISES  These exercises are designed to help you keep full movement of your hip joint. Follow your  caregiver's or physical therapist's instructions. Perform all exercises about fifteen times, three times per day or as directed. Exercise both hips, even if you have had only one joint replacement. These exercises can be done on a training (exercise) mat, on the floor, on a table or on a bed. Use whatever works the best and is most comfortable for you. Use music or television while you are exercising so that the exercises are a pleasant break in your day. This will make your life better with the  exercises acting as a break in routine you can look forward to.  Lying on your back, slowly slide your foot toward your buttocks, raising your knee up off the floor. Then slowly slide your foot back down until your leg is straight again.  Lying on your back spread your legs as far apart as you can without causing discomfort.  Lying on your side, raise your upper leg and foot straight up from the floor as far as is comfortable. Slowly lower the leg and repeat.  Lying on your back, tighten up the muscle in the front of your thigh (quadriceps muscles). You can do this by keeping your leg straight and trying to raise your heel off the floor. This helps strengthen the largest muscle supporting your knee.  Lying on your back, tighten up the muscles of your buttocks both with the legs straight and with the knee bent at a comfortable angle while keeping your heel on the floor.   SKILLED REHAB INSTRUCTIONS: If the patient is transferred to a skilled rehab facility following release from the hospital, a list of the current medications will be sent to the facility for the patient to continue.  When discharged from the skilled rehab facility, please have the facility set up the patient's Home Health Physical Therapy prior to being released. Also, the skilled facility will be responsible for providing the patient with their medications at time of release from the facility to include their pain medication and their blood thinner medication. If the patient is still at the rehab facility at time of the two week follow up appointment, the skilled rehab facility will also need to assist the patient in arranging follow up appointment in our office and any transportation needs.  MAKE SURE YOU:  Understand these instructions.  Will watch your condition.  Will get help right away if you are not doing well or get worse.  Pick up stool softner and laxative for home use following surgery while on pain medications. Do not  remove your dressing. The dressing is waterproof--it is OK to take showers. Continue to use ice for pain and swelling after surgery. Do not use any lotions or creams on the incision until instructed by your surgeon. Total Hip Protocol.

## 2016-11-07 NOTE — Anesthesia Procedure Notes (Signed)
Spinal  Patient location during procedure: OR Staffing Anesthesiologist: GERMEROTH, JOHN Performed: anesthesiologist  Preanesthetic Checklist Completed: patient identified, site marked, surgical consent, pre-op evaluation, timeout performed, IV checked, risks and benefits discussed and monitors and equipment checked Spinal Block Patient position: sitting Prep: ChloraPrep Patient monitoring: heart rate, continuous pulse ox and blood pressure Approach: midline Location: L3-4 Injection technique: single-shot Needle Needle type: Sprotte  Needle gauge: 24 G Needle length: 9 cm Additional Notes Expiration date of kit checked and confirmed. Patient tolerated procedure well, without complications.       

## 2016-11-07 NOTE — Op Note (Signed)
OPERATIVE REPORT  SURGEON: Samson FredericBrian Jamelah Sitzer, MD   ASSISTANT: Alphonsa OverallBrad Dixon, PA-C.  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   PROCEDURE: Left total hip arthroplasty, anterior approach.   IMPLANTS: DePuy Tri Lock stem, size 8, hi offset. DePuy Pinnacle Cup, size 62 mm. DePuy Altrx liner, size 36 by 62 mm, neutral. DePuy Biolox ceramic head ball, size 36 + 5 mm.  ANESTHESIA:  Spinal  ESTIMATED BLOOD LOSS: 400 ml.  ANTIBIOTICS: 3 g Ancef.  DRAINS: None.  COMPLICATIONS: None.   CONDITION: PACU - hemodynamically stable.Marland Kitchen.   BRIEF CLINICAL NOTE: Tony Howard is a 55 y.o. male with a long-standing history of Left hip arthritis secondary to avascular necrosis. After failing conservative management, the patient was indicated for total hip arthroplasty. The risks, benefits, and alternatives to the procedure were explained, and the patient elected to proceed.  PROCEDURE IN DETAIL: Surgical site was marked by myself. Spinal anesthesia was obtained in the pre-op holding area. Once inside the operative room, a foley catheter was inserted. The patient was then positioned on the Hana table. All bony prominences were well padded. The hip was prepped and draped in the normal sterile surgical fashion. A time-out was called verifying side and site of surgery. The patient received IV antibiotics within 60 minutes of beginning the procedure.  The direct anterior approach to the hip was performed through the Hueter interval. Lateral femoral circumflex vessels were treated with the Auqumantys. The anterior capsule was exposed and an inverted T capsulotomy was made.The femoral neck cut was made to the level of the templated cut. A corkscrew was placed into the head and the head was removed. The femoral head was found to have eburnated bone. The head was passed to the back table and  was measured.  Acetabular exposure was achieved, and the pulvinar and labrum were excised. Sequental reaming of the acetabulum was then performed up to a size 61 mm reamer. A 62 mm cup was then opened and impacted into place at approximately 40 degrees of abduction and 20 degrees of anteversion. The final polyethylene liner was impacted into place and acetabular osteophytes were removed.   I then gained femoral exposure taking care to protect the abductors and greater trochanter. This was performed using standard external rotation, extension, and adduction. The capsule was peeled off the inner aspect of the greater trochanter, taking care to preserve the short external rotators. A cookie cutter was used to enter the femoral canal, and then the femoral canal finder was placed. Upon sequential broaching, it became apparent that the broach was binding distally. Therefore, I placed a ball-tipped guidewire and I used flexible reamers. Sequential broaching was performed up to a size 8. Calcar planer was used on the femoral neck remnant. I placed a hi offset neck and a trial head ball. The hip was reduced. Leg lengths and offset were checked fluoroscopically. The hip was dislocated and trial components were removed. The final implants were placed, and the hip was reduced.  Fluoroscopy was used to confirm component position and leg lengths. At 90 degrees of external rotation and full extension, the hip was stable to an anterior directed force.  The wound was copiously irrigated with a dilute betadine solution followed by normal saline. Marcaine solution was injected into the periarticular soft tissue. The wound was closed in layers using #1 Vicryl and strata fix for the fascia, 2-0 Vicryl for the subcutaneous fat,  2-0 Monocryl for the deep dermal layer, 3-0 running Monocryl subcuticular stitch, and Dermabond for the skin. Once the glue was fully dried, an Aquacell Ag dressing was applied. The patient  was transported to the recovery room in stable condition. Sponge, needle, and instrument counts were correct at the end of the case x2. The patient tolerated the procedure well and there were no known complications.  Please note that a surgical assistant was a medical necessity for this procedure to perform it in a safe and expeditious manner. Assistant was necessary to provide appropriate retraction of vital neurovascular structures, to prevent femoral fracture, and to allow for anatomic placement of the prosthesis.

## 2016-11-08 ENCOUNTER — Encounter (HOSPITAL_COMMUNITY): Payer: Self-pay | Admitting: Orthopedic Surgery

## 2016-11-08 LAB — CBC
HCT: 36.5 % — ABNORMAL LOW (ref 39.0–52.0)
Hemoglobin: 12.2 g/dL — ABNORMAL LOW (ref 13.0–17.0)
MCH: 30.6 pg (ref 26.0–34.0)
MCHC: 33.4 g/dL (ref 30.0–36.0)
MCV: 91.5 fL (ref 78.0–100.0)
PLATELETS: 147 10*3/uL — AB (ref 150–400)
RBC: 3.99 MIL/uL — ABNORMAL LOW (ref 4.22–5.81)
RDW: 12.8 % (ref 11.5–15.5)
WBC: 12.1 10*3/uL — ABNORMAL HIGH (ref 4.0–10.5)

## 2016-11-08 LAB — BASIC METABOLIC PANEL
Anion gap: 7 (ref 5–15)
BUN: 24 mg/dL — AB (ref 6–20)
CALCIUM: 8.6 mg/dL — AB (ref 8.9–10.3)
CHLORIDE: 105 mmol/L (ref 101–111)
CO2: 25 mmol/L (ref 22–32)
CREATININE: 1.28 mg/dL — AB (ref 0.61–1.24)
GFR calc non Af Amer: >60 mL/min (ref >60)
Glucose, Bld: 153 mg/dL — ABNORMAL HIGH (ref 65–99)
Potassium: 3.9 mmol/L (ref 3.5–5.1)
SODIUM: 137 mmol/L (ref 135–145)

## 2016-11-08 LAB — PROTIME-INR
INR: 1.09
PROTHROMBIN TIME: 14.2 s (ref 11.4–15.2)

## 2016-11-08 MED ORDER — WARFARIN SODIUM 5 MG PO TABS
10.0000 mg | ORAL_TABLET | Freq: Once | ORAL | Status: AC
  Administered 2016-11-08: 10 mg via ORAL
  Filled 2016-11-08: qty 2

## 2016-11-08 MED ORDER — ZOLPIDEM TARTRATE 5 MG PO TABS
5.0000 mg | ORAL_TABLET | Freq: Every evening | ORAL | Status: DC | PRN
  Administered 2016-11-08 – 2016-11-10 (×3): 5 mg via ORAL
  Filled 2016-11-08 (×3): qty 1

## 2016-11-08 MED ORDER — OXYCODONE HCL 5 MG PO TABS: 5.0000 mg | ORAL_TABLET | ORAL | 0 refills | Status: DC | PRN

## 2016-11-08 MED ORDER — OXYCODONE HCL ER 20 MG PO T12A: 20.0000 mg | EXTENDED_RELEASE_TABLET | Freq: Two times a day (BID) | ORAL | 0 refills | Status: DC

## 2016-11-08 MED ORDER — SENNA 8.6 MG PO TABS: 2.0000 | ORAL_TABLET | Freq: Every day | ORAL | 0 refills | Status: DC

## 2016-11-08 MED ORDER — ENOXAPARIN SODIUM 40 MG/0.4ML ~~LOC~~ SOLN: 40.0000 mg | Freq: Two times a day (BID) | SUBCUTANEOUS | 0 refills | Status: DC

## 2016-11-08 MED ORDER — METHOCARBAMOL 500 MG PO TABS
500.0000 mg | ORAL_TABLET | Freq: Four times a day (QID) | ORAL | Status: DC | PRN
  Administered 2016-11-08 – 2016-11-11 (×6): 500 mg via ORAL
  Filled 2016-11-08 (×6): qty 1

## 2016-11-08 MED ORDER — ONDANSETRON HCL 4 MG PO TABS: 4.0000 mg | ORAL_TABLET | Freq: Four times a day (QID) | ORAL | 0 refills | Status: DC | PRN

## 2016-11-08 MED ORDER — DOCUSATE SODIUM 100 MG PO CAPS: 100.0000 mg | ORAL_CAPSULE | Freq: Two times a day (BID) | ORAL | 0 refills | Status: DC

## 2016-11-08 NOTE — Progress Notes (Signed)
   Subjective:  Patient reports pain as mild to moderate.  No c/o.  Objective:   VITALS:   Vitals:   11/07/16 2217 11/07/16 2326 11/08/16 0120 11/08/16 0615  BP: (!) 131/95 137/83 129/89 125/79  Pulse: (!) 56 (!) 59 (!) 56 (!) 53  Resp:  18 20 20   Temp:  97.7 F (36.5 C) 97.8 F (36.6 C) 97.5 F (36.4 C)  TempSrc:  Oral Oral Oral  SpO2:  100% 100% 95%  Weight:      Height:        NAD ABD soft Sensation intact distally Intact pulses distally Dorsiflexion/Plantar flexion intact Incision: dressing C/D/I Compartment soft    Lab Results  Component Value Date   WBC 12.1 (H) 11/08/2016   HGB 12.2 (L) 11/08/2016   HCT 36.5 (L) 11/08/2016   MCV 91.5 11/08/2016   PLT 147 (L) 11/08/2016   BMET    Component Value Date/Time   NA 137 11/08/2016 0358   K 3.9 11/08/2016 0358   CL 105 11/08/2016 0358   CO2 25 11/08/2016 0358   GLUCOSE 153 (H) 11/08/2016 0358   BUN 24 (H) 11/08/2016 0358   CREATININE 1.28 (H) 11/08/2016 0358   CREATININE 1.29 11/27/2015 1539   CALCIUM 8.6 (L) 11/08/2016 0358   GFRNONAA >60 11/08/2016 0358   GFRAA >60 11/08/2016 0358     Assessment/Plan: 1 Day Post-Op   Active Problems:   Avascular necrosis of hip, left (HCC)   50% WB LLE with walker DVT ppx: coumadin with lovenox bridge, SCDs, TEDs PO pain control PT/OT Dispo: D/C home with HHPT, likely tomorrow, f/u with PCP on Monday for INR check    Tony Howard, Tony Howard 11/08/2016, 9:42 AM   Tony FredericBrian Curtistine Pettitt, MD Cell [phone]

## 2016-11-08 NOTE — Evaluation (Signed)
Physical Therapy Evaluation Patient Details Name: Tony Howard MRN: [id number] DOB: 10/12/1961 Today's Date: 11/08/2016   History of Present Illness  Left total hip arthroplasty, anterior approach  Clinical Impression  Pt s/p L THR and presents with decreased L LE strength/ROM, post op pain and PWB status limiting functional mobility.  Pt hopes to progress to dc home with assist of family and HHPT follow up.    Follow Up Recommendations Home health PT    Equipment Recommendations  Rolling walker with 5" wheels (Pt is 6'4 and over 300 lbs)    Recommendations for Other Services OT consult     Precautions / Restrictions Precautions Precautions: Fall Restrictions Weight Bearing Restrictions: Yes LLE Weight Bearing: Partial weight bearing LLE Partial Weight Bearing Percentage or Pounds: 50%      Mobility  Bed Mobility Overal bed mobility: Needs Assistance Bed Mobility: Supine to Sit     Supine to sit: Mod assist     General bed mobility comments: increased time and verbal direction  Transfers Overall transfer level: Needs assistance Equipment used: Rolling walker (2 wheeled) Transfers: Sit to/from Stand Sit to Stand: Mod assist;+2 physical assistance;+2 safety/equipment;From elevated surface Stand pivot transfers: Mod assist;+2 physical assistance;+2 safety/equipment       General transfer comment: verbal instruction regarding hand and feet placement  Ambulation/Gait Ambulation/Gait assistance: Mod assist;+2 physical assistance;+2 safety/equipment Ambulation Distance (Feet): 2 Feet Assistive device: Rolling walker (2 wheeled) Gait Pattern/deviations: Step-to pattern;Decreased step length - right;Decreased step length - left;Shuffle;Trunk flexed Gait velocity: decr Gait velocity interpretation: Below normal speed for age/gender General Gait Details: cues for sequence, PWB, posture and position from RW.  Distance ltd by c/o increasing dizziness - BP  144/91  Stairs            Wheelchair Mobility    Modified Rankin (Stroke Patients Only)       Balance                                             Pertinent Vitals/Pain Pain Assessment: 0-10 Pain Score: 6  Pain Location: L hip/thigh Pain Descriptors / Indicators: Aching;Burning;Sore;Spasm Pain Intervention(s): Limited activity within patient's tolerance;Monitored during session;Premedicated before session;Ice applied    Home Living Family/patient expects to be discharged to:: Private residence Living Arrangements: Spouse/significant other Available Help at Discharge: Family Type of Home: Mobile home Home Access: Stairs to enter Entrance Stairs-Rails: Left;Right;Can reach both Entrance Stairs-Number of Steps: 4 Home Layout: One level Home Equipment: Cane - single point      Prior Function Level of Independence: Independent with assistive device(s)               Hand Dominance        Extremity/Trunk Assessment   Upper Extremity Assessment: Overall WFL for tasks assessed           Lower Extremity Assessment: LLE deficits/detail   LLE Deficits / Details: strength at hip 2/5 with AAROM at hip to 75 flex and 10 abd  Cervical / Trunk Assessment: Normal  Communication   Communication: No difficulties  Cognition Arousal/Alertness: Awake/alert Behavior During Therapy: WFL for tasks assessed/performed Overall Cognitive Status: Within Functional Limits for tasks assessed                      General Comments      Exercises Total Joint Exercises Ankle Circles/Pumps:  AROM;Both;Supine;15 reps Quad Sets: AROM;Both;10 reps;Supine Heel Slides: AAROM;20 reps;Supine;Left Hip ABduction/ADduction: AAROM;Left;15 reps;Supine   Assessment/Plan    PT Assessment Patient needs continued PT services  PT Problem List Decreased strength;Decreased range of motion;Decreased activity tolerance;Decreased balance;Decreased mobility;Decreased  knowledge of use of DME;Obesity;Pain          PT Treatment Interventions DME instruction;Gait training;Stair training;Functional mobility training;Therapeutic activities;Therapeutic exercise;Patient/family education    PT Goals (Current goals can be found in the Care Plan section)  Acute Rehab PT Goals Patient Stated Goal: get well, less pain and get home PT Goal Formulation: With patient Potential to Achieve Goals: Fair    Frequency 7X/week   Barriers to discharge        Co-evaluation PT/OT/SLP Co-Evaluation/Treatment: Yes Reason for Co-Treatment: For patient/therapist safety PT goals addressed during session: Mobility/safety with mobility OT goals addressed during session: ADL's and self-care       End of Session Equipment Utilized During Treatment: Gait belt Activity Tolerance: Patient limited by fatigue;Other (comment) (dizziness) Patient left: in chair;with call bell/phone within reach;with family/visitor present Nurse Communication: Mobility status         Time: [id number] PT Time Calculation (min) (ACUTE ONLY): 50 min   Charges:   PT Evaluation $PT Eval Moderate Complexity: 1 Procedure PT Treatments $Therapeutic Exercise: 8-22 mins   PT G Codes:        Tony Howard 11/08/2016, 2:20 PM

## 2016-11-08 NOTE — Progress Notes (Signed)
Discharge planning, spoke with patient and spouse at bedside. Have chosen [facility] for HH PT. Contacted [facility] for referral. Needs RW and 3n1, contacted [facility] to deliver to room. [phone] 

## 2016-11-08 NOTE — Discharge Summary (Signed)
Physician Discharge Summary  Patient ID: EUAL LINDSTROM MRN: [id number] DOB/AGE: [birth year] 55 y.o.  Admit date: 11/07/2016 Discharge date: 11/11/2016  Admission Diagnoses:  Avascular necrosis of hip, left [facility])  Discharge Diagnoses:  Principal Problem:   Avascular necrosis of hip, left [facility])   Past Medical History:  Diagnosis Date  . Aneurysm of heart    LHC 03/13/12:  Normal cors, EF 60-65% with small but definite apical aneurysm.  He was started on coumadin as thrombus formation was a high probability  . Cardiac aneurysm   . Chronic pain   . Cutaneous sarcoidosis (HCC)   . CVA (cerebral infarction)    Head CT showing encephalomalacia c/w old infarct in the distribution of the right PCA in April 2013  . DJD (degenerative joint disease)   . Family history of adverse reaction to anesthesia    pts mother and pts sister had difficulty with breathing   . History of kidney stones   . History of stroke   . Hypertension    echo 4/13:  mild LVH, EF 65-70%, grade 1 diast dysfxn, mild LAE  . Left sided numbness   . Osteoarthritis   . Sarcoidosis of lung (HCC)    Myoview 3/13 with EF 47%; anterior ischemia;  LHC without CAD;  MRI recommended to r/o cardiac sarcoid - but patient claustrophobic and MRI not done  . Stroke (HCC)   . Swallowing difficulty     Surgeries: Procedure(s): LEFT TOTAL HIP ARTHROPLASTY ANTERIOR APPROACH on 11/07/2016   Consultants (if any):   Discharged Condition: Improved  Hospital Course: Aariz MALIKAH LAKEY is an 55 y.o. male who was admitted 11/07/2016 with a diagnosis of Avascular necrosis of hip, left (HCC) and went to the operating room on 11/07/2016 and underwent the above named procedures.    He was given perioperative antibiotics:  Anti-infectives    Start     Dose/Rate Route Frequency Ordered Stop   11/07/16 2200  ceFAZolin (ANCEF) IVPB 2g/100 mL premix     2 g 200 mL/hr over 30 Minutes Intravenous Every 6 hours 11/07/16 2033 11/08/16 0451    11/07/16 0600  ceFAZolin (ANCEF) 3 g in dextrose 5 % 50 mL IVPB     3 g 130 mL/hr over 30 Minutes Intravenous On call to O.R. 11/06/16 1421 11/07/16 1549    .  He was given sequential compression devices and coumadin with lovenox bridge for DVT prophylaxis.  Slow progress with PT due to 50% weight bearing restriction.  He benefited maximally from the hospital stay and there were no complications.    Recent vital signs:  Vitals:   11/10/16 2240 11/11/16 0500  BP: 128/70 124/75  Pulse: 87 77  Resp: 16 16  Temp: 99 F (37.2 C) 98 F (36.7 C)    Recent laboratory studies:  Lab Results  Component Value Date   HGB 11.4 (L) 11/10/2016   HGB 11.3 (L) 11/09/2016   HGB 12.2 (L) 11/08/2016   Lab Results  Component Value Date   WBC 7.3 11/10/2016   PLT 162 11/10/2016   Lab Results  Component Value Date   INR 1.52 11/11/2016   Lab Results  Component Value Date   NA 140 11/11/2016   K 4.1 11/11/2016   CL 106 11/11/2016   CO2 28 11/11/2016   BUN 26 (H) 11/11/2016   CREATININE 1.20 11/11/2016   GLUCOSE 116 (H) 11/11/2016    Discharge Medications:     Medication List    STOP taking these  medications   ibuprofen 800 MG tablet Commonly known as:  ADVIL,MOTRIN   LOVENOX 150 MG/ML injection Generic drug:  enoxaparin Replaced by:  enoxaparin 40 MG/0.4ML injection     TAKE these medications   atorvastatin 40 MG tablet Commonly known as:  LIPITOR Take 1 tablet (40 mg total) by mouth daily.   carvedilol 12.5 MG tablet Commonly known as:  COREG TAKE ONE-HALF TABLET BY MOUTH TWICE DAILY WITH  A  MEAL What changed:  how much to take  how to take this  when to take this  additional instructions   docusate sodium 100 MG capsule Commonly known as:  COLACE Take 1 capsule (100 mg total) by mouth 2 (two) times daily.   enoxaparin 40 MG/0.4ML injection Commonly known as:  LOVENOX Inject 0.4 mLs (40 mg total) into the skin every 12 (twelve) hours. Replaces:   LOVENOX 150 MG/ML injection   furosemide 20 MG tablet Commonly known as:  LASIX Take 1 tablet (20 mg total) by mouth daily.   hydrochlorothiazide 25 MG tablet Commonly known as:  HYDRODIURIL Take 1 tablet (25 mg total) by mouth daily.   lisinopril 40 MG tablet Commonly known as:  PRINIVIL,ZESTRIL Take 1 tablet (40 mg total) by mouth daily.   ondansetron 4 MG tablet Commonly known as:  ZOFRAN Take 1 tablet (4 mg total) by mouth every 6 (six) hours as needed for nausea.   oxyCODONE 20 mg 12 hr tablet Commonly known as:  OXYCONTIN Take 1 tablet (20 mg total) by mouth every 12 (twelve) hours. What changed:  how much to take  when to take this  reasons to take this  additional instructions   oxyCODONE 5 MG immediate release tablet Commonly known as:  Oxy IR/ROXICODONE Take 1-2 tablets (5-10 mg total) by mouth every 3 (three) hours as needed for breakthrough pain. What changed:  You were already taking a medication with the same name, and this prescription was added. Make sure you understand how and when to take each.   senna 8.6 MG Tabs tablet Commonly known as:  SENOKOT Take 2 tablets (17.2 mg total) by mouth at bedtime.   warfarin 10 MG tablet Commonly known as:  COUMADIN Take as directed by the coumadin clinic What changed:  how much to take  how to take this  when to take this  additional instructions            Durable Medical Equipment        Start     Ordered   11/09/16 1351  DME Walker rolling  Once    Comments:  bariatric  Question:  Patient needs a walker to treat with the following condition  Answer:  History of total left hip replacement   11/09/16 1350   11/09/16 1350  DME Bedside commode  Once    Comments:  bariatric  Question:  Patient needs a bedside commode to treat with the following condition  Answer:  Status post total hip replacement, left   11/09/16 1350      Diagnostic Studies: Dg Pelvis Portable  Result Date:  11/07/2016 CLINICAL DATA:  Status post LEFT hip replacement. EXAM: PORTABLE PELVIS 1-2 VIEWS COMPARISON:  MRI of the LEFT hip November 04, 2011 FINDINGS: Status post LEFT hip total arthroplasty with intact well-seated non cemented acetabular and femoral components. No fracture deformity. No dislocation. No destructive bony lesions. LEFT hip soft tissue swelling and subcutaneous gas consistent with recent surgery. IMPRESSION: Status post recent LEFT hip total arthroplasty  with expected postoperative change. Electronically Signed   By: Awilda Metroourtnay  Bloomer M.D.   On: 11/07/2016 19:38   Dg C-arm Gt 120 Min-no Report  Result Date: 11/07/2016 CLINICAL DATA:  Left hip arthroplasty. EXAM: OPERATIVE LEFT HIP (WITH PELVIS IF PERFORMED)  VIEWS TECHNIQUE: Fluoroscopic spot image(s) were submitted for interpretation post-operatively. COMPARISON:  10/06/2015 radiographs. FINDINGS: Spot films of the left hip demonstrate interval left total hip arthroplasty. No definite complicating features are identified on the last spot film. IMPRESSION: Left total hip arthroplasty. Electronically Signed   By: Harmon PierJeffrey  Hu M.D.   On: 11/07/2016 18:45   Dg Hip Operative Unilat With Pelvis Left  Result Date: 11/07/2016 CLINICAL DATA:  Left hip arthroplasty. EXAM: OPERATIVE LEFT HIP (WITH PELVIS IF PERFORMED)  VIEWS TECHNIQUE: Fluoroscopic spot image(s) were submitted for interpretation post-operatively. COMPARISON:  10/06/2015 radiographs. FINDINGS: Spot films of the left hip demonstrate interval left total hip arthroplasty. No definite complicating features are identified on the last spot film. IMPRESSION: Left total hip arthroplasty. Electronically Signed   By: Harmon PierJeffrey  Hu M.D.   On: 11/07/2016 18:45    Disposition: 01-Home or Self Care  Discharge Instructions    Call MD / Call 911    Complete by:  As directed    If you experience chest pain or shortness of breath, CALL 911 and be transported to the hospital emergency room.  If  you develope a fever above 101 F, pus (white drainage) or increased drainage or redness at the wound, or calf pain, call your surgeon's office.   Constipation Prevention    Complete by:  As directed    Drink plenty of fluids.  Prune juice may be helpful.  You may use a stool softener, such as Colace (over the counter) 100 mg twice a day.  Use MiraLax (over the counter) for constipation as needed.   Diet - low sodium heart healthy    Complete by:  As directed    Discharge instructions    Complete by:  As directed    50% weight bearing left leg with a walker Take coumadin daily at 6 PM as instructed. Inject 40 mg lovenox every 12 hours. Discontinue injections when INR > 1.8 Arrange for INR check this Wednesday 12/6 with your primary care doctor   Driving restrictions    Complete by:  As directed    No driving for 6 weeks   Increase activity slowly as tolerated    Complete by:  As directed    Lifting restrictions    Complete by:  As directed    No lifting for 6 weeks   TED hose    Complete by:  As directed    Use stockings (TED hose) for 2 weeks on both leg(s).  You may remove them at night for sleeping.      Follow-up Information    Omran Keelin, Cloyde ReamsBrian James, MD. Schedule an appointment as soon as possible for a visit in 2 week(s).   Specialty:  Orthopedic Surgery Why:  For wound re-check Contact information: [address] [place] [address] [phone]        Karie ChimeraEESE,BETTI D, MD. Schedule an appointment as soon as possible for a visit on 11/13/2016.   Specialty:  Family Medicine Why:  for INR check Contact information: [address] [place] [address] [phone]        [facility] Follow up.   Specialty:  Home Health Services Why:  physical therapy Contact information: [address] [place] [address] [phone]        [facility] Follow up.   Why:  rolling walker and 3n1 Contact  information: [address] [place] [address] [phone]            Signed: Garnet KoyanagiSwinteck, Joy Haegele James 11/11/2016, 7:42 AM

## 2016-11-08 NOTE — Progress Notes (Signed)
Physical Therapy Treatment Patient Details Name: Tony Howard MRN: [id number] DOB: 03/15/1961 Today's Date: 11/08/2016    History of Present Illness Left total hip arthroplasty, anterior approach    PT Comments    Pt cooperative but progressing slowly 2* c/o dizziness with mobility.   Follow Up Recommendations  Home health PT     Equipment Recommendations  Rolling walker with 5" wheels    Recommendations for Other Services OT consult     Precautions / Restrictions Precautions Precautions: Fall Restrictions Weight Bearing Restrictions: Yes LLE Weight Bearing: Partial weight bearing LLE Partial Weight Bearing Percentage or Pounds: 50%    Mobility  Bed Mobility Overal bed mobility: Needs Assistance Bed Mobility: Supine to Sit     Supine to sit: Mod assist     General bed mobility comments: increased time and verbal direction  Transfers Overall transfer level: Needs assistance Equipment used: Rolling walker (2 wheeled) Transfers: Sit to/from Stand Sit to Stand: Mod assist;+2 physical assistance;+2 safety/equipment;From elevated surface Stand pivot transfers: Mod assist;+2 physical assistance;+2 safety/equipment       General transfer comment: verbal instruction regarding hand and feet placement  Ambulation/Gait Ambulation/Gait assistance: +2 safety/equipment;+2 physical assistance;Min assist;Mod assist Ambulation Distance (Feet): 2 Feet Assistive device: Rolling walker (2 wheeled) Gait Pattern/deviations: Step-to pattern;Shuffle;Trunk flexed;Decreased step length - right;Decreased step length - left;Antalgic Gait velocity: decr Gait velocity interpretation: Below normal speed for age/gender General Gait Details: cues for sequence, PWB, posture and position from RW.  Distance ltd by c/o increasing dizziness - BP 105/88   Stairs            Wheelchair Mobility    Modified Rankin (Stroke Patients Only)       Balance Overall balance assessment:  Needs assistance Sitting-balance support: No upper extremity supported;Feet supported Sitting balance-Leahy Scale: Good     Standing balance support: Bilateral upper extremity supported Standing balance-Leahy Scale: Poor                      Cognition Arousal/Alertness: Awake/alert Behavior During Therapy: WFL for tasks assessed/performed Overall Cognitive Status: Within Functional Limits for tasks assessed                      Exercises Total Joint Exercises Ankle Circles/Pumps: AROM;Both;Supine;15 reps Quad Sets: AROM;Both;10 reps;Supine Heel Slides: AAROM;20 reps;Supine;Left Hip ABduction/ADduction: AAROM;Left;15 reps;Supine    General Comments        Pertinent Vitals/Pain Pain Assessment: 0-10 Pain Score: 6  Pain Location: L hip/thigh Pain Descriptors / Indicators: Aching;Spasm;Sore Pain Intervention(s): Limited activity within patient's tolerance;Monitored during session;Premedicated before session    Home Living Family/patient expects to be discharged to:: Private residence Living Arrangements: Spouse/significant other Available Help at Discharge: Family Type of Home: Mobile home Home Access: Stairs to enter Entrance Stairs-Rails: Left;Right;Can reach both Home Layout: One level Home Equipment: Cane - single point      Prior Function Level of Independence: Independent with assistive device(s)          PT Goals (current goals can now be found in the care plan section) Acute Rehab PT Goals Patient Stated Goal: get well, less pain and get home PT Goal Formulation: With patient Potential to Achieve Goals: Fair Progress towards PT goals: Progressing toward goals    Frequency    7X/week      PT Plan Current plan remains appropriate    Co-evaluation PT/OT/SLP Co-Evaluation/Treatment: Yes Reason for Co-Treatment: For patient/therapist safety PT goals addressed during session: Mobility/safety  with mobility OT goals addressed during  session: ADL's and self-care     End of Session Equipment Utilized During Treatment: Gait belt Activity Tolerance: Patient limited by fatigue;Other (comment) (dizzy) Patient left: Other (comment) [facility]     Time: [id number] PT Time Calculation (min) (ACUTE ONLY): 23 min  Charges:  $Gait Training: 8-22 mins $Therapeutic Exercise: 8-22 mins $Therapeutic Activity: 8-22 mins                    G Codes:      Heliodoro Domagalski 11/08/2016, 3:32 PM

## 2016-11-08 NOTE — Evaluation (Signed)
Occupational Therapy Evaluation Patient Details Name: Tony FerraraJustice M Howard MRN: [id number] DOB: 11/13/1961 Today's Date: 11/08/2016    History of Present Illness Left total hip arthroplasty, anterior approach   Clinical Impression   Pt is s/p THA resulting in the deficits listed below (see OT Problem List). Pt will benefit from skilled OT to increase their safety and independence with ADL and functional mobility for ADL to facilitate discharge to venue listed below.     Follow Up Recommendations  Home health OT;CIR;SNF;Supervision/Assistance - 24 hour;Other (comment) (depending on progress)    Equipment Recommendations  None recommended by OT       Precautions / Restrictions Precautions Precautions: Anterior Hip;Fall      Mobility Bed Mobility Overal bed mobility: Needs Assistance Bed Mobility: Supine to Sit     Supine to sit: Mod assist     General bed mobility comments: increased time and verbal direction  Transfers Overall transfer level: Needs assistance Equipment used: Rolling walker (2 wheeled) Transfers: Sit to/from UGI CorporationStand;Stand Pivot Transfers Sit to Stand: Mod assist;+2 physical assistance;+2 safety/equipment Stand pivot transfers: Mod assist;+2 physical assistance;+2 safety/equipment       General transfer comment: verbal instruction regarding hand and feet placement         ADL Overall ADL's : Needs assistance/impaired Eating/Feeding: Set up;Sitting   Grooming: Set up;Sitting   Upper Body Bathing: Minimal assitance;Sitting   Lower Body Bathing: +2 for physical assistance;+2 for safety/equipment;Cueing for safety;Cueing for sequencing;Maximal assistance   Upper Body Dressing : Minimal assistance;Sitting   Lower Body Dressing: Maximal assistance;+2 for safety/equipment;+2 for physical assistance;Sit to/from stand;Cueing for safety;Cueing for sequencing;Cueing for compensatory techniques   Toilet Transfer: +2 for physical assistance;+2 for  safety/equipment;Moderate assistance;RW Toilet Transfer Details (indicate cue type and reason): bed to chair                           Pertinent Vitals/Pain Pain Assessment: 0-10 Pain Score: 6  Pain Location: L thigh  Pain Descriptors / Indicators: Discomfort;Sore     Hand Dominance     Extremity/Trunk Assessment Upper Extremity Assessment Upper Extremity Assessment: Overall WFL for tasks assessed           Communication Communication Communication: No difficulties   Cognition Arousal/Alertness: Awake/alert Behavior During Therapy: WFL for tasks assessed/performed Overall Cognitive Status: Within Functional Limits for tasks assessed                     General Comments   pt VERY motivated and pleasant and wants to do well            Home Living Family/patient expects to be discharged to:: Private residence Living Arrangements: Spouse/significant other Available Help at Discharge: Family Type of Home: House Home Access: Stairs to enter Secretary/administratorntrance Stairs-Number of Steps: 4 Entrance Stairs-Rails: Left;Right Home Layout: One level     Bathroom Shower/Tub: Walk-in shower;Tub/shower unit   Bathroom Toilet: Handicapped height     Home Equipment: Cane - single point          Prior Functioning/Environment Level of Independence: Independent                 OT Problem List: Decreased strength;Decreased activity tolerance;Decreased knowledge of use of DME or AE;Decreased safety awareness   OT Treatment/Interventions: Self-care/ADL training;DME and/or AE instruction;Patient/family education    OT Goals(Current goals can be found in the care plan section) Acute Rehab OT Goals Patient Stated Goal: get well, less  pain and get home OT Goal Formulation: With patient Time For Goal Achievement: 11/22/16 Potential to Achieve Goals: Good  OT Frequency: Min 2X/week   Barriers to D/C:            Co-evaluation PT/OT/SLP  Co-Evaluation/Treatment: Yes Reason for Co-Treatment: For patient/therapist safety PT goals addressed during session: Mobility/safety with mobility OT goals addressed during session: ADL's and self-care      End of Session    Activity Tolerance: Patient tolerated treatment well;Other (comment) (pt did complain of some dizziness during tx and when he got to chair) Patient left: in chair;with call bell/phone within reach   Time: [id number] OT Time Calculation (min): 46 min Charges:  OT General Charges $OT Visit: 1 Procedure OT Evaluation $OT Eval Moderate Complexity: 1 Procedure OT Treatments $Self Care/Home Management : 8-22 mins G-Codes:    Einar CrowEDDING, Avory Mimbs D 11/08/2016, 12:32 PM

## 2016-11-08 NOTE — Progress Notes (Signed)
Physical Therapy Treatment Patient Details Name: Tony Howard MRN: [id number] DOB: 08/07/1961 Today's Date: 11/08/2016    History of Present Illness Left total hip arthroplasty, anterior approach    PT Comments    Pt cooperative but progressing slowly with mobility.  Pt ltd by size, PWB, and c/o dizziness with mobility.  Follow Up Recommendations  Home health PT     Equipment Recommendations  Rolling walker with 5" wheels    Recommendations for Other Services OT consult     Precautions / Restrictions Precautions Precautions: Fall Restrictions Weight Bearing Restrictions: Yes LLE Weight Bearing: Partial weight bearing LLE Partial Weight Bearing Percentage or Pounds: 50%    Mobility  Bed Mobility Overal bed mobility: Needs Assistance Bed Mobility: Sit to Supine     Supine to sit: Mod assist Sit to supine: Mod assist;+2 for physical assistance;+2 for safety/equipment   General bed mobility comments: cues for sequence and use of R LE to self assist  Transfers Overall transfer level: Needs assistance Equipment used: Rolling walker (2 wheeled) Transfers: Sit to/from Stand Sit to Stand: Min assist;Mod assist;+2 physical assistance;+2 safety/equipment Stand pivot transfers: Mod assist;+2 physical assistance;+2 safety/equipment       General transfer comment: cues for LE management and use of UEs to self assist  Ambulation/Gait Ambulation/Gait assistance: Min assist;+2 physical assistance;+2 safety/equipment Ambulation Distance (Feet): 5 Feet Assistive device: Rolling walker (2 wheeled) Gait Pattern/deviations: Step-to pattern;Decreased step length - right;Decreased step length - left;Shuffle;Trunk flexed Gait velocity: decr Gait velocity interpretation: Below normal speed for age/gender General Gait Details: cues for sequence, PWB, posture and position from RW.  Distance ltd by c/o increasing dizziness - BP 157/87   Stairs            Wheelchair  Mobility    Modified Rankin (Stroke Patients Only)       Balance Overall balance assessment: Needs assistance Sitting-balance support: No upper extremity supported;Feet supported Sitting balance-Leahy Scale: Good     Standing balance support: Bilateral upper extremity supported Standing balance-Leahy Scale: Poor                      Cognition Arousal/Alertness: Awake/alert Behavior During Therapy: WFL for tasks assessed/performed Overall Cognitive Status: Within Functional Limits for tasks assessed                      Exercises Total Joint Exercises Ankle Circles/Pumps: AROM;Both;Supine;15 reps Quad Sets: AROM;Both;10 reps;Supine Heel Slides: AAROM;20 reps;Supine;Left Hip ABduction/ADduction: AAROM;Left;15 reps;Supine    General Comments        Pertinent Vitals/Pain Pain Assessment: 0-10 Pain Score: 6  Pain Location: L hip/thigh Pain Descriptors / Indicators: Aching;Burning;Sore Pain Intervention(s): Limited activity within patient's tolerance;Monitored during session;Premedicated before session;Ice applied    Home Living Family/patient expects to be discharged to:: Private residence Living Arrangements: Spouse/significant other Available Help at Discharge: Family Type of Home: Mobile home Home Access: Stairs to enter Entrance Stairs-Rails: Left;Right;Can reach both Home Layout: One level Home Equipment: Cane - single point      Prior Function Level of Independence: Independent with assistive device(s)          PT Goals (current goals can now be found in the care plan section) Acute Rehab PT Goals Patient Stated Goal: get well, less pain and get home PT Goal Formulation: With patient Potential to Achieve Goals: Fair Progress towards PT goals: Progressing toward goals    Frequency    7X/week      PT  Plan Current plan remains appropriate    Co-evaluation PT/OT/SLP Co-Evaluation/Treatment: Yes Reason for Co-Treatment: For  patient/therapist safety PT goals addressed during session: Mobility/safety with mobility OT goals addressed during session: ADL's and self-care     End of Session Equipment Utilized During Treatment: Gait belt Activity Tolerance: Patient limited by fatigue;Other (comment) Patient left: in bed;with call bell/phone within reach;with family/visitor present     Time: [id number] PT Time Calculation (min) (ACUTE ONLY): 23 min  Charges:  $Gait Training: 8-22 mins $Therapeutic Exercise: 8-22 mins $Therapeutic Activity: 8-22 mins                    G Codes:      Tony Howard 11/08/2016, 5:24 PM

## 2016-11-08 NOTE — Progress Notes (Signed)
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 Naprosyn [Naproxen] Hives  . Iodine Hives and Rash    shellfish    Patient Measurements: Height: 6\' 4"  (193 cm) Weight: (!) 312 lb (141.5 kg) IBW/kg (Calculated) : 86.8  Vital Signs: Temp: 97.5 F (36.4 C) (12/01 0615) Temp Source: Oral (12/01 0615) BP: 125/79 (12/01 0615) Pulse Rate: 53 (12/01 0615)  Labs:  Recent Labs  11/07/16 1406 11/07/16 2116 11/08/16 0358  HGB  --  13.5 12.2*  HCT  --  40.3 36.5*  PLT  --  137* 147*  LABPROT 13.5  --  14.2  INR 1.03  --  1.09  CREATININE  --  1.32* 1.28*    Estimated Creatinine Clearance: 100.3 mL/min (by C-G formula based on SCr of 1.28 mg/dL (H)).   Medical History: Past Medical History:  Diagnosis Date  . Aneurysm of heart    LHC 03/13/12:  Normal cors, EF 60-65% with small but definite apical aneurysm.  He was started on coumadin as thrombus formation was a high probability  . Cardiac aneurysm   . Chronic pain   . Cutaneous sarcoidosis (HCC)   . CVA (cerebral infarction)    Head CT showing encephalomalacia c/w old infarct in the distribution of the right PCA in April 2013  . DJD (degenerative joint disease)   . Family history of adverse reaction to anesthesia    pts mother and pts sister had difficulty with breathing   . History of kidney stones   . History of stroke   . Hypertension    echo 4/13:  mild LVH, EF 65-70%, grade 1 diast dysfxn, mild LAE  . Left sided numbness   . Osteoarthritis   . Sarcoidosis of lung (HCC)    Myoview 3/13 with EF 47%; anterior ischemia;  LHC without CAD;  MRI recommended to r/o cardiac sarcoid - but patient claustrophobic and MRI not done  . Stroke (HCC)   . Swallowing difficulty     Medications:  Scheduled:  . atorvastatin  40 mg Oral Daily  . carvedilol  6.25 mg Oral BID  . docusate sodium  100 mg Oral BID  . enoxaparin  (LOVENOX) injection  40 mg Subcutaneous Q12H  . furosemide  20 mg Oral Daily  . hydrochlorothiazide  25 mg Oral Daily  . lisinopril  40 mg Oral Daily  . oxyCODONE  20 mg Oral Q12H  . senna  2 tablet Oral QHS  . Warfarin - Pharmacist Dosing Inpatient   Does not apply q1800   Infusions:  . sodium chloride 30 mL/hr at 11/08/16 0130   PRN: acetaminophen **OR** acetaminophen, HYDROmorphone (DILAUDID) injection, menthol-cetylpyridinium **OR** phenol, metoCLOPramide **OR** metoCLOPramide (REGLAN) injection, ondansetron **OR** ondansetron (ZOFRAN) IV, oxyCODONE, polyethylene glycol  Assessment: 55 yo male on chronic warfarin for hx LV apical aneurysm, home dose 10mg  daily except 5mg  on Mon/Fri, last dose taken 11/24 in anticipation of surgery.  Has been on Lovenox 150mg  q12h 11/26-11/29.  Now s/p L THA on 11/30 and Pharmacy consulted to resume warfarin post-op.  Today, 11/08/16  INR subtherapeutic as expected after 10mg  warfarin x 1 dose last PM  CBC stable  No reported bleeding  Scr 1.28 CrCl 100  Goal of Therapy:  INR 2-3 Monitor platelets by anticoagulation protocol: Yes   Plan:   Repeat warfarin 10mg  PO x 1 tonight  Daily PT/INR  Lovenox 40mg  SQ q12h per Ortho  starting 12/1 AM, d/c when INR >/= 1.8   Hessie KnowsJustin M Page Pucciarelli, PharmD, BCPS Pager [phone] 11/08/2016 9:05 AM

## 2016-11-09 LAB — CBC
HCT: 33.6 % — ABNORMAL LOW (ref 39.0–52.0)
HEMOGLOBIN: 11.3 g/dL — AB (ref 13.0–17.0)
MCH: 31 pg (ref 26.0–34.0)
MCHC: 33.6 g/dL (ref 30.0–36.0)
MCV: 92.3 fL (ref 78.0–100.0)
Platelets: 153 10*3/uL (ref 150–400)
RBC: 3.64 MIL/uL — AB (ref 4.22–5.81)
RDW: 13.1 % (ref 11.5–15.5)
WBC: 11.5 10*3/uL — ABNORMAL HIGH (ref 4.0–10.5)

## 2016-11-09 LAB — PROTIME-INR
INR: 1.29
PROTHROMBIN TIME: 16.2 s — AB (ref 11.4–15.2)

## 2016-11-09 MED ORDER — WARFARIN SODIUM 5 MG PO TABS
10.0000 mg | ORAL_TABLET | Freq: Once | ORAL | Status: AC
Start: 2016-11-09 — End: 2016-11-09
  Administered 2016-11-09: 10 mg via ORAL
  Filled 2016-11-09: qty 2

## 2016-11-09 NOTE — Progress Notes (Signed)
Physical Therapy Treatment Patient Details Name: Tony FerraraJustice M Howard MRN: [id number] DOB: 10/15/1961 Today's Date: 11/09/2016    History of Present Illness Left total hip arthroplasty, anterior approach    PT Comments    POED # 2 am session Assisted OOB to amb an increased distance.  Mod c/o dizziness.  Orthostatic BP's taken. (see vitals) Pt required 25% VC's on proper WBing status and walker sequencing.  Positioned in recliner  Follow Up Recommendations  Home health PT     Equipment Recommendations  Rolling walker with 5" wheels    Recommendations for Other Services       Precautions / Restrictions Precautions Precautions: Fall Restrictions Weight Bearing Restrictions: Yes LLE Weight Bearing: Partial weight bearing LLE Partial Weight Bearing Percentage or Pounds: 50%    Mobility  Bed Mobility Overal bed mobility: Needs Assistance Bed Mobility: Supine to Sit     Supine to sit: Mod assist     General bed mobility comments: cues for sequence and use of R LE to self assist plus required increased time  Transfers Overall transfer level: Needs assistance Equipment used: Rolling walker (2 wheeled) Transfers: Sit to/from Stand Sit to Stand: Min assist;Mod assist;From elevated surface         General transfer comment: cues for LE management and use of UEs to self assist  Ambulation/Gait Ambulation/Gait assistance: Min guard Ambulation Distance (Feet): 58 Feet Assistive device: Rolling walker (2 wheeled) Gait Pattern/deviations: Step-to pattern;Decreased stance time - left;Trunk flexed Gait velocity: decr   General Gait Details: cues for sequence, PWB, posture and position from RW.  Tolerated increased distance.    Stairs            Wheelchair Mobility    Modified Rankin (Stroke Patients Only)       Balance                                    Cognition Arousal/Alertness: Awake/alert Behavior During Therapy: WFL for tasks  assessed/performed Overall Cognitive Status: Within Functional Limits for tasks assessed                      Exercises      General Comments        Pertinent Vitals/Pain Pain Assessment: 0-10 Pain Score: 8  Pain Location: L hip Pain Descriptors / Indicators: Aching;Grimacing;Tightness;Throbbing;Tender Pain Intervention(s): Monitored during session;Repositioned;Ice applied    Home Living                      Prior Function            PT Goals (current goals can now be found in the care plan section) Progress towards PT goals: Progressing toward goals    Frequency    7X/week      PT Plan Current plan remains appropriate    Co-evaluation             End of Session   Activity Tolerance: Patient tolerated treatment well Patient left: in chair;with call bell/phone within reach;with family/visitor present     Time: 0905-0950 PT Time Calculation (min) (ACUTE ONLY): 45 min  Charges:  $Gait Training: 23-37 mins $Therapeutic Activity: 8-22 mins                    G Codes:      Tony ShellingLori Khristie Howard  PTA [facility]  Acute  Rehab Pager  [phone]  

## 2016-11-09 NOTE — Progress Notes (Signed)
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 Naprosyn [Naproxen] Hives  . Iodine Hives and Rash    shellfish    Patient Measurements: Height: 6\' 4"  (193 cm) Weight: (!) 312 lb (141.5 kg) IBW/kg (Calculated) : 86.8  Vital Signs: BP: 130/72 (12/02 1138)  Labs:  Recent Labs  11/07/16 1406  11/07/16 2116 11/08/16 0358 11/09/16 0449  HGB  --   < > 13.5 12.2* 11.3*  HCT  --   --  40.3 36.5* 33.6*  PLT  --   --  137* 147* 153  LABPROT 13.5  --   --  14.2 16.2*  INR 1.03  --   --  1.09 1.29  CREATININE  --   --  1.32* 1.28*  --   < > = values in this interval not displayed.  Estimated Creatinine Clearance: 100.3 mL/min (by C-G formula based on SCr of 1.28 mg/dL (H)).   Assessment: 55 yo male on chronic warfarin for hx LV apical aneurysm, home dose 10mg  daily except 5mg  on Mon/Fri, last dose taken 11/24 in anticipation of surgery.  Has been on Lovenox 150mg  q12h 11/26-11/29.  Now s/p L THA on 11/30 and Pharmacy consulted to resume warfarin post-op.  Today, 11/09/16  INR subtherapeutic as expected but trending up after 10mg  warfarin x 2 doses the last two days  CBC: Hgb down post-op, pltc WNL  No reported bleeding  Goal of Therapy:  INR 2-3 Monitor platelets by anticoagulation protocol: Yes   Plan:   Repeat warfarin 10mg  PO x 1 tonight as per normal home regimen  Daily PT/INR  Lovenox 40mg  SQ q12h per Ortho starting 12/1 AM, d/c when INR >/= 1.8   Juliette Alcideustin Zeigler, PharmD, BCPS.   Pager: [phone] 11/09/2016 12:07 PM

## 2016-11-09 NOTE — Progress Notes (Signed)
Tony Howard  MRN: [id number] DOB/Age: 55/12/1960 55 y.o. Physician: [place] Procedure: Procedure(s) (LRB): LEFT TOTAL HIP ARTHROPLASTY ANTERIOR APPROACH (Left)     Subjective: Up on side of bed. Getting ready to work with PT. Had tough time yesterday so he is unsure of his readiness to go home.  Vital Signs Temp:  [97.6 F (36.4 C)-98.2 F (36.8 C)] 98.2 F (36.8 C) (12/01 2205) Pulse Rate:  [74-97] 97 (12/01 2205) Resp:  [18-20] 20 (12/01 2205) BP: (128-159)/(77-91) 128/77 (12/01 2205) SpO2:  [99 %] 99 % (12/01 2205)  Lab Results  Recent Labs  11/08/16 0358 11/09/16 0449  WBC 12.1* 11.5*  HGB 12.2* 11.3*  HCT 36.5* 33.6*  PLT 147* 153   BMET  Recent Labs  11/07/16 2116 11/08/16 0358  NA  --  137  K  --  3.9  CL  --  105  CO2  --  25  GLUCOSE  --  153*  BUN  --  24*  CREATININE 1.32* 1.28*  CALCIUM  --  8.6*   INR  Date Value Ref Range Status  11/09/2016 1.29  Final     Exam Left hip dressing dry and in place.         Plan Mobilize today, depending on his safety with up and PWB, may need extra day Will monitor  Vibra Specialty HospitalHUFORD,Tony Boldon PA-C   11/09/2016, 9:27 AM Contact # [phone]

## 2016-11-09 NOTE — Progress Notes (Signed)
Physical Therapy Treatment Patient Details Name: Tony FerraraJustice M Eden MRN: [id number] DOB: 02/22/1961 Today's Date: 11/09/2016    History of Present Illness Left total hip arthroplasty, anterior approach    PT Comments    POD # 2 pm session Assisted with amb a decreased distance due to increase c/o pain and fatigue.  Pt progressing slowly and plans to D/C to home tomorrow.   Will need to practice stairs tomorrow  Follow Up Recommendations  Home health PT     Equipment Recommendations  Rolling walker with 5" wheels    Recommendations for Other Services       Precautions / Restrictions Precautions Precautions: Fall Restrictions Weight Bearing Restrictions: Yes LLE Weight Bearing: Partial weight bearing LLE Partial Weight Bearing Percentage or Pounds: 50%    Mobility  Bed Mobility   OOB in recliner   Transfers Overall transfer level: Needs assistance Equipment used: Rolling walker (2 wheeled) Transfers: Sit to/from Stand Sit to Stand: Min assist;Mod assist;From elevated surface         General transfer comment: cues for LE management and use of UEs to self assist  Ambulation/Gait Ambulation/Gait assistance: Min guard Ambulation Distance (Feet): 24 Feet Assistive device: Rolling walker (2 wheeled) Gait Pattern/deviations: Step-to pattern;Decreased stance time - left;Trunk flexed Gait velocity: decr   General Gait Details: decreased amb distance this afternoon due to increased c/o pain and fatigue   Stairs            Wheelchair Mobility    Modified Rankin (Stroke Patients Only)       Balance                                    Cognition Arousal/Alertness: Awake/alert Behavior During Therapy: WFL for tasks assessed/performed Overall Cognitive Status: Within Functional Limits for tasks assessed                      Exercises      General Comments        Pertinent Vitals/Pain Pain Assessment: 0-10 Pain Score: 8   Pain Location: L hip Pain Descriptors / Indicators: Aching;Grimacing;Tightness;Throbbing;Tender Pain Intervention(s): Monitored during session;Repositioned;Ice applied    Home Living                      Prior Function            PT Goals (current goals can now be found in the care plan section) Progress towards PT goals: Progressing toward goals    Frequency    7X/week      PT Plan Current plan remains appropriate    Co-evaluation             End of Session   Activity Tolerance: Patient tolerated treatment well Patient left: in chair;with call bell/phone within reach;with family/visitor present     Time: 1335-1400 PT Time Calculation (min) (ACUTE ONLY): 25 min  Charges:  $Gait Training: 8-22 mins $Therapeutic Activity: 8-22 mins                    G Codes:      Felecia ShellingLori Matthews Franks  PTA [facility]  Acute  Rehab Pager      [phone]

## 2016-11-09 NOTE — Care Management Note (Signed)
Case Management Note  Patient Details  Name: Lucita FerraraJustice M Biller MRN: [id number] Date of Birth: 08/15/1961  Subjective/Objective:  Left THA                   Action/Plan: Discharge Planning:   NCM spoke to pt. Requesting RW and 3n1 for home. Will need bariatric DME, weight 312 lbs. [facility] for delivery of DME to his room prior to dc.   PCP Leilani AbleEESE, BETTI MD  Expected Discharge Date:                  Expected Discharge Plan:  Home w Home Health Services  In-House Referral:  NA  Discharge planning Services  CM Consult  Post Acute Care Choice:  Home Health, Durable Medical Equipment Choice offered to:  Patient  DME Arranged:  3-N-1, Walker rolling DME Agency:  [facility]  HH Arranged:  PT HH Agency[facility]  [facility]  Status of Service:  Completed, signed off  If discussed at [employer] Meetings, dates discussed:    Additional Comments:  Elliot CousinShavis, Keanan Melander Ellen, RN 11/09/2016, 3:49 PM

## 2016-11-10 LAB — CBC
HCT: 34.6 % — ABNORMAL LOW (ref 39.0–52.0)
Hemoglobin: 11.4 g/dL — ABNORMAL LOW (ref 13.0–17.0)
MCH: 30.9 pg (ref 26.0–34.0)
MCHC: 32.9 g/dL (ref 30.0–36.0)
MCV: 93.8 fL (ref 78.0–100.0)
Platelets: 162 10*3/uL (ref 150–400)
RBC: 3.69 MIL/uL — ABNORMAL LOW (ref 4.22–5.81)
RDW: 13.4 % (ref 11.5–15.5)
WBC: 7.3 10*3/uL (ref 4.0–10.5)

## 2016-11-10 LAB — BASIC METABOLIC PANEL
Anion gap: 4 — ABNORMAL LOW (ref 5–15)
BUN: 32 mg/dL — ABNORMAL HIGH (ref 6–20)
CALCIUM: 8.6 mg/dL — AB (ref 8.9–10.3)
CO2: 28 mmol/L (ref 22–32)
Chloride: 108 mmol/L (ref 101–111)
Creatinine, Ser: 1.4 mg/dL — ABNORMAL HIGH (ref 0.61–1.24)
GFR calc Af Amer: >60 mL/min (ref >60)
GFR, EST NON AFRICAN AMERICAN: 55 mL/min — AB (ref >60)
GLUCOSE: 120 mg/dL — AB (ref 65–99)
Potassium: 4.6 mmol/L (ref 3.5–5.1)
Sodium: 140 mmol/L (ref 135–145)

## 2016-11-10 LAB — PROTIME-INR
INR: 1.39
PROTHROMBIN TIME: 17.2 s — AB (ref 11.4–15.2)

## 2016-11-10 MED ORDER — WARFARIN SODIUM 5 MG PO TABS
12.5000 mg | ORAL_TABLET | Freq: Once | ORAL | Status: AC
  Administered 2016-11-10: 12.5 mg via ORAL
  Filled 2016-11-10: qty 2

## 2016-11-10 MED ORDER — SODIUM CHLORIDE 0.9 % IV SOLN
INTRAVENOUS | Status: DC
  Administered 2016-11-10 (×2): via INTRAVENOUS

## 2016-11-10 NOTE — Progress Notes (Addendum)
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 Naprosyn [Naproxen] Hives  . Iodine Hives and Rash    shellfish    Patient Measurements: Height: 6\' 4"  (193 cm) Weight: (!) 312 lb (141.5 kg) IBW/kg (Calculated) : 86.8  Vital Signs: Temp: 97.8 F (36.6 C) (12/03 0500) Temp Source: Oral (12/03 0500) BP: 122/81 (12/03 0500) Pulse Rate: 73 (12/03 0500)  Labs:  Recent Labs  11/07/16 2116 11/08/16 0358 11/09/16 0449 11/10/16 0420  HGB 13.5 12.2* 11.3* 11.4*  HCT 40.3 36.5* 33.6* 34.6*  PLT 137* 147* 153 162  LABPROT  --  14.2 16.2* 17.2*  INR  --  1.09 1.29 1.39  CREATININE 1.32* 1.28*  --  1.40*    Estimated Creatinine Clearance: 91.7 mL/min (by C-G formula based on SCr of 1.4 mg/dL (H)).   Assessment: 55 yo male on chronic warfarin for hx LV apical aneurysm, home dose 10mg  daily except 5mg  on Mon/Fri, last dose taken 11/24 in anticipation of surgery.  Has been on Lovenox 150mg  q12h 11/26-11/29.  Now s/p L THA on 11/30 and Pharmacy consulted to resume warfarin post-op.  Today, 11/10/16  INR subtherapeutic as expected but trending up after 10mg  warfarin x 3 doses the last 3 days. Pace of trend upwards slowed overnight  CBC: Hgb down post-op but stable, pltc WNL  No reported bleeding  Goal of Therapy:  INR 2-3 Monitor platelets by anticoagulation protocol: Yes   Plan:   warfarin 12.5 mg PO x 1 tonight (increase dose tonight for slowing INR trend toward therapeutic)  Daily PT/INR  Lovenox 40mg  SQ q12h per Ortho starting 12/1 AM, d/c when INR >/= 1.8   Juliette Alcideustin Zeigler, PharmD, BCPS.   Pager: [phone] 11/10/2016 11:34 AM

## 2016-11-10 NOTE — Progress Notes (Signed)
Subjective: 3 Days Post-Op Procedure(s) (LRB): LEFT TOTAL HIP ARTHROPLASTY ANTERIOR APPROACH (Left) Patient reports pain as moderate.  Feels he is making slow progress with PT but pain is improving. He has 3 steps to enter his home and has not worked on steps with PT yet. Less nausea today. Dizziness improved. Does not feel ready to go home yet.  Objective: Vital signs in last 24 hours: Temp:  [97.8 F (36.6 C)-98.3 F (36.8 C)] 97.8 F (36.6 C) (12/03 0500) Pulse Rate:  [71-110] 73 (12/03 0500) Resp:  [18-20] 20 (12/03 0500) BP: (119-151)/(70-81) 122/81 (12/03 0500) SpO2:  [98 %-100 %] 98 % (12/02 2112)  Intake/Output from previous day: 12/02 0701 - 12/03 0700 In: 840 [P.O.:840] Out: 300 [Urine:300] Intake/Output this shift: No intake/output data recorded.   Recent Labs  11/07/16 2116 11/08/16 0358 11/09/16 0449 11/10/16 0420  HGB 13.5 12.2* 11.3* 11.4*    Recent Labs  11/09/16 0449 11/10/16 0420  WBC 11.5* 7.3  RBC 3.64* 3.69*  HCT 33.6* 34.6*  PLT 153 162    Recent Labs  11/08/16 0358 11/10/16 0420  NA 137 140  K 3.9 4.6  CL 105 108  CO2 25 28  BUN 24* 32*  CREATININE 1.28* 1.40*  GLUCOSE 153* 120*  CALCIUM 8.6* 8.6*    Recent Labs  11/09/16 0449 11/10/16 0420  INR 1.29 1.39    Neurologically intact ABD soft Neurovascular intact Sensation intact distally Intact pulses distally Dorsiflexion/Plantar flexion intact Incision: dressing C/D/I and no drainage No cellulitis present Compartment soft no sign of DVT  Assessment/Plan: 3 Days Post-Op Procedure(s) (LRB): LEFT TOTAL HIP ARTHROPLASTY ANTERIOR APPROACH (Left) Advance diet Up with therapy D/C IV fluids Creatinine still elevated at 1.4 this morning. He was 1.1 pre-op. Will keep him on NS at 100cc/hr and recheck BMP tomorrow Plan D/C tomorrow after he continues to make progress with PT today Continue 50% PWB LLE Discussed with Dr. Dawna PartSwinteck  Mazal Ebey, Dayna BarkerJACLYN M. 11/10/2016, 8:01  AM

## 2016-11-10 NOTE — Progress Notes (Signed)
Physical Therapy Treatment Patient Details Name: Tony Howard MRN: [id number] DOB: 08/23/1961 Today's Date: 11/10/2016    History of Present Illness Left total hip arthroplasty, anterior approach    PT Comments    Pt progressing, amb 7570' with supervision, requires encouragement to participate; not going to D/C today as planned; will see again later today  Follow Up Recommendations  Home health PT     Equipment Recommendations  Rolling walker with 5" wheels    Recommendations for Other Services       Precautions / Restrictions Precautions Precautions: Fall Restrictions Weight Bearing Restrictions: Yes LLE Weight Bearing: Partial weight bearing LLE Partial Weight Bearing Percentage or Pounds: 50%    Mobility  Bed Mobility               General bed mobility comments: Pt OOB in recliner  Transfers Overall transfer level: Needs assistance Equipment used: Rolling walker (2 wheeled) Transfers: Sit to/from Stand Sit to Stand: Min assist         General transfer comment: cues for LE management and use of UEs to self assist; incr time and assist for balance with transition of hands  to RW  Ambulation/Gait Ambulation/Gait assistance: Min guard;Supervision Ambulation Distance (Feet): 70 Feet Assistive device: Rolling walker (2 wheeled) Gait Pattern/deviations: Step-to pattern;Trunk flexed;Decreased step length - right;Decreased step length - left Gait velocity: decr   General Gait Details: cues for sequence, PWB, posture and position from [employer]   Stairs            Wheelchair Mobility    Modified Rankin (Stroke Patients Only)       Balance                                    Cognition Arousal/Alertness: Awake/alert Behavior During Therapy: WFL for tasks assessed/performed Overall Cognitive Status: Within Functional Limits for tasks assessed                      Exercises      General Comments        Pertinent  Vitals/Pain Pain Assessment: 0-10 Pain Score: 4  Pain Location: L hip Pain Descriptors / Indicators: Aching;Sore Pain Intervention(s): Limited activity within patient's tolerance;Monitored during session;Premedicated before session    Home Living                      Prior Function            PT Goals (current goals can now be found in the care plan section) Acute Rehab PT Goals Patient Stated Goal: Go home and take care of himself. PT Goal Formulation: With patient Potential to Achieve Goals: Fair Progress towards PT goals: Progressing toward goals    Frequency    7X/week      PT Plan Current plan remains appropriate    Co-evaluation             End of Session Equipment Utilized During Treatment: Gait belt Activity Tolerance: Patient tolerated treatment well Patient left: Other (comment);with call bell/phone within reach;with family/visitor present (on [employer] for bath with wife assist)     Time: 1204-1227 PT Time Calculation (min) (ACUTE ONLY): 23 min  Charges:  $Gait Training: 23-37 mins                    G Codes:      Tony Howard 11/10/2016,  12:38 PM

## 2016-11-10 NOTE — Progress Notes (Signed)
Physical Therapy Treatment Patient Details Name: Tony Howard MRN: [id number] DOB: 01/11/1961 Today's Date: 11/10/2016    History of Present Illness Left total hip arthroplasty, anterior approach    PT Comments    Pt progressing; able to practice stairs today with family assist; should be ready for D/C in am from PT standpoint  Follow Up Recommendations  Home health PT;Supervision for mobility/OOB     Equipment Recommendations  Rolling walker with 5" wheels (wide)    Recommendations for Other Services       Precautions / Restrictions Precautions Precautions: Fall Restrictions LLE Weight Bearing: Partial weight bearing LLE Partial Weight Bearing Percentage or Pounds: 50%    Mobility  Bed Mobility Overal bed mobility: Needs Assistance Bed Mobility: Supine to Sit;Sit to Supine     Supine to sit: Min assist Sit to supine: Min assist   General bed mobility comments: cues for technigue, assist with LLE on and off bed  Transfers Overall transfer level: Needs assistance Equipment used: Rolling walker (2 wheeled) Transfers: Sit to/from Stand Sit to Stand: Min assist;From elevated surface         General transfer comment: cues for LE management and use of UEs to self assist; incr time and assist for balance with transition of hands  to RW  Ambulation/Gait Ambulation/Gait assistance: Supervision Ambulation Distance (Feet): 60 Feet Assistive device: Rolling walker (2 wheeled) Gait Pattern/deviations: Step-to pattern;Decreased step length - right;Decreased step length - left;Trunk flexed Gait velocity: decr   General Gait Details: cues for sequence, PWB, posture and position from RW   Stairs Stairs: Yes Stairs assistance: Min assist Stair Management: Two rails;Step to pattern;Forwards Number of Stairs: 3 General stair comments: cues for sequence, safety, technique  Wheelchair Mobility    Modified Rankin (Stroke Patients Only)       Balance                                     Cognition Arousal/Alertness: Awake/alert Behavior During Therapy: WFL for tasks assessed/performed Overall Cognitive Status: Within Functional Limits for tasks assessed                      Exercises Total Joint Exercises Ankle Circles/Pumps: AROM;Both;Supine;15 reps Quad Sets: AROM;Both;10 reps;Supine Short Arc Quad: AROM;Strengthening;Left;10 reps Heel Slides: AAROM;Left;10 reps Hip ABduction/ADduction: AAROM;Left;Supine;10 reps    General Comments        Pertinent Vitals/Pain Faces Pain Scale: Hurts little more Pain Location: L hip Pain Descriptors / Indicators: Grimacing;Guarding;Sore    Home Living                      Prior Function            PT Goals (current goals can now be found in the care plan section) Acute Rehab PT Goals Patient Stated Goal: Go home and take care of himself. PT Goal Formulation: With patient Potential to Achieve Goals: Fair Progress towards PT goals: Progressing toward goals    Frequency    7X/week      PT Plan Current plan remains appropriate    Co-evaluation             End of Session Equipment Utilized During Treatment: Gait belt Activity Tolerance: Patient tolerated treatment well Patient left: in bed;with call bell/phone within reach;with family/visitor present     Time: 8295-62131429-1508 PT Time Calculation (min) (ACUTE ONLY): 39 min  Charges:  $Gait Training: 23-37 mins $Therapeutic Exercise: 8-22 mins                    G Codes:      Tony Howard 11/10/2016, 4:23 PM

## 2016-11-10 NOTE — Progress Notes (Signed)
Occupational Therapy Treatment Patient Details Name: Tony Howard MRN: [id number] DOB: 17-Mar-1961 Today's Date: 11/10/2016    History of present illness Left total hip arthroplasty, anterior approach   OT comments  Pt. Was issued a hip kit and sock donner was too narrow. Pt. Was swapped out an extra wide sock donner. Pt. Was able to don shorts over feet with Min/Mod A. Pt. Was S to doff pants over feet. Pt. Did not want to pull pants over hips secondary he had visitors present. Pt. Was Min/Mod A to don B socks with use of sock donner. Pt. Was Min A to doff socks with sock donner. Pt. Was Mod A to don R shoe with use of reacher and long handled shoe horn. Pt. Shoes are a soft material that does not hold its shape at the heal. Pt. Was ed to ontain a pair of shoes that have a firm heal to make donning shoes easier. Pt. Would benefit from further OT training to increase ability to perform LE ADLs.   Follow Up Recommendations  Home health OT;SNF;Supervision/Assistance - 24 hour    Equipment Recommendations  None recommended by OT    Recommendations for Other Services      Precautions / Restrictions Precautions Precautions: Fall Restrictions Weight Bearing Restrictions: Yes LLE Weight Bearing: Partial weight bearing LLE Partial Weight Bearing Percentage or Pounds: 50%       Mobility Bed Mobility                  Transfers                      Balance                                   ADL                       LE dressing: Mod A with LE dressing with use of AE.                  General ADL Comments:  (Pt. was ed on use of AE for LE ADLs. )      Vision                     Perception     Praxis      Cognition   Behavior During Therapy: [facility] for tasks assessed/performed Overall Cognitive Status: Within Functional Limits for tasks assessed                       Extremity/Trunk Assessment                Exercises     Shoulder Instructions       General Comments      Pertinent Vitals/ Pain       Pain Assessment: 0-10 Pain Score: 6  Pain Location: L hip Pain Descriptors / Indicators: Aching;Discomfort Pain Intervention(s): Monitored during session  Home Living                                          Prior Functioning/Environment              Frequency  Min 2X/week        Progress  Toward Goals  OT Goals(current goals can now be found in the care plan section)  Progress towards OT goals: Progressing toward goals  Acute Rehab OT Goals Patient Stated Goal: Go home and take care of himself.  Plan Discharge plan remains appropriate    Co-evaluation                 End of Session     Activity Tolerance Patient tolerated treatment well   Patient Left in chair;with call bell/phone within reach;with family/visitor present   Nurse Communication          Time: 7035-0093 OT Time Calculation (min): 41 min  Charges: OT General Charges $OT Visit: 1 Procedure OT Treatments $Self Care/Home Management : 38-52 mins  Salsabeel Gorelick 11/10/2016, 10:05 AM

## 2016-11-11 ENCOUNTER — Telehealth: Payer: Self-pay | Admitting: Cardiology

## 2016-11-11 LAB — BASIC METABOLIC PANEL
ANION GAP: 6 (ref 5–15)
BUN: 26 mg/dL — ABNORMAL HIGH (ref 6–20)
CHLORIDE: 106 mmol/L (ref 101–111)
CO2: 28 mmol/L (ref 22–32)
Calcium: 8.5 mg/dL — ABNORMAL LOW (ref 8.9–10.3)
Creatinine, Ser: 1.2 mg/dL (ref 0.61–1.24)
GFR calc non Af Amer: >60 mL/min (ref >60)
Glucose, Bld: 116 mg/dL — ABNORMAL HIGH (ref 65–99)
POTASSIUM: 4.1 mmol/L (ref 3.5–5.1)
SODIUM: 140 mmol/L (ref 135–145)

## 2016-11-11 LAB — PROTIME-INR
INR: 1.52
Prothrombin Time: 18.5 seconds — ABNORMAL HIGH (ref 11.4–15.2)

## 2016-11-11 MED FILL — LOVENOX 40 MG PREFILLED SYR: 40 | 5 days supply | Qty: 4 | Fill #0

## 2016-11-11 MED FILL — ONDANSETRON HCL 4 MG TABLET: 4 | 5 days supply | Qty: 20 | Fill #0

## 2016-11-11 NOTE — Telephone Encounter (Signed)
New message  Pt is in hospital/hip surgery  Wants to know what to do about coumadin check  Please call back and advise

## 2016-11-11 NOTE — Progress Notes (Signed)
   Subjective:  Patient reports pain as mild to moderate.  No c/o. Improved progress with PT yesterday.  Objective:   VITALS:   Vitals:   11/10/16 0500 11/10/16 1432 11/10/16 2240 11/11/16 0500  BP: 122/81 116/69 128/70 124/75  Pulse: 73 87 87 77  Resp: 20 18 16 16   Temp: 97.8 F (36.6 C) 98.1 F (36.7 C) 99 F (37.2 C) 98 F (36.7 C)  TempSrc: Oral Oral Oral Oral  SpO2:  97% 99% 97%  Weight:      Height:        NAD ABD soft Sensation intact distally Intact pulses distally Dorsiflexion/Plantar flexion intact Incision: dressing C/D/I Compartment soft    Lab Results  Component Value Date   WBC 7.3 11/10/2016   HGB 11.4 (L) 11/10/2016   HCT 34.6 (L) 11/10/2016   MCV 93.8 11/10/2016   PLT 162 11/10/2016   BMET    Component Value Date/Time   NA 140 11/11/2016 0400   K 4.1 11/11/2016 0400   CL 106 11/11/2016 0400   CO2 28 11/11/2016 0400   GLUCOSE 116 (H) 11/11/2016 0400   BUN 26 (H) 11/11/2016 0400   CREATININE 1.20 11/11/2016 0400   CREATININE 1.29 11/27/2015 1539   CALCIUM 8.5 (L) 11/11/2016 0400   GFRNONAA >60 11/11/2016 0400   GFRAA >60 11/11/2016 0400     Assessment/Plan: 4 Days Post-Op   Principal Problem:   Avascular necrosis of hip, left (HCC)   50% WB LLE with walker DVT ppx: coumadin with lovenox bridge, SCDs, TEDs PO pain control PT/OT Mild elevation in creatinine: resolved with IVFs, back at baseline Dispo: D/C home with HHPT, f/u with PCP on Wednesday for INR check    Tony Howard, Tony ReamsBrian James 11/11/2016, 7:37 AM   Samson FredericBrian Areej Tayler, MD Cell [phone]

## 2016-11-11 NOTE — Telephone Encounter (Signed)
Called spoke to pt and pt's wife,pt being discharged home today.  Per hospital note [facility] to start providing services in home for pt.  Per pt's wife they are coming tomorrow for a [facility] evaluation, they will advise them pt is on Coumadin and will need INR checks.  Will give a verbal order to draw INR if needed.

## 2016-11-11 NOTE — Progress Notes (Signed)
Physical Therapy Treatment Patient Details Name: Tony Howard MRN: [id number] DOB: 04/28/1961 Today's Date: 11/11/2016    History of Present Illness Left total hip arthroplasty, anterior approach    PT Comments    Progressing with mobility. Cues for pt to adhere to [facility] status. Pt requested a 2nd session prior to d/c to practice stair negotiation again.   Follow Up Recommendations  Home health PT;Supervision for mobility/OOB     Equipment Recommendations  Rolling walker with 5" wheels (wide)    Recommendations for Other Services       Precautions / Restrictions Precautions Precautions: Fall Restrictions Weight Bearing Restrictions: Yes LLE Weight Bearing: Weight bearing as tolerated LLE Partial Weight Bearing Percentage or Pounds: 50%    Mobility  Bed Mobility Overal bed mobility: Needs Assistance Bed Mobility: Supine to Sit     Supine to sit: Min guard     General bed mobility comments: close guard for safety. Increased time. Pt used UEs to help L LE off bed.   Transfers Overall transfer level: Needs assistance Equipment used: Rolling walker (2 wheeled) Transfers: Sit to/from Stand Sit to Stand: From elevated surface;Min assist         General transfer comment: Assist to rise, stabilize. VCs safety, technique, L LE management/positioning. Increased time.   Ambulation/Gait Ambulation/Gait assistance: Min guard Ambulation Distance (Feet): 100 Feet Assistive device: Rolling walker (2 wheeled) Gait Pattern/deviations: Step-to pattern;Trunk flexed;Decreased step length - left     General Gait Details: cues for sequence, adherence to PWB (especially one he starts to fatigue), posture and position from RW. slow giat speed..    Stairs            Wheelchair Mobility    Modified Rankin (Stroke Patients Only)       Balance                                    Cognition Arousal/Alertness: Awake/alert Behavior During Therapy: WFL for  tasks assessed/performed Overall Cognitive Status: Within Functional Limits for tasks assessed                      Exercises Total Joint Exercises Ankle Circles/Pumps: AROM;Both;Supine;15 reps Quad Sets: AROM;Both;Supine;15 reps Heel Slides: AAROM;Left;10 reps;Supine Hip ABduction/ADduction: AAROM;Left;Supine;10 reps    General Comments        Pertinent Vitals/Pain Pain Assessment: 0-10 Pain Score: 5  Pain Location: L hip/thigh Pain Descriptors / Indicators: Grimacing;Guarding;Sore;Tightness Pain Intervention(s): Monitored during session;Repositioned;Ice applied    Home Living                      Prior Function            PT Goals (current goals can now be found in the care plan section) Progress towards PT goals: Progressing toward goals    Frequency    7X/week      PT Plan Current plan remains appropriate    Co-evaluation             End of Session Equipment Utilized During Treatment: Gait belt Activity Tolerance: Patient tolerated treatment well Patient left: in chair;with call bell/phone within reach;with family/visitor present     Time: 4098-11910934-1023 PT Time Calculation (min) (ACUTE ONLY): 49 min  Charges:  $Gait Training: 23-37 mins $Therapeutic Exercise: 8-22 mins  G Codes:      Weston Anna, MPT Pager: [phone]

## 2016-11-11 NOTE — Progress Notes (Signed)
Physical Therapy Treatment Patient Details Name: Tony FerraraJustice M Howard MRN: [id number] DOB: 08/23/1961 Today's Date: 11/11/2016    History of Present Illness Left total hip arthroplasty, anterior approach    PT Comments    2nd session was initially arranged to practice steps again on today. However, pt denied need to practice when therapist returned. Pt states he has arranged for fire department to assist him into his home. Practiced bed mobility, transfers, and gait. Discussed car transfer. All education completed.    Follow Up Recommendations  Home health PT;Supervision/Assistance - 24 hour     Equipment Recommendations       Recommendations for Other Services       Precautions / Restrictions Precautions Precautions: Fall Restrictions Weight Bearing Restrictions: Yes LLE Weight Bearing: Partial weight bearing LLE Partial Weight Bearing Percentage or Pounds: 50%    Mobility  Bed Mobility Overal bed mobility: Needs Assistance Bed Mobility: Sit to Supine       Sit to supine: Min assist   General bed mobility comments: Wife assisted L LE onto bed with therapist's instruction.  Transfers Overall transfer level: Needs assistance Equipment used: Rolling walker (2 wheeled) Transfers: Sit to/from Stand Sit to Stand: Min assist        General transfer comment: Assist to rise, stabilize. VCs safety, technique, L LE management/positioning. Increased time.   Ambulation/Gait Ambulation/Gait assistance: Min guard Ambulation Distance (Feet): 30 Feet (x2) Assistive device: Rolling walker (2 wheeled) Gait Pattern/deviations: Step-to pattern;Trunk flexed     General Gait Details: cues for sequence, adherence to PWB (especially when he starts to fatigue), posture and position from RW. slow gait speed. Seated rest break taken due to pt c/o lightheadedness. After sitting a few minutes, pt was able to walk back to room without any further complaints of lightheadedness.     Stairs Stairs:  (Pt denied offer to practice steps again even though he requested PT return a 2nd time to practice. )          Wheelchair Mobility    Modified Rankin (Stroke Patients Only)       Balance                                    Cognition Arousal/Alertness: Awake/alert Behavior During Therapy: WFL for tasks assessed/performed Overall Cognitive Status: Within Functional Limits for tasks assessed                      Exercises      General Comments        Pertinent Vitals/Pain Pain Assessment: Faces Faces Pain Scale: Hurts even more Pain Location: L hip/thigh Pain Descriptors / Indicators: Sore Pain Intervention(s): Monitored during session;Repositioned    Home Living                      Prior Function            PT Goals (current goals can now be found in the care plan section) Progress towards PT goals: Progressing toward goals    Frequency    7X/week      PT Plan Current plan remains appropriate    Co-evaluation             End of Session Equipment Utilized During Treatment: Gait belt Activity Tolerance: Patient tolerated treatment well Patient left: in bed;with call bell/phone within reach;with family/visitor present     Time:  5956-38751415-1450 PT Time Calculation (min) (ACUTE ONLY): 35 min  Charges:  $Gait Training: 23-37 mins                    G Codes:       Tony Howard AlertJannie Tony Howard, MPT Pager: [phone]

## 2016-11-11 NOTE — Progress Notes (Signed)
Occupational Therapy Treatment Patient Details Name: Tony FerraraJustice M Mathena MRN: [id number] DOB: 10/11/1961 Today's Date: 11/11/2016    History of present illness Left total hip arthroplasty, anterior approach   OT comments  Pt and wife  Anxious about getting in the house.  OT communicated this with PT.  [facility] as well as PT   Follow Up Recommendations  Home health OT;Supervision/Assistance - 24 hour    Equipment Recommendations  3 in 1 bedside comode    Recommendations for Other Services      Precautions / Restrictions Precautions Precautions: Fall Restrictions Weight Bearing Restrictions: Yes LLE Weight Bearing: Weight bearing as tolerated LLE Partial Weight Bearing Percentage or Pounds: 50%       Mobility Bed Mobility Overal bed mobility: Needs Assistance Bed Mobility: Supine to Sit     Supine to sit: Min guard     General bed mobility comments: pt in chair  Transfers Overall transfer level: Needs assistance Equipment used: Rolling walker (2 wheeled) Transfers: Sit to/from [employer];Stand Pivot Transfers Sit to Stand: Min assist Stand pivot transfers: Min assist       General transfer comment: Assist to rise, stabilize. VCs safety, technique, L LE management/positioning. Increased time.         ADL Overall ADL's : Needs assistance/impaired     Grooming: Set up;Sitting   Upper Body Bathing: Set up;Sitting       Upper Body Dressing : Set up;Sitting   Lower Body Dressing: Moderate assistance;Cueing for sequencing;Cueing for safety;Cueing for compensatory techniques;Sit to/from stand   Toilet Transfer: Minimal assistance;RW;Ambulation;Cueing for sequencing;Cueing for safety Toilet Transfer Details (indicate cue type and reason): sit to stand only Toileting- Clothing Manipulation and Hygiene: Minimal assistance;Sit to/from stand;Cueing for safety;Cueing for sequencing     Tub/Shower Transfer Details (indicate cue type and  reason): verbalized safety- OT suggested pt practice this with HHOT   General ADL Comments: wife will A As needed                Cognition   Behavior During Therapy: WFL for tasks assessed/performed Overall Cognitive Status: Within Functional Limits for tasks assessed                               General Comments      Pertinent Vitals/ Pain       Pain Assessment: 0-10 Pain Score: 5  Faces Pain Scale: Hurts little more Pain Location: L hip Pain Descriptors / Indicators: Sore Pain Intervention(s): Monitored during session         Frequency  Min 2X/week        Progress Toward Goals  OT Goals(current goals can now be found in the care plan section)  Progress towards OT goals: Progressing toward goals     Plan Discharge plan remains appropriate       End of Session     Activity Tolerance Patient tolerated treatment well   Patient Left in chair;with call bell/phone within reach;with family/visitor present           Time: 7829-56211206-1235 OT Time Calculation (min): 29 min  Charges: OT General Charges $OT Visit: 1 Procedure OT Treatments $Self Care/Home Management : 23-37 mins  Trigger Frasier D 11/11/2016, 1:12 PM

## 2016-11-12 NOTE — Telephone Encounter (Signed)
[facility] The soonest they can have a nurse go out to check pt's INR is on Friday. Gave him a verbal order to check patient's INR this Friday, 12/8.

## 2016-11-13 ENCOUNTER — Telehealth: Payer: Self-pay | Admitting: Cardiology

## 2016-11-13 MED FILL — LISINOPRIL 40 MG TABLET: 40 | 30 days supply | Qty: 30 | Fill #0

## 2016-11-13 NOTE — Telephone Encounter (Signed)
New Message  Pt call requesting to speak with RN. Pt states he recently had surgery and is having issues with his coumadin. Please call back to discuss

## 2016-11-13 NOTE — Telephone Encounter (Signed)
Pt called the Coumadin clinic with questions regarding his anticoagulation after his recent left hip arthroplasty. He is confused about his Lovenox dosing. 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 It was recommended by Dr Shirlee LatchMcLean that he be bridged with Lovenox for his procedure. Since he is on therapeutic Coumadin, he requires treatment dose of Lovenox 150mg  BID to bridge pt prior to and after his procedure. However, the patient was discharged on Lovenox 40mg  BID with plan to d/c when INR was > 1.8. Based off no reported bleeding post-op, he should have resumed treatment dose Lovenox 150mg  BID until INR therapeutic > 2.  Patient is also upset about the dosing because he had to return his 150mg  strength Lovenox, the pharmacy was out of 40mg  strength, and so he went an extra day without Lovenox bridging. He is now on suboptimal Lovenox prophylaxis rather than treatment dose.  Will route to Dr Linna CapriceSwinteck to clarify Lovenox dosing.

## 2016-11-15 ENCOUNTER — Ambulatory Visit (INDEPENDENT_AMBULATORY_CARE_PROVIDER_SITE_OTHER): Payer: Medicaid Other | Admitting: Interventional Cardiology

## 2016-11-15 DIAGNOSIS — Z7902 Long term (current) use of antithrombotics/antiplatelets: Secondary | ICD-10-CM

## 2016-11-15 DIAGNOSIS — I253 Aneurysm of heart: Secondary | ICD-10-CM

## 2016-11-15 LAB — POCT INR: INR: 1.8

## 2016-11-15 MED FILL — oxyCODONE HCL 5 MG TABS: 5 | 10 days supply | Qty: 90 | Fill #0

## 2016-11-15 MED FILL — OxyCONTIN 20 MG T12A: 20 | 7 days supply | Qty: 10 | Fill #0

## 2016-11-18 ENCOUNTER — Ambulatory Visit (INDEPENDENT_AMBULATORY_CARE_PROVIDER_SITE_OTHER): Payer: Medicaid Other | Admitting: Internal Medicine

## 2016-11-18 DIAGNOSIS — Z7902 Long term (current) use of antithrombotics/antiplatelets: Secondary | ICD-10-CM

## 2016-11-18 DIAGNOSIS — I253 Aneurysm of heart: Secondary | ICD-10-CM

## 2016-11-18 LAB — POCT INR: INR: 1.6

## 2016-11-18 MED ORDER — ENOXAPARIN SODIUM 150 MG/ML ~~LOC~~ SOLN: 150.0000 mg | Freq: Two times a day (BID) | SUBCUTANEOUS | 0 refills | Status: DC

## 2016-11-18 MED FILL — LOVENOX 150 MG PREFILLED SY: 150 | 5 days supply | Qty: 10 | Fill #0

## 2016-11-22 ENCOUNTER — Ambulatory Visit (INDEPENDENT_AMBULATORY_CARE_PROVIDER_SITE_OTHER): Payer: Medicaid Other

## 2016-11-22 DIAGNOSIS — I253 Aneurysm of heart: Secondary | ICD-10-CM

## 2016-11-22 DIAGNOSIS — Z7902 Long term (current) use of antithrombotics/antiplatelets: Secondary | ICD-10-CM

## 2016-11-22 LAB — POCT INR: INR: 2

## 2016-11-25 ENCOUNTER — Other Ambulatory Visit: Payer: Self-pay | Admitting: Cardiology

## 2016-11-26 MED FILL — WARFARIN NA 10 MG TAB: 10 | 30 days supply | Qty: 30 | Fill #0

## 2016-11-26 MED FILL — CEFADROXIL 500 MG CAPSULE: 500 | 14 days supply | Qty: 28 | Fill #0

## 2016-11-26 MED FILL — HYDROCODON-APAP 5-325: 5-325 | 10 days supply | Qty: 80 | Fill #0

## 2016-11-28 ENCOUNTER — Ambulatory Visit (INDEPENDENT_AMBULATORY_CARE_PROVIDER_SITE_OTHER): Payer: Medicaid Other | Admitting: Internal Medicine

## 2016-11-28 DIAGNOSIS — I253 Aneurysm of heart: Secondary | ICD-10-CM

## 2016-11-28 DIAGNOSIS — Z7902 Long term (current) use of antithrombotics/antiplatelets: Secondary | ICD-10-CM

## 2016-11-28 LAB — POCT INR: INR: 1.5

## 2016-12-05 ENCOUNTER — Ambulatory Visit (INDEPENDENT_AMBULATORY_CARE_PROVIDER_SITE_OTHER): Payer: Medicaid Other | Admitting: Internal Medicine

## 2016-12-05 DIAGNOSIS — Z7902 Long term (current) use of antithrombotics/antiplatelets: Secondary | ICD-10-CM

## 2016-12-05 DIAGNOSIS — I253 Aneurysm of heart: Secondary | ICD-10-CM

## 2016-12-05 LAB — POCT INR: INR: 1.6

## 2016-12-11 ENCOUNTER — Other Ambulatory Visit: Payer: Self-pay | Admitting: Cardiology

## 2016-12-11 NOTE — Telephone Encounter (Signed)
Pt called to report he is complete out of warfarin and took last dose yesterday. He will need a short supply to get [facility]. Sent short supply to pharmacy of choice.

## 2016-12-12 ENCOUNTER — Ambulatory Visit (INDEPENDENT_AMBULATORY_CARE_PROVIDER_SITE_OTHER): Payer: Medicaid Other | Admitting: Cardiovascular Disease

## 2016-12-12 DIAGNOSIS — I253 Aneurysm of heart: Secondary | ICD-10-CM

## 2016-12-12 DIAGNOSIS — Z7902 Long term (current) use of antithrombotics/antiplatelets: Secondary | ICD-10-CM

## 2016-12-12 LAB — POCT INR: INR: 2.9

## 2016-12-18 ENCOUNTER — Other Ambulatory Visit: Payer: Self-pay | Admitting: Cardiology

## 2016-12-18 NOTE — Telephone Encounter (Signed)
Rx refill sent to pharmacy. 

## 2016-12-19 ENCOUNTER — Ambulatory Visit (INDEPENDENT_AMBULATORY_CARE_PROVIDER_SITE_OTHER): Payer: Medicaid Other | Admitting: Cardiovascular Disease

## 2016-12-19 DIAGNOSIS — I253 Aneurysm of heart: Secondary | ICD-10-CM

## 2016-12-19 DIAGNOSIS — Z7902 Long term (current) use of antithrombotics/antiplatelets: Secondary | ICD-10-CM

## 2016-12-19 LAB — POCT INR: INR: 2

## 2016-12-24 MED FILL — HYDROCODON-APAP 5-325: 5-325 | 8 days supply | Qty: 60 | Fill #0

## 2016-12-28 LAB — POCT INR: INR: 2.1

## 2016-12-30 ENCOUNTER — Ambulatory Visit (INDEPENDENT_AMBULATORY_CARE_PROVIDER_SITE_OTHER): Payer: Medicaid Other | Admitting: Pharmacist

## 2016-12-30 DIAGNOSIS — I253 Aneurysm of heart: Secondary | ICD-10-CM

## 2016-12-30 DIAGNOSIS — Z7902 Long term (current) use of antithrombotics/antiplatelets: Secondary | ICD-10-CM

## 2017-01-01 ENCOUNTER — Telehealth: Payer: Self-pay | Admitting: Physician Assistant

## 2017-01-01 NOTE — Telephone Encounter (Signed)
I did not call this pt today. Not sure who called the pt.

## 2017-01-01 NOTE — Telephone Encounter (Signed)
New message    Patient return call back to nurse from today

## 2017-01-08 ENCOUNTER — Telehealth: Payer: Self-pay | Admitting: *Deleted

## 2017-01-08 NOTE — Telephone Encounter (Signed)
Spoke with pt and he states that he will call and let us know when he is able  to schedule an appt to be seen in the coumadin clinic He states he does not have transportation to have someone just to take him for appts. Pt very agitated and states that he will schedule an appt when he wants to. Pt instructed needs to have INR checked at scheduled times when on coumadin that he may be too thin or may be too  thick and do not want him to bleed or have a stroke and asked him to please call and schedule an appt around the 5th of February and he states he will call when he feels he is able to come for an appt Pt has been called multiple times to try to schedule an appt and at this time we will wait for his call to schedule the appt

## 2017-01-26 ENCOUNTER — Other Ambulatory Visit: Payer: Self-pay | Admitting: Cardiology

## 2017-02-03 ENCOUNTER — Other Ambulatory Visit: Payer: Self-pay | Admitting: *Deleted

## 2017-02-05 ENCOUNTER — Ambulatory Visit (INDEPENDENT_AMBULATORY_CARE_PROVIDER_SITE_OTHER): Payer: Medicaid Other | Admitting: *Deleted

## 2017-02-05 DIAGNOSIS — Z7902 Long term (current) use of antithrombotics/antiplatelets: Secondary | ICD-10-CM | POA: Diagnosis not present

## 2017-02-05 DIAGNOSIS — I253 Aneurysm of heart: Secondary | ICD-10-CM | POA: Diagnosis not present

## 2017-02-05 DIAGNOSIS — D869 Sarcoidosis, unspecified: Secondary | ICD-10-CM | POA: Diagnosis not present

## 2017-02-05 DIAGNOSIS — Z7901 Long term (current) use of anticoagulants: Secondary | ICD-10-CM

## 2017-02-05 LAB — POCT INR: INR: 2

## 2017-02-11 ENCOUNTER — Inpatient Hospital Stay (HOSPITAL_COMMUNITY)
Admission: EM | Admit: 2017-02-11 | Discharge: 2017-02-13 | DRG: 078 | Disposition: A | Discharge status: Home / Self Care | Payer: Medicaid Other | Attending: Internal Medicine | Admitting: Internal Medicine

## 2017-02-11 ENCOUNTER — Encounter (HOSPITAL_COMMUNITY): Payer: Self-pay

## 2017-02-11 ENCOUNTER — Emergency Department (HOSPITAL_COMMUNITY): Payer: Medicaid Other

## 2017-02-11 DIAGNOSIS — G454 Transient global amnesia: Secondary | ICD-10-CM | POA: Diagnosis not present

## 2017-02-11 DIAGNOSIS — I429 Cardiomyopathy, unspecified: Secondary | ICD-10-CM | POA: Diagnosis present

## 2017-02-11 DIAGNOSIS — Z7901 Long term (current) use of anticoagulants: Secondary | ICD-10-CM

## 2017-02-11 DIAGNOSIS — I10 Essential (primary) hypertension: Secondary | ICD-10-CM | POA: Diagnosis not present

## 2017-02-11 DIAGNOSIS — D869 Sarcoidosis, unspecified: Secondary | ICD-10-CM | POA: Diagnosis present

## 2017-02-11 DIAGNOSIS — G8929 Other chronic pain: Secondary | ICD-10-CM | POA: Diagnosis present

## 2017-02-11 DIAGNOSIS — Z87442 Personal history of urinary calculi: Secondary | ICD-10-CM

## 2017-02-11 DIAGNOSIS — Z91041 Radiographic dye allergy status: Secondary | ICD-10-CM

## 2017-02-11 DIAGNOSIS — G9389 Other specified disorders of brain: Secondary | ICD-10-CM | POA: Diagnosis present

## 2017-02-11 DIAGNOSIS — I253 Aneurysm of heart: Secondary | ICD-10-CM | POA: Diagnosis present

## 2017-02-11 DIAGNOSIS — Z886 Allergy status to analgesic agent status: Secondary | ICD-10-CM

## 2017-02-11 DIAGNOSIS — Z820 Family history of epilepsy and other diseases of the nervous system: Secondary | ICD-10-CM

## 2017-02-11 DIAGNOSIS — H5704 Mydriasis: Secondary | ICD-10-CM | POA: Diagnosis present

## 2017-02-11 DIAGNOSIS — Z91013 Allergy to seafood: Secondary | ICD-10-CM

## 2017-02-11 DIAGNOSIS — G9340 Encephalopathy, unspecified: Secondary | ICD-10-CM | POA: Diagnosis present

## 2017-02-11 DIAGNOSIS — I674 Hypertensive encephalopathy: Principal | ICD-10-CM | POA: Diagnosis present

## 2017-02-11 DIAGNOSIS — Z8673 Personal history of transient ischemic attack (TIA), and cerebral infarction without residual deficits: Secondary | ICD-10-CM

## 2017-02-11 DIAGNOSIS — Z79899 Other long term (current) drug therapy: Secondary | ICD-10-CM

## 2017-02-11 DIAGNOSIS — I639 Cerebral infarction, unspecified: Secondary | ICD-10-CM

## 2017-02-11 DIAGNOSIS — D860 Sarcoidosis of lung: Secondary | ICD-10-CM | POA: Diagnosis present

## 2017-02-11 DIAGNOSIS — Z96642 Presence of left artificial hip joint: Secondary | ICD-10-CM | POA: Diagnosis present

## 2017-02-11 DIAGNOSIS — F1410 Cocaine abuse, uncomplicated: Secondary | ICD-10-CM | POA: Diagnosis present

## 2017-02-11 DIAGNOSIS — I160 Hypertensive urgency: Secondary | ICD-10-CM | POA: Diagnosis present

## 2017-02-11 LAB — COMPREHENSIVE METABOLIC PANEL
ALBUMIN: 2.9 g/dL — AB (ref 3.5–5.0)
ALT: 22 U/L (ref 17–63)
AST: 24 U/L (ref 15–41)
Alkaline Phosphatase: 96 U/L (ref 38–126)
Anion gap: 5 (ref 5–15)
BUN: 15 mg/dL (ref 6–20)
CHLORIDE: 106 mmol/L (ref 101–111)
CO2: 26 mmol/L (ref 22–32)
CREATININE: 1.17 mg/dL (ref 0.61–1.24)
Calcium: 8.8 mg/dL — ABNORMAL LOW (ref 8.9–10.3)
GFR calc Af Amer: >60 mL/min (ref >60)
GLUCOSE: 81 mg/dL (ref 65–99)
Potassium: 4.3 mmol/L (ref 3.5–5.1)
Sodium: 137 mmol/L (ref 135–145)
Total Bilirubin: 0.6 mg/dL (ref 0.3–1.2)
Total Protein: 6.5 g/dL (ref 6.5–8.1)

## 2017-02-11 LAB — CBC
HEMATOCRIT: 40.9 % (ref 39.0–52.0)
HEMOGLOBIN: 13.4 g/dL (ref 13.0–17.0)
MCH: 28.4 pg (ref 26.0–34.0)
MCHC: 32.8 g/dL (ref 30.0–36.0)
MCV: 86.7 fL (ref 78.0–100.0)
Platelets: 173 10*3/uL (ref 150–400)
RBC: 4.72 MIL/uL (ref 4.22–5.81)
RDW: 13.3 % (ref 11.5–15.5)
WBC: 4.7 10*3/uL (ref 4.0–10.5)

## 2017-02-11 LAB — DIFFERENTIAL
BASOS ABS: 0 10*3/uL (ref 0.0–0.1)
Basophils Relative: 0 %
Eosinophils Absolute: 0.2 10*3/uL (ref 0.0–0.7)
Eosinophils Relative: 3 %
Lymphocytes Relative: 24 %
Lymphs Abs: 1.1 10*3/uL (ref 0.7–4.0)
MONOS PCT: 10 %
Monocytes Absolute: 0.5 10*3/uL (ref 0.1–1.0)
NEUTROS ABS: 3 10*3/uL (ref 1.7–7.7)
NEUTROS PCT: 63 %

## 2017-02-11 LAB — URINALYSIS, ROUTINE W REFLEX MICROSCOPIC
Bilirubin Urine: NEGATIVE
GLUCOSE, UA: NEGATIVE mg/dL
Hgb urine dipstick: NEGATIVE
Ketones, ur: NEGATIVE mg/dL
LEUKOCYTES UA: NEGATIVE
NITRITE: NEGATIVE
PH: 7 (ref 5.0–8.0)
Protein, ur: NEGATIVE mg/dL
SPECIFIC GRAVITY, URINE: 1.015 (ref 1.005–1.030)

## 2017-02-11 LAB — PROTIME-INR
INR: 1.95
Prothrombin Time: 22.5 seconds — ABNORMAL HIGH (ref 11.4–15.2)

## 2017-02-11 LAB — CBG MONITORING, ED: Glucose-Capillary: 74 mg/dL (ref 65–99)

## 2017-02-11 LAB — I-STAT CHEM 8, ED
BUN: 18 mg/dL (ref 6–20)
CHLORIDE: 106 mmol/L (ref 101–111)
Calcium, Ion: 1.23 mmol/L (ref 1.15–1.40)
Creatinine, Ser: 1.2 mg/dL (ref 0.61–1.24)
Glucose, Bld: 75 mg/dL (ref 65–99)
HEMATOCRIT: 41 % (ref 39.0–52.0)
Hemoglobin: 13.9 g/dL (ref 13.0–17.0)
POTASSIUM: 4.1 mmol/L (ref 3.5–5.1)
SODIUM: 142 mmol/L (ref 135–145)
TCO2: 28 mmol/L (ref 0–100)

## 2017-02-11 LAB — APTT: APTT: 33 s (ref 24–36)

## 2017-02-11 LAB — I-STAT TROPONIN, ED: TROPONIN I, POC: 0 ng/mL (ref 0.00–0.08)

## 2017-02-11 MED ORDER — HYDROCHLOROTHIAZIDE 25 MG PO TABS
25.0000 mg | ORAL_TABLET | Freq: Every day | ORAL | Status: DC
  Administered 2017-02-12 – 2017-02-13 (×2): 25 mg via ORAL
  Filled 2017-02-11 (×2): qty 1

## 2017-02-11 MED ORDER — CARVEDILOL 12.5 MG PO TABS
12.5000 mg | ORAL_TABLET | Freq: Two times a day (BID) | ORAL | Status: DC
  Administered 2017-02-12 – 2017-02-13 (×3): 12.5 mg via ORAL
  Filled 2017-02-11 (×3): qty 1

## 2017-02-11 MED ORDER — ACETAMINOPHEN 650 MG RE SUPP: 650.0000 mg | RECTAL | Status: DC | PRN

## 2017-02-11 MED ORDER — ACETAMINOPHEN 160 MG/5ML PO SOLN: 650.0000 mg | ORAL | Status: DC | PRN

## 2017-02-11 MED ORDER — WARFARIN - PHARMACIST DOSING INPATIENT
Freq: Every day | Status: DC
Start: 2017-02-12 — End: 2017-02-13
  Administered 2017-02-12: 18:00:00

## 2017-02-11 MED ORDER — OXYCODONE HCL 5 MG PO TABS
5.0000 mg | ORAL_TABLET | ORAL | Status: DC | PRN
  Administered 2017-02-12: 5 mg via ORAL
  Filled 2017-02-11: qty 1

## 2017-02-11 MED ORDER — LISINOPRIL 20 MG PO TABS
30.0000 mg | ORAL_TABLET | Freq: Every day | ORAL | Status: DC
  Administered 2017-02-12 – 2017-02-13 (×2): 30 mg via ORAL
  Filled 2017-02-11 (×2): qty 1

## 2017-02-11 MED ORDER — METOPROLOL TARTRATE 5 MG/5ML IV SOLN
5.0000 mg | Freq: Once | INTRAVENOUS | Status: AC
  Administered 2017-02-11: 5 mg via INTRAVENOUS
  Filled 2017-02-11: qty 5

## 2017-02-11 MED ORDER — ACETAMINOPHEN 325 MG PO TABS: 650.0000 mg | ORAL_TABLET | ORAL | Status: DC | PRN

## 2017-02-11 MED ORDER — HYDRALAZINE HCL 20 MG/ML IJ SOLN: 10.0000 mg | INTRAMUSCULAR | Status: DC | PRN

## 2017-02-11 MED ORDER — DOCUSATE SODIUM 100 MG PO CAPS: 100.0000 mg | ORAL_CAPSULE | Freq: Every day | ORAL | Status: DC | PRN

## 2017-02-11 MED ORDER — WARFARIN SODIUM 7.5 MG PO TABS
12.5000 mg | ORAL_TABLET | Freq: Once | ORAL | Status: AC
  Administered 2017-02-11: 12.5 mg via ORAL
  Filled 2017-02-11: qty 1

## 2017-02-11 NOTE — ED Triage Notes (Signed)
Pt appears to have some aphasia is orient to person and place

## 2017-02-11 NOTE — Progress Notes (Signed)
Pt admitted from ED with stroke like symptoms, alert to self only but follows command, settled in bed with family and call light at bedside, tele monitor put and verified on pt, was however reassured and will continue to monitor. Obasogie-Asidi, Cynthya Yam Efe

## 2017-02-11 NOTE — H&P (Signed)
History and Physical    Tony Howard Idler [id number] DOB: 08/30/1961 DOA: 02/11/2017  PCP: Karie ChimeraEESE,BETTI D, MD  Patient coming from: Home.  Chief Complaint: Infusion.  HPI: Tony Howard is a 56 y.o. male with history of LV aneurysm and stroke on Coumadin, sarcoidosis, hypertension was found to be confused the patient's wife this evening. Patient's wife earlier in the day found patient was normal. Patient was brought to the ER.   ED Course: CT of the head was unremarkable. Patient remained confused. Neurology has been consulted. On exam patient is oriented to his name and place. But at times forgetful. Which is new for the patient. Patient is being admitted for further observation and workup. Patient's blood pressures also found to be elevated. UA and urine drug screen are pending.   Review of Systems: As per HPI, rest all negative.   Past Medical History:  Diagnosis Date  . Aneurysm of heart    LHC 03/13/12:  Normal cors, EF 60-65% with small but definite apical aneurysm.  He was started on coumadin as thrombus formation was a high probability  . Cardiac aneurysm   . Chronic pain   . Cutaneous sarcoidosis (HCC)   . CVA (cerebral infarction)    Head CT showing encephalomalacia c/w old infarct in the distribution of the right PCA in April 2013  . DJD (degenerative joint disease)   . Family history of adverse reaction to anesthesia    pts mother and pts sister had difficulty with breathing   . History of kidney stones   . History of stroke   . Hypertension    echo 4/13:  mild LVH, EF 65-70%, grade 1 diast dysfxn, mild LAE  . Left sided numbness   . Osteoarthritis   . Sarcoidosis of lung (HCC)    Myoview 3/13 with EF 47%; anterior ischemia;  LHC without CAD;  MRI recommended to r/o cardiac sarcoid - but patient claustrophobic and MRI not done  . Stroke (HCC)   . Swallowing difficulty     Past Surgical History:  Procedure Laterality Date  . CYSTOSCOPY W/ URETERAL STENT  PLACEMENT    . KNEE SURGERY     right / childhood   . LEFT HEART CATHETERIZATION WITH CORONARY ANGIOGRAM N/A 03/13/2012   Procedure: LEFT HEART CATHETERIZATION WITH CORONARY ANGIOGRAM;  Surgeon: Vesta MixerPhilip J Nahser, MD;  [facility];  Service: Cardiovascular;  Laterality: N/A;  Contrast allergy  . TOTAL HIP ARTHROPLASTY Left 11/07/2016   Procedure: LEFT TOTAL HIP ARTHROPLASTY ANTERIOR APPROACH;  Surgeon: Samson FredericBrian Swinteck, MD;  Location: [facility];  Service: Orthopedics;  Laterality: Left;  Needs RNFA  . WISDOM TOOTH EXTRACTION     4     reports that he has never smoked. He has never used smokeless tobacco. He reports that he drinks alcohol. He reports that he does not use drugs.  Allergies  Allergen Reactions  . Iodine Hives and Rash  . Naprosyn [Naproxen] Hives  . Shellfish Allergy Hives    Family History  Problem Relation Age of Onset  . Lung cancer Mother   . Multiple sclerosis Sister     Prior to Admission medications   Medication Sig Start Date End Date Taking? Authorizing Provider  carvedilol (COREG) 12.5 MG tablet TAKE ONE-HALF TABLET BY MOUTH TWICE DAILY WITH A MEAL 12/11/16  Yes Laurey Moralealton S McLean, MD  docusate sodium (COLACE) 100 MG capsule Take 1 capsule (100 mg total) by mouth 2 (two) times daily. Patient taking differently: Take  100 mg by mouth daily as needed for moderate constipation.  11/08/16  Yes Samson Frederic, MD  hydrochlorothiazide (HYDRODIURIL) 25 MG tablet TAKE ONE TABLET BY MOUTH DAILY 12/18/16  Yes Laurey Morale, MD  lisinopril (PRINIVIL,ZESTRIL) 30 MG tablet TAKE ONE TABLET BY MOUTH  DAILY 12/11/16  Yes Laurey Morale, MD  oxyCODONE (OXY IR/ROXICODONE) 5 MG immediate release tablet Take 1-2 tablets (5-10 mg total) by mouth every 3 (three) hours as needed for breakthrough pain. 11/08/16  Yes Samson Frederic, MD  warfarin (COUMADIN) 10 MG tablet TAKE AS DIRECTED BY  COUMADIN  CLINIC  (NEED  TO  BE  SEEN  IN  COUMADIN  CLINIC  JAN  5TH  SO  REST  OF  REFILL  WILL  BE   DONE  THEN) Patient taking differently: takes 15mg  on sat onlt, takes 10mg  all other days (in evenings) 12/11/16  Yes Laurey Morale, MD  atorvastatin (LIPITOR) 40 MG tablet Take 1 tablet (40 mg total) by mouth daily. Patient not taking: Reported on 02/11/2017 11/29/15   Laurey Morale, MD  furosemide (LASIX) 20 MG tablet Take 1 tablet (20 mg total) by mouth daily. Patient not taking: Reported on 02/11/2017 11/29/15   Laurey Morale, MD    Physical Exam: Vitals:   02/11/17 2001 02/11/17 2045 02/11/17 2115 02/11/17 2200  BP: (!) 169/118 (!) 171/110 (!) 157/125 (!) 157/104  Pulse: 62 (!) 59 (!) 56   Resp: 16 18 19 18   Temp:      TempSrc:      SpO2: 100% 100% 99%   Weight:      Height:          Constitutional: Moderately built and nourished. Vitals:   02/11/17 2001 02/11/17 2045 02/11/17 2115 02/11/17 2200  BP: (!) 169/118 (!) 171/110 (!) 157/125 (!) 157/104  Pulse: 62 (!) 59 (!) 56   Resp: 16 18 19 18   Temp:      TempSrc:      SpO2: 100% 100% 99%   Weight:      Height:       Eyes: Anicteric no pallor. ENMT:  No discharge from the ears eyes nose and mouth. Neck:  No mass felt. No neck rigidity. No JVD appreciated. Respiratory:  No rhonchi or crepitations. Cardiovascular:  S1-S2 heard no murmurs appreciated. Abdomen:  Soft nontender bowel sounds present. No guarding or rigidity. Musculoskeletal:  No edema. No joint effusion. Skin:  No rash. Skin appears warm. Neurologic: alert awake oriented to name and place. Moves all extremities 5 x 5. No facial asymmetry tongue is midline. Psychiatric:  Patient appears confused.   Labs on Admission: I have personally reviewed following labs and imaging studies  CBC:  Recent Labs Lab 02/11/17 1811 02/11/17 1830  WBC 4.7  --   NEUTROABS 3.0  --   HGB 13.4 13.9  HCT 40.9 41.0  MCV 86.7  --   PLT 173  --    Basic Metabolic Panel:  Recent Labs Lab 02/11/17 1811 02/11/17 1830  NA 137 142  K 4.3 4.1  CL 106 106  CO2 26  --    GLUCOSE 81 75  BUN 15 18  CREATININE 1.17 1.20  CALCIUM 8.8*  --    GFR: Estimated Creatinine Clearance: 104.8 mL/min (by C-G formula based on SCr of 1.2 mg/dL). Liver Function Tests:  Recent Labs Lab 02/11/17 1811  AST 24  ALT 22  ALKPHOS 96  BILITOT 0.6  PROT 6.5  ALBUMIN  2.9*   No results for input(s): LIPASE, AMYLASE in the last 168 hours. No results for input(s): AMMONIA in the last 168 hours. Coagulation Profile:  Recent Labs Lab 02/05/17 1429 02/11/17 1811  INR 2.0 1.95   Cardiac Enzymes: No results for input(s): CKTOTAL, CKMB, CKMBINDEX, TROPONINI in the last 168 hours. BNP (last 3 results) No results for input(s): PROBNP in the last 8760 hours. HbA1C: No results for input(s): HGBA1C in the last 72 hours. CBG:  Recent Labs Lab 02/11/17 1821  GLUCAP 74   Lipid Profile: No results for input(s): CHOL, HDL, LDLCALC, TRIG, CHOLHDL, LDLDIRECT in the last 72 hours. Thyroid Function Tests: No results for input(s): TSH, T4TOTAL, FREET4, T3FREE, THYROIDAB in the last 72 hours. Anemia Panel: No results for input(s): VITAMINB12, FOLATE, FERRITIN, TIBC, IRON, RETICCTPCT in the last 72 hours. Urine analysis:    Component Value Date/Time   COLORURINE YELLOW 03/10/2012 0139   APPEARANCEUR CLEAR 03/10/2012 0139   LABSPEC 1.026 03/10/2012 0139   PHURINE 6.0 03/10/2012 0139   GLUCOSEU NEGATIVE 03/10/2012 0139   HGBUR NEGATIVE 03/10/2012 0139   BILIRUBINUR NEGATIVE 03/10/2012 0139   KETONESUR NEGATIVE 03/10/2012 0139   PROTEINUR NEGATIVE 03/10/2012 0139   UROBILINOGEN 1.0 03/10/2012 0139   NITRITE NEGATIVE 03/10/2012 0139   LEUKOCYTESUR NEGATIVE 03/10/2012 0139   Sepsis Labs: @LABRCNTIP (procalcitonin:4,lacticidven:4) )No results found for this or any previous visit (from the past 240 hour(s)).   Radiological Exams on Admission: Dg Chest 2 View  Result Date: 02/11/2017 CLINICAL DATA:  56 y/o  M; all altered mental status. EXAM: CHEST  2 VIEW COMPARISON:   None. FINDINGS: Stable normal cardiac silhouette. Aortic atherosclerosis with calcification. Low lung volumes accentuate pulmonary markings. With minor right basilar atelectasis. No focal consolidation. No pleural effusion or pneumothorax. Mild degenerative changes of thoracic spine. IMPRESSION: Low lung volumes. Minor right basilar atelectasis. No focal consolidation. Electronically Signed   By: Mitzi Hansen M.D.   On: 02/11/2017 19:56   Ct Head Wo Contrast  Result Date: 02/11/2017 CLINICAL DATA:  Altered mental status, left-sided weakness EXAM: CT HEAD WITHOUT CONTRAST TECHNIQUE: Contiguous axial images were obtained from the base of the skull through the vertex without intravenous contrast. COMPARISON:  03/10/2012 FINDINGS: Brain: No acute territorial infarction, intracranial hemorrhage or focal mass lesion is visualized. Old encephalomalacia of the right occipital and posterior temporal lobes as before. Mild ex vacuo dilatation of the right lateral ventricle. Mild atrophy. No midline shift. Vascular: No hyperdense vessels.  No unexpected calcifications. Skull: Normal. Negative for fracture or focal lesion. Sinuses/Orbits: Mucosal thickening in the maxillary sinus. No acute orbital abnormality. Other: None IMPRESSION: 1. No CT evidence for acute intracranial abnormality. 2. Old encephalomalacia of the right posterior temporal and occipital lobes, unchanged. Electronically Signed   By: Jasmine Pang M.D.   On: 02/11/2017 20:05    EKG: Independently reviewed.  Normal sinus with nonspecific T-wave changes.  Assessment/Plan Principal Problem:   Transient global amnesia Active Problems:   Sarcoidosis (HCC)   Essential hypertension   LV Apical Aneurysm   Acute encephalopathy   1. Transient global amnesia - discussed with neurologist Dr. Otelia Limes. If MRI is negative for stroke been patient probably has transient global amnesia for which we will get EEG B 12 folate RPR and TSH  levels. 2. Uncontrolled hypertension - if MRI is negative for strokes and we will more aggressive control of blood pressure. Patient is on hydrochlorothiazide and lisinopril. I have placed patient on when necessary IV hydralazine for systolic blood  pressure more than 220 and diastolic more than 1 20. Parameters could be changed once stroke ruled out. 3. LV aneurysm/cardiomyopathy with history of stroke - on Coumadin. Pharmacy to dose. Patient is also on lisinopril. Last EF measured was in January 2017 EF of 55%. 4. Sarcoidosis - patient is not on any medications at this time.   DVT prophylaxis:  Coumadin. Code Status:  Full code.  Family Communication:  Patient's wife.  Disposition Plan:  Home.  Consults called:  Neurology.  Admission status:  Observation.    Eduard Clos MD [facility] Pager [phone].  If 7PM-7AM, please contact night-coverage [email or web address] Password [email or web address]  02/11/2017, 10:13 PM

## 2017-02-11 NOTE — ED Notes (Addendum)
Called report, nurse wanted BP lower, called Dr Toniann FailKakrakandy and he stated "he does not want to lower BP due to stroke rule out, pt is stable to go to floor"

## 2017-02-11 NOTE — Consult Note (Signed)
NEURO HOSPITALIST CONSULT NOTE   Requestig physician: Dr. Hal Hope  Reason for Consult: Acute onset of short-term memory loss with disorientation  History obtained from:  Patient, Wife and Chart     HPI:                                                                                                                                          Tony Howard is an 56 y.o. male who presents with acute anterograde memory loss. Symptoms began on 3/6. LKN at 11:30 AM when he spoke with wife on the telephone and sounded normal. When she returned from work he was confused, asking the same questions over and over again and forgetting answers given to him just a few minutes before. All of his questions regarded very recent occurrences or objects in the vicinity such as "why is that glass over there?", again, rapidly forgetting the answers given to him. The only two aspects that seemed to involve long term memory were his not realizing that Trump was president and also asking about where his son was, as though the son was still living at home, when in fact the son had moved to another state about 3 years ago. His BP was also elevated at home, with a reading taken by his wife of 160/110, well above his usual range of 120s/60s.  He is on chronic anticoagulation with Coumadin for a cardiac aneurysm, which was felt to present a high risk for thrombus formation. He has evidence of a prior CVA with head CT in 2013 showing encephalomalacia consistent with an old PCA infarction. He also has a history of pulmonary sarcoidosis.   Past Medical History:  Diagnosis Date  . Aneurysm of heart    LHC 03/13/12:  Normal cors, EF 60-65% with small but definite apical aneurysm.  He was started on coumadin as thrombus formation was a high probability  . Cardiac aneurysm   . Chronic pain   . Cutaneous sarcoidosis ([place])   . CVA (cerebral infarction)    Head CT showing encephalomalacia c/w old infarct in the  distribution of the right PCA in April 2013  . DJD (degenerative joint disease)   . Family history of adverse reaction to anesthesia    pts mother and pts sister had difficulty with breathing   . History of kidney stones   . History of stroke   . Hypertension    echo 4/13:  mild LVH, EF 65-70%, grade 1 diast dysfxn, mild LAE  . Left sided numbness   . Osteoarthritis   . Sarcoidosis of lung ([place])    Myoview 3/13 with EF 47%; anterior ischemia;  LHC without CAD;  MRI recommended to r/o cardiac sarcoid - but patient claustrophobic and MRI not done  . Stroke (  [place])   . Swallowing difficulty     Past Surgical History:  Procedure Laterality Date  . CYSTOSCOPY W/ URETERAL STENT PLACEMENT    . KNEE SURGERY     right / childhood   . LEFT HEART CATHETERIZATION WITH CORONARY ANGIOGRAM N/A 03/13/2012   Procedure: LEFT HEART CATHETERIZATION WITH CORONARY ANGIOGRAM;  Surgeon: Thayer Headings, MD;  Location: [facility] CATH LAB;  Service: Cardiovascular;  Laterality: N/A;  Contrast allergy  . TOTAL HIP ARTHROPLASTY Left 11/07/2016   Procedure: LEFT TOTAL HIP ARTHROPLASTY ANTERIOR APPROACH;  Surgeon: Rod Can, MD;  Location: [facility];  Service: Orthopedics;  Laterality: Left;  Needs RNFA  . WISDOM TOOTH EXTRACTION     4    Family History  Problem Relation Age of Onset  . Lung cancer Mother   . Multiple sclerosis Sister    Social History:  reports that he has never smoked. He has never used smokeless tobacco. He reports that he drinks alcohol. He reports that he does not use drugs.  Allergies  Allergen Reactions  . Iodine Hives and Rash  . Naprosyn [Naproxen] Hives  . Shellfish Allergy Hives    MEDICATIONS:                                                                                                                     Prior to Admission:  Prescriptions Prior to Admission  Medication Sig Dispense Refill Last Dose  . carvedilol (COREG) 12.5 MG tablet TAKE ONE-HALF TABLET BY MOUTH TWICE DAILY  WITH A MEAL 30 tablet 11 02/10/2017 at am  . docusate sodium (COLACE) 100 MG capsule Take 1 capsule (100 mg total) by mouth 2 (two) times daily. (Patient taking differently: Take 100 mg by mouth daily as needed for moderate constipation. ) 10 capsule 0 Past Month at Unknown time  . hydrochlorothiazide (HYDRODIURIL) 25 MG tablet TAKE ONE TABLET BY MOUTH DAILY 30 tablet 3 02/10/2017 at Unknown time  . lisinopril (PRINIVIL,ZESTRIL) 30 MG tablet TAKE ONE TABLET BY MOUTH  DAILY 30 tablet 11 02/10/2017 at Unknown time  . oxyCODONE (OXY IR/ROXICODONE) 5 MG immediate release tablet Take 1-2 tablets (5-10 mg total) by mouth every 3 (three) hours as needed for breakthrough pain. 90 tablet 0 Past Week at Unknown time  . warfarin (COUMADIN) 10 MG tablet TAKE AS DIRECTED BY  COUMADIN  CLINIC  (NEED  TO  BE  SEEN  IN  COUMADIN  CLINIC  JAN  5TH  SO  REST  OF  REFILL  WILL  BE  DONE  THEN) (Patient taking differently: takes 69m on sat onlt, takes 157mall other days (in evenings)) 20 tablet 0 02/10/2017 at Unknown time  . atorvastatin (LIPITOR) 40 MG tablet Take 1 tablet (40 mg total) by mouth daily. (Patient not taking: Reported on 02/11/2017) 30 tablet 11 Not Taking at Unknown time  . furosemide (LASIX) 20 MG tablet Take 1 tablet (20 mg total) by mouth daily. (Patient not taking: Reported on 02/11/2017) 30 tablet 11  Not Taking at Unknown time   Scheduled: . carvedilol  12.5 mg Oral BID WC  . hydrochlorothiazide  25 mg Oral Daily  . lisinopril  30 mg Oral Daily  . Warfarin - Pharmacist Dosing Inpatient   Does not apply q1800     ROS:                                                                                                                                       History obtained from wife. Does not endorse symptoms of infection, chest pain, headache or limb pain. Positive for mild lower extremity edema. Other ROS as per HPI.   Blood pressure (!) 157/125, pulse (!) 56, temperature 98.9 F (37.2 C), resp. rate 19,  height '6\' 4"'  (1.93 m), weight 136.1 kg (300 lb), SpO2 99 %.   General Examination:                                                                                                      HEENT-  Normocephalic/atraumatic.  Lungs- Respirations unlabored Extremities- Warm and well-perfused. Mild nonpitting edema involving dorsum of feet bilaterally.   Neurological Examination Mental Status: Pleasant and cooperative with good eye contact. Alert, oriented to city and state, but not to time (day, year or month). Speech fluent without errors of grammar or syntax. Repetition impaired. Naming intact. Dyscalculia noted. Increased latency of verbal responses (mild). Does not recall 5/6 items after a 3 minute delay and is not be reminded of these items with a cue. Does not recall ambulance ride to ED or several events that occurred during the day. Follows all motor commands without hesitation or apraxia.  Cranial Nerves: II: Visual fields intact to confrontation, pupils 6 mm >> 4 mm with penlight III,IV, VI: ptosis not present, EOMI without nystagmus. V,VII: smile symmetric, facial temperature sensation normal bilaterally VIII: hearing intact to questions and commands IX,X: Palate rises symmetrically XI: bilateral shoulder shrug symmetric XII: midline tongue extension Motor: Right : Upper extremity   5/5    Left:     Upper extremity   5/5  Lower extremity   5/5     Lower extremity   5/5 Normal tone throughout; no atrophy noted Sensory: Temperature and light touch intact in all 4 extremities. No extinction.  Deep Tendon Reflexes: 3+ right biceps and brachioradialis, 2+ left biceps and brachioradialis, 2+ patellae, 1+ right achilles, 2+ left achilles. Toes upgoing bilaterally Cerebellar: No ataxia with FNF bilaterally.  Gait: Deferred  Lab  Results: Basic Metabolic Panel:  Recent Labs Lab 02/11/17 1811 02/11/17 1830  NA 137 142  K 4.3 4.1  CL 106 106  CO2 26  --   GLUCOSE 81 75  BUN 15 18   CREATININE 1.17 1.20  CALCIUM 8.8*  --     Liver Function Tests:  Recent Labs Lab 02/11/17 1811  AST 24  ALT 22  ALKPHOS 96  BILITOT 0.6  PROT 6.5  ALBUMIN 2.9*   No results for input(s): LIPASE, AMYLASE in the last 168 hours. No results for input(s): AMMONIA in the last 168 hours.  CBC:  Recent Labs Lab 02/11/17 1811 02/11/17 1830  WBC 4.7  --   NEUTROABS 3.0  --   HGB 13.4 13.9  HCT 40.9 41.0  MCV 86.7  --   PLT 173  --     Cardiac Enzymes: No results for input(s): CKTOTAL, CKMB, CKMBINDEX, TROPONINI in the last 168 hours.  Lipid Panel: No results for input(s): CHOL, TRIG, HDL, CHOLHDL, VLDL, LDLCALC in the last 168 hours.  CBG:  Recent Labs Lab 02/11/17 1821  GLUCAP 74    Microbiology: Results for orders placed or performed during the hospital encounter of 10/29/16  Surgical pcr screen     Status: None   Collection Time: 10/29/16  2:10 PM  Result Value Ref Range Status   MRSA, PCR NEGATIVE NEGATIVE Final   Staphylococcus aureus NEGATIVE NEGATIVE Final    Comment:        The Xpert SA Assay (FDA approved for NASAL specimens in patients over 89 years of age), is one component of a comprehensive surveillance program.  Test performance has been validated by [facility] for patients greater than or equal to 39 year old. It is not intended to diagnose infection nor to guide or monitor treatment.     Coagulation Studies:  Recent Labs  02/11/17 1811  LABPROT 22.5*  INR 1.95    Imaging: Dg Chest 2 View  Result Date: 02/11/2017 CLINICAL DATA:  56 y/o  M; all altered mental status. EXAM: CHEST  2 VIEW COMPARISON:  None. FINDINGS: Stable normal cardiac silhouette. Aortic atherosclerosis with calcification. Low lung volumes accentuate pulmonary markings. With minor right basilar atelectasis. No focal consolidation. No pleural effusion or pneumothorax. Mild degenerative changes of thoracic spine. IMPRESSION: Low lung volumes. Minor right basilar  atelectasis. No focal consolidation. Electronically Signed   By: Kristine Garbe M.D.   On: 02/11/2017 19:56   Ct Head Wo Contrast  Result Date: 02/11/2017 CLINICAL DATA:  Altered mental status, left-sided weakness EXAM: CT HEAD WITHOUT CONTRAST TECHNIQUE: Contiguous axial images were obtained from the base of the skull through the vertex without intravenous contrast. COMPARISON:  03/10/2012 FINDINGS: Brain: No acute territorial infarction, intracranial hemorrhage or focal mass lesion is visualized. Old encephalomalacia of the right occipital and posterior temporal lobes as before. Mild ex vacuo dilatation of the right lateral ventricle. Mild atrophy. No midline shift. Vascular: No hyperdense vessels.  No unexpected calcifications. Skull: Normal. Negative for fracture or focal lesion. Sinuses/Orbits: Mucosal thickening in the maxillary sinus. No acute orbital abnormality. Other: None IMPRESSION: 1. No CT evidence for acute intracranial abnormality. 2. Old encephalomalacia of the right posterior temporal and occipital lobes, unchanged. Electronically Signed   By: Donavan Foil M.D.   On: 02/11/2017 20:05   Assessment: 1. History and exam findings most consistent with transient global amnesia (TGA). However, atypical feature of some long-term memory loss for some facts as well. An unwitnessed seizure  with postictal state is also on the DDx.  2. Asymmetric reflexes with upgoing toes. Most likely secondary to known old stroke.  3. Relatively low vitamin B12 by Neurological standards. 4. TSH normal. 5. Cocaine positive. Question transient cerebral vasoconstriction as the underlying etiology for his anterograde memory loss. Certainly, this could account for his HTN in the ED as well as somewhat dilated pupils. Also on DDx for his memory loss is hypertensive encephalopathy.  6. CT with no acute intracranial abnormality. Old encephalomalacia of the right posterior temporal and occipital lobes is stable.    Recommendations: 1. MRI brain.  2. EEG.  3. Ammonia, thiamine level, ESR, RPR, TSH.  4. Vitamin B12 supplementation, 2 mg po qd.  5. Drug cessation counseling.   Electronically signed: Dr. Kerney Elbe 02/11/2017, 9:47 PM

## 2017-02-11 NOTE — ED Notes (Signed)
Provider at bedside

## 2017-02-11 NOTE — Progress Notes (Signed)
ANTICOAGULATION CONSULT NOTE - Initial Consult  Pharmacy Consult for warfarin Indication: LV apical aneurysm   Allergies  Allergen Reactions  . Iodine Hives and Rash  . Naprosyn [Naproxen] Hives  . Shellfish Allergy Hives    Patient Measurements: Height: 6\' 4"  (193 cm) Weight: 300 lb (136.1 kg) IBW/kg (Calculated) : 86.8   Vital Signs: Temp: 98.9 F (37.2 C) (03/06 1958) Temp Source: Oral (03/06 1801) BP: 157/104 (03/06 2200) Pulse Rate: 56 (03/06 2115)  Labs:  Recent Labs  02/11/17 1811 02/11/17 1830  HGB 13.4 13.9  HCT 40.9 41.0  PLT 173  --   APTT 33  --   LABPROT 22.5*  --   INR 1.95  --   CREATININE 1.17 1.20    Estimated Creatinine Clearance: 104.8 mL/min (by C-G formula based on SCr of 1.2 mg/dL).   Medical History: Past Medical History:  Diagnosis Date  . Aneurysm of heart    LHC 03/13/12:  Normal cors, EF 60-65% with small but definite apical aneurysm.  He was started on coumadin as thrombus formation was a high probability  . Cardiac aneurysm   . Chronic pain   . Cutaneous sarcoidosis (HCC)   . CVA (cerebral infarction)    Head CT showing encephalomalacia c/w old infarct in the distribution of the right PCA in April 2013  . DJD (degenerative joint disease)   . Family history of adverse reaction to anesthesia    pts mother and pts sister had difficulty with breathing   . History of kidney stones   . History of stroke   . Hypertension    echo 4/13:  mild LVH, EF 65-70%, grade 1 diast dysfxn, mild LAE  . Left sided numbness   . Osteoarthritis   . Sarcoidosis of lung (HCC)    Myoview 3/13 with EF 47%; anterior ischemia;  LHC without CAD;  MRI recommended to r/o cardiac sarcoid - but patient claustrophobic and MRI not done  . Stroke (HCC)   . Swallowing difficulty      Assessment: 56 yo male admitted with altered mental status. Pt is on warfarin PTA for a history of a LV apical aneurysm. Admit INR is slightly SUBtherapeutic at 1.95.  PTA  warfarin regimen: 15 mg qSat, 10 mg all other days  Goal of Therapy:  INR 2-3 Monitor platelets by anticoagulation protocol: Yes    Plan:  -Will give slight boost 12.5 mg x1 tonight then resume PTA regimen -Daily INR   Laura Caldas, Darl HouseholderAlison M 02/11/2017,10:21 PM

## 2017-02-11 NOTE — ED Provider Notes (Signed)
[facility]-EMERGENCY DEPT Provider Note   CSN: [id number] Arrival date & time: 02/11/17  1758     History   Chief Complaint Chief Complaint  Patient presents with  . Altered Mental Status    Pt last seen normal at 0630 this morning upon arrival home by his wife this evening was found to be confused pt is orient x2     HPI Tony Howard is a 56 y.o. male.  Pt presents to the ED today with MS change.  The pt was last seen normal around 0630 this morning by his wife.  She came home and found him to be confused.  The pt said he did not take his medications today.  He is not sure if he ate.  The pt denies any pain.  He said that he feels ok.      Past Medical History:  Diagnosis Date  . Aneurysm of heart    LHC 03/13/12:  Normal cors, EF 60-65% with small but definite apical aneurysm.  He was started on coumadin as thrombus formation was a high probability  . Cardiac aneurysm   . Chronic pain   . Cutaneous sarcoidosis (HCC)   . CVA (cerebral infarction)    Head CT showing encephalomalacia c/w old infarct in the distribution of the right PCA in April 2013  . DJD (degenerative joint disease)   . Family history of adverse reaction to anesthesia    pts mother and pts sister had difficulty with breathing   . History of kidney stones   . History of stroke   . Hypertension    echo 4/13:  mild LVH, EF 65-70%, grade 1 diast dysfxn, mild LAE  . Left sided numbness   . Osteoarthritis   . Sarcoidosis of lung (HCC)    Myoview 3/13 with EF 47%; anterior ischemia;  LHC without CAD;  MRI recommended to r/o cardiac sarcoid - but patient claustrophobic and MRI not done  . Stroke (HCC)   . Swallowing difficulty     Patient Active Problem List   Diagnosis Date Noted  . Acute encephalopathy 02/11/2017  . Avascular necrosis of hip, left (HCC) 11/07/2016  . Chest pain 11/28/2015  . Encounter for long-term (current) use of antiplatelets/antithrombotics 03/03/2014  . Edema 04/23/2012  .  Bradycardia 04/15/2012  . 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 Long term (current) use of anticoagulants 03/18/2012  . Chest pain on exertion 03/10/2012  . DOE (dyspnea on exertion) 03/10/2012  . Sarcoidosis (HCC) 03/10/2012  . HTN (hypertension) 03/10/2012  . Noncompliance 03/10/2012    Past Surgical History:  Procedure Laterality Date  . CYSTOSCOPY W/ URETERAL STENT PLACEMENT    . KNEE SURGERY     right / childhood   . LEFT HEART CATHETERIZATION WITH CORONARY ANGIOGRAM N/A 03/13/2012   Procedure: LEFT HEART CATHETERIZATION WITH CORONARY ANGIOGRAM;  Surgeon: Vesta Mixer, MD;  Location: [facility] CATH LAB;  Service: Cardiovascular;  Laterality: N/A;  Contrast allergy  . TOTAL HIP ARTHROPLASTY Left 11/07/2016   Procedure: LEFT TOTAL HIP ARTHROPLASTY ANTERIOR APPROACH;  Surgeon: Samson Frederic, MD;  Location: [facility] ORS;  Service: Orthopedics;  Laterality: Left;  Needs RNFA  . WISDOM TOOTH EXTRACTION     4       Home Medications    Prior to Admission medications   Medication Sig Start Date End Date Taking? Authorizing Provider  carvedilol (COREG) 12.5 MG tablet TAKE ONE-HALF TABLET BY MOUTH TWICE DAILY WITH A MEAL 12/11/16  Yes Laurey Morale, MD  docusate sodium (COLACE) 100 MG capsule Take 1 capsule (100 mg total) by mouth 2 (two) times daily. Patient taking differently: Take 100 mg by mouth daily as needed for moderate constipation.  11/08/16  Yes Samson FredericBrian Swinteck, MD  hydrochlorothiazide (HYDRODIURIL) 25 MG tablet TAKE ONE TABLET BY MOUTH DAILY 12/18/16  Yes Laurey Moralealton S McLean, MD  lisinopril (PRINIVIL,ZESTRIL) 30 MG tablet TAKE ONE TABLET BY MOUTH  DAILY 12/11/16  Yes Laurey Moralealton S McLean, MD  oxyCODONE (OXY IR/ROXICODONE) 5 MG immediate release tablet Take 1-2 tablets (5-10 mg total) by mouth every 3 (three) hours as needed for breakthrough pain. 11/08/16  Yes Samson FredericBrian Swinteck, MD  warfarin (COUMADIN) 10 MG tablet TAKE AS DIRECTED BY  COUMADIN  CLINIC  (NEED  TO  BE  SEEN  IN  COUMADIN  CLINIC  JAN  5TH  SO   REST  OF  REFILL  WILL  BE  DONE  THEN) Patient taking differently: takes 15mg  on sat onlt, takes 10mg  all other days (in evenings) 12/11/16  Yes Laurey Moralealton S McLean, MD  atorvastatin (LIPITOR) 40 MG tablet Take 1 tablet (40 mg total) by mouth daily. Patient not taking: Reported on 02/11/2017 11/29/15   Laurey Moralealton S McLean, MD  furosemide (LASIX) 20 MG tablet Take 1 tablet (20 mg total) by mouth daily. Patient not taking: Reported on 02/11/2017 11/29/15   Laurey Moralealton S McLean, MD    Family History Family History  Problem Relation Age of Onset  . Lung cancer Mother   . Multiple sclerosis Sister     Social History Social History  Substance Use Topics  . Smoking status: Never Smoker  . Smokeless tobacco: Never Used  . Alcohol use Yes     Comment: occas      Allergies   Iodine; Naprosyn [naproxen]; and Shellfish allergy   Review of Systems Review of Systems  Neurological:       Ms change per wife  All other systems reviewed and are negative.    Physical Exam Updated Vital Signs BP (!) 171/110   Pulse (!) 59   Temp 98.9 F (37.2 C)   Resp 18   Ht 6\' 4"  (1.93 m)   Wt 300 lb (136.1 kg)   SpO2 100%   BMI 36.52 kg/m   Physical Exam  Constitutional: He appears well-developed and well-nourished.  HENT:  Head: Normocephalic and atraumatic.  Right Ear: External ear normal.  Left Ear: External ear normal.  Nose: Nose normal.  Mouth/Throat: Oropharynx is clear and moist.  Eyes: Conjunctivae and EOM are normal. Pupils are equal, round, and reactive to light.  Neck: Normal range of motion. Neck supple.  Cardiovascular: Normal rate, regular rhythm, normal heart sounds and intact distal pulses.   Pulmonary/Chest: Effort normal and breath sounds normal.  Abdominal: Soft. Bowel sounds are normal.  Musculoskeletal: Normal range of motion.  Neurological: He is alert.  Pt knows his name and [facility].  He does not know the month or year.  He is moving all 4 extremities.    Skin: Skin is warm.  Psychiatric: He has a normal mood and affect. His behavior is normal. Judgment and thought content normal. His speech is delayed.  Nursing note and vitals reviewed.    ED Treatments / Results  Labs (all labs ordered are listed, but only abnormal results are displayed) Labs Reviewed  PROTIME-INR - Abnormal; Notable for the following:       Result Value   Prothrombin Time 22.5 (*)  All other components within normal limits  COMPREHENSIVE METABOLIC PANEL - Abnormal; Notable for the following:    Calcium 8.8 (*)    Albumin 2.9 (*)    All other components within normal limits  APTT  CBC  DIFFERENTIAL  URINALYSIS, ROUTINE W REFLEX MICROSCOPIC  RAPID URINE DRUG SCREEN, HOSP PERFORMED  I-STAT TROPOININ, ED  CBG MONITORING, ED  I-STAT CHEM 8, ED    EKG  EKG Interpretation  Date/Time:  Tuesday February 11 2017 18:05:45 EST Ventricular Rate:  66 PR Interval:    QRS Duration: 96 QT Interval:  407 QTC Calculation: 427 R Axis:   0 Text Interpretation:  Sinus rhythm Borderline T abnormalities, inferior leads No significant change since last tracing Confirmed by [facility] MD, Neyda Durango ([id number]) on 02/11/2017 6:15:49 PM       Radiology Dg Chest 2 View  Result Date: 02/11/2017 CLINICAL DATA:  56 y/o  M; all altered mental status. EXAM: CHEST  2 VIEW COMPARISON:  None. FINDINGS: Stable normal cardiac silhouette. Aortic atherosclerosis with calcification. Low lung volumes accentuate pulmonary markings. With minor right basilar atelectasis. No focal consolidation. No pleural effusion or pneumothorax. Mild degenerative changes of thoracic spine. IMPRESSION: Low lung volumes. Minor right basilar atelectasis. No focal consolidation. Electronically Signed   By: Mitzi Hansen M.D.   On: 02/11/2017 19:56   Ct Head Wo Contrast  Result Date: 02/11/2017 CLINICAL DATA:  Altered mental status, left-sided weakness EXAM: CT HEAD WITHOUT CONTRAST TECHNIQUE: Contiguous axial  images were obtained from the base of the skull through the vertex without intravenous contrast. COMPARISON:  03/10/2012 FINDINGS: Brain: No acute territorial infarction, intracranial hemorrhage or focal mass lesion is visualized. Old encephalomalacia of the right occipital and posterior temporal lobes as before. Mild ex vacuo dilatation of the right lateral ventricle. Mild atrophy. No midline shift. Vascular: No hyperdense vessels.  No unexpected calcifications. Skull: Normal. Negative for fracture or focal lesion. Sinuses/Orbits: Mucosal thickening in the maxillary sinus. No acute orbital abnormality. Other: None IMPRESSION: 1. No CT evidence for acute intracranial abnormality. 2. Old encephalomalacia of the right posterior temporal and occipital lobes, unchanged. Electronically Signed   By: Jasmine Pang M.D.   On: 02/11/2017 20:05    Procedures Procedures (including critical care time)  Medications Ordered in ED Medications  metoprolol (LOPRESSOR) injection 5 mg (5 mg Intravenous Given 02/11/17 2033)     Initial Impression / Assessment and Plan / ED Course  I have reviewed the triage vital signs and the nursing notes.  Pertinent labs & imaging results that were available during my care of the patient were reviewed by me and considered in my medical decision making (see chart for details).  Pt's ms unchanged while here.    Pt d/w Dr. Toniann Fail (triad) for admission.  He requested that I speak with neurology, so I consulted Dr. Otelia Limes who did see pt in the ED.  Final Clinical Impressions(s) / ED Diagnoses   Final diagnoses:  Cerebrovascular accident (CVA), unspecified mechanism (HCC)  Essential hypertension  Encephalopathy  Anticoagulated on Coumadin    New Prescriptions New Prescriptions   No medications on file     Jacalyn Lefevre, MD 02/15/17 [phone]

## 2017-02-12 ENCOUNTER — Observation Stay (HOSPITAL_COMMUNITY): Payer: Medicaid Other

## 2017-02-12 ENCOUNTER — Encounter (HOSPITAL_COMMUNITY): Payer: Self-pay | Admitting: *Deleted

## 2017-02-12 DIAGNOSIS — G9340 Encephalopathy, unspecified: Secondary | ICD-10-CM

## 2017-02-12 DIAGNOSIS — I160 Hypertensive urgency: Secondary | ICD-10-CM | POA: Diagnosis present

## 2017-02-12 DIAGNOSIS — G454 Transient global amnesia: Secondary | ICD-10-CM

## 2017-02-12 DIAGNOSIS — D869 Sarcoidosis, unspecified: Secondary | ICD-10-CM

## 2017-02-12 DIAGNOSIS — F1410 Cocaine abuse, uncomplicated: Secondary | ICD-10-CM

## 2017-02-12 DIAGNOSIS — Z87442 Personal history of urinary calculi: Secondary | ICD-10-CM | POA: Diagnosis not present

## 2017-02-12 DIAGNOSIS — G8929 Other chronic pain: Secondary | ICD-10-CM | POA: Diagnosis present

## 2017-02-12 DIAGNOSIS — Z96642 Presence of left artificial hip joint: Secondary | ICD-10-CM | POA: Diagnosis present

## 2017-02-12 DIAGNOSIS — I674 Hypertensive encephalopathy: Secondary | ICD-10-CM | POA: Diagnosis present

## 2017-02-12 DIAGNOSIS — Z7901 Long term (current) use of anticoagulants: Secondary | ICD-10-CM | POA: Diagnosis not present

## 2017-02-12 DIAGNOSIS — Z820 Family history of epilepsy and other diseases of the nervous system: Secondary | ICD-10-CM | POA: Diagnosis not present

## 2017-02-12 DIAGNOSIS — Z886 Allergy status to analgesic agent status: Secondary | ICD-10-CM | POA: Diagnosis not present

## 2017-02-12 DIAGNOSIS — H5704 Mydriasis: Secondary | ICD-10-CM | POA: Diagnosis present

## 2017-02-12 DIAGNOSIS — R410 Disorientation, unspecified: Secondary | ICD-10-CM | POA: Diagnosis present

## 2017-02-12 DIAGNOSIS — I429 Cardiomyopathy, unspecified: Secondary | ICD-10-CM | POA: Diagnosis present

## 2017-02-12 DIAGNOSIS — I10 Essential (primary) hypertension: Secondary | ICD-10-CM | POA: Diagnosis not present

## 2017-02-12 DIAGNOSIS — Z91013 Allergy to seafood: Secondary | ICD-10-CM | POA: Diagnosis not present

## 2017-02-12 DIAGNOSIS — I253 Aneurysm of heart: Secondary | ICD-10-CM

## 2017-02-12 DIAGNOSIS — Z91041 Radiographic dye allergy status: Secondary | ICD-10-CM | POA: Diagnosis not present

## 2017-02-12 DIAGNOSIS — G9389 Other specified disorders of brain: Secondary | ICD-10-CM | POA: Diagnosis present

## 2017-02-12 DIAGNOSIS — Z8673 Personal history of transient ischemic attack (TIA), and cerebral infarction without residual deficits: Secondary | ICD-10-CM | POA: Diagnosis not present

## 2017-02-12 DIAGNOSIS — D860 Sarcoidosis of lung: Secondary | ICD-10-CM | POA: Diagnosis present

## 2017-02-12 DIAGNOSIS — Z79899 Other long term (current) drug therapy: Secondary | ICD-10-CM | POA: Diagnosis not present

## 2017-02-12 LAB — COMPREHENSIVE METABOLIC PANEL
ALT: 23 U/L (ref 17–63)
AST: 21 U/L (ref 15–41)
Albumin: 2.8 g/dL — ABNORMAL LOW (ref 3.5–5.0)
Alkaline Phosphatase: 94 U/L (ref 38–126)
Anion gap: 7 (ref 5–15)
BUN: 14 mg/dL (ref 6–20)
CALCIUM: 9.1 mg/dL (ref 8.9–10.3)
CHLORIDE: 107 mmol/L (ref 101–111)
CO2: 22 mmol/L (ref 22–32)
CREATININE: 1.04 mg/dL (ref 0.61–1.24)
GFR calc non Af Amer: >60 mL/min (ref >60)
GLUCOSE: 102 mg/dL — AB (ref 65–99)
Potassium: 3.7 mmol/L (ref 3.5–5.1)
SODIUM: 136 mmol/L (ref 135–145)
Total Bilirubin: 0.6 mg/dL (ref 0.3–1.2)
Total Protein: 6.1 g/dL — ABNORMAL LOW (ref 6.5–8.1)

## 2017-02-12 LAB — RAPID URINE DRUG SCREEN, HOSP PERFORMED
AMPHETAMINES: NOT DETECTED
Barbiturates: NOT DETECTED
Benzodiazepines: NOT DETECTED
Cocaine: POSITIVE — AB
OPIATES: NOT DETECTED
TETRAHYDROCANNABINOL: NOT DETECTED

## 2017-02-12 LAB — PROTIME-INR
INR: 2.17
PROTHROMBIN TIME: 24.5 s — AB (ref 11.4–15.2)

## 2017-02-12 LAB — CBC
HCT: 41.8 % (ref 39.0–52.0)
Hemoglobin: 13.6 g/dL (ref 13.0–17.0)
MCH: 27.8 pg (ref 26.0–34.0)
MCHC: 32.5 g/dL (ref 30.0–36.0)
MCV: 85.3 fL (ref 78.0–100.0)
PLATELETS: 175 10*3/uL (ref 150–400)
RBC: 4.9 MIL/uL (ref 4.22–5.81)
RDW: 13.4 % (ref 11.5–15.5)
WBC: 4.8 10*3/uL (ref 4.0–10.5)

## 2017-02-12 LAB — RPR: RPR Ser Ql: NONREACTIVE

## 2017-02-12 LAB — HIV ANTIBODY (ROUTINE TESTING W REFLEX): HIV SCREEN 4TH GENERATION: NONREACTIVE

## 2017-02-12 LAB — VITAMIN B12: VITAMIN B 12: 360 pg/mL (ref 180–914)

## 2017-02-12 LAB — TROPONIN I: Troponin I: <0.03 ng/mL (ref <0.03)

## 2017-02-12 LAB — TSH: TSH: 1.622 u[IU]/mL (ref 0.350–4.500)

## 2017-02-12 MED ORDER — WARFARIN SODIUM 5 MG PO TABS
10.0000 mg | ORAL_TABLET | Freq: Once | ORAL | Status: AC
  Administered 2017-02-12: 10 mg via ORAL
  Filled 2017-02-12: qty 2

## 2017-02-12 MED ORDER — VITAMIN B-12 100 MCG PO TABS
500.0000 ug | ORAL_TABLET | Freq: Every day | ORAL | Status: DC
  Administered 2017-02-12 – 2017-02-13 (×2): 500 ug via ORAL
  Filled 2017-02-12 (×2): qty 5

## 2017-02-12 NOTE — Progress Notes (Signed)
Got a call from MRI Staff that pt refused to do MRI of brain, pt earlier went down at 0215 and back to the unit at 0400, confused but follows command, though he said has had MRI before but verbalized cannot stand being inside the tube at this time. Will continue to monitor. Obasogie-Asidi, Frutoso Dimare Efe

## 2017-02-12 NOTE — Care Management Note (Signed)
Case Management Note  Patient Details  Name: Tony Howard MRN: [id number] Date of Birth: 08/06/1961  Subjective/Objective:   Pt in to r/o CVA. She is from home with spouse.                  Action/Plan: Plan is to return home when pt is medically ready. CM following for d/c needs, physician orders.  Expected Discharge Date:                  Expected Discharge Plan:  Home/Self Care  In-House Referral:     Discharge planning Services     Post Acute Care Choice:    Choice offered to:     DME Arranged:    DME Agency:     HH Arranged:    HH Agency:     Status of Service:  In process, will continue to follow  If discussed at Long Length of Stay Meetings, dates discussed:    Additional Comments:  Kermit BaloKelli F Vadis Slabach, RN 02/12/2017, 7:26 PM

## 2017-02-12 NOTE — Progress Notes (Signed)
ANTICOAGULATION CONSULT NOTE - Initial Consult  Pharmacy Consult for warfarin Indication: LV apical aneurysm   Allergies  Allergen Reactions  . Iodine Hives and Rash  . Naprosyn [Naproxen] Hives  . Shellfish Allergy Hives    Patient Measurements: Height: 6\' 4"  (193 cm) Weight: (!) 313 lb 7.9 oz (142.2 kg) IBW/kg (Calculated) : 86.8   Vital Signs: Temp: 97.3 F (36.3 C) (03/07 0800) Temp Source: Oral (03/07 0800) BP: 154/107 (03/07 0957) Pulse Rate: 66 (03/07 0800)  Labs:  Recent Labs  02/11/17 1811 02/11/17 1830 02/12/17 0830  HGB 13.4 13.9 13.6  HCT 40.9 41.0 41.8  PLT 173  --  175  APTT 33  --   --   LABPROT 22.5*  --  24.5*  INR 1.95  --  2.17  CREATININE 1.17 1.20 1.04  TROPONINI  --   --  <0.03    Estimated Creatinine Clearance: 123.7 mL/min (by C-G formula based on SCr of 1.04 mg/dL).  Assessment: 56 yo male admitted with altered mental status. Pt is on warfarin PTA for a history of a LV apical aneurysm. INR 2.17 this morning.   PTA warfarin regimen: 15 mg qSat, 10 mg all other days  Goal of Therapy:  INR 2-3 Monitor platelets by anticoagulation protocol: Yes    Plan:  -Coumadin 10mg  po x 1 today per PTA regimen -Daily INR  Bayard HuggerMei Teliyah Royal, PharmD, BCPS  Clinical Pharmacist  Pager: [phone]   02/12/2017,11:28 AM

## 2017-02-12 NOTE — Progress Notes (Signed)
EEG Completed; Results Pending  

## 2017-02-12 NOTE — Procedures (Signed)
ELECTROENCEPHALOGRAM REPORT  Date of Study: 02/12/2017  Patient's Name: Tony FerraraJustice M Buffone MRN: [id number] Date of Birth: 10-26-1961  Referring Provider: Dr. Midge MiniumArshad Kakrakandy  Clinical History: This is a 56 year old man with an episode of anterograde memory loss.  Medications: carvedilol (COREG) tablet 12.5 mg  docusate sodium (COLACE) capsule 100 mg  hydrALAZINE (APRESOLINE) injection 10 mg  hydrochlorothiazide (HYDRODIURIL) tablet 25 mg  lisinopril (PRINIVIL,ZESTRIL) tablet 30 mg  oxyCODONE (Oxy IR/ROXICODONE) immediate release tablet 5-10 mg  Warfarin - Pharmacist Dosing Inpatient   Technical Summary: A multichannel digital EEG recording measured by the international 10-20 system with electrodes applied with paste and impedances below 5000 ohms performed in our laboratory with EKG monitoring in a predominantly drowsy and asleep patient.  Hyperventilation was not performed. Photic stimulation was performed.  The digital EEG was referentially recorded, reformatted, and digitally filtered in a variety of bipolar and referential montages for optimal display.    Description: The patient is predominantly drowsy and asleep during the recording.  During brief period of wakefulness, there is a symmetric, medium voltage 8 Hz posterior dominant rhythm that attenuates with eye opening.  The record is symmetric.  During drowsiness and sleep, there is an increase in theta slowing of the background.  Vertex waves and symmetric sleep spindles were seen.  Photic stimulation did not elicit any abnormalities.  There were no epileptiform discharges or electrographic seizures seen.    EKG lead was unremarkable.  Impression: This predominantly drowsy and asleep EEG is normal.    Clinical Correlation: A normal EEG does not exclude a clinical diagnosis of epilepsy. Clinical correlation is advised.   Patrcia DollyKaren Iman Reinertsen, M.D.

## 2017-02-12 NOTE — Progress Notes (Signed)
[facility]                                                                              Patient Demographics  Tony Howard, is a 56 y.o. male, DOB - [birth year], ZOX:[id number]  Admit date - 02/11/2017   Admitting Physician Eduard Clos, MD  Outpatient Primary MD for the patient is Karie Chimera, MD  Outpatient specialists:   LOS - 0  days    Chief Complaint  Patient presents with  . Altered Mental Status    Pt last seen normal at 0630 this morning upon arrival home by his wife this evening was found to be confused pt is orient x2        Brief summary   Patient is a 56 year old male with prior history of LV aneurysm and stroke on Coumadin, sarcoidosis, hypertension was found to be confused. Patient presented with acute anterior grade memory loss, symptoms started on a 3/6 and last known normal was 11:30 AM when he spoke to his wife on the phone and sounded normal. When she returned from work he was confused asking the same questions over and over again. Patient was admitted and was seen by neurology.   Assessment & Plan    Principal Problem:   Transient global amnesia With hypertensive urgency and cocaine abuse - Neurology was consulted and recommended MRI of the brain and EEG -Urine drug screen showed cocaine.  - Patient refused MRI, agreeing to MRI with sedation I discussed in detail with neurology, Dr. Roxy Manns, who recommended to hold off the MRI today. If patient's mental status did not improve, to do MRI with oral sedation in a.m. - UA negative for UTI, CT head negative for acute CVA - B12 normal, follow folic acid, HIV   Active Problems:   Sarcoidosis (HCC) - Not on any medication at this time    Essential hypertension - Currently uncontrolled, also urine drug screen positive for cocaine - Continue HCTZ, lisinopril, IV hydralazine as needed    LV Apical Aneurysm -Continue Coumadin per pharmacy, INR therapeutic - 2-D echo 1/17 showed EF of  55%  Substance abuse - Urine drug screen positive for cocaine - Patient counseled strongly on cocaine cessation   Code Status: full  DVT Prophylaxis:Warfarin family Communication: Discussed in detail with the patient, all imaging results, lab results explained to the patient and wife   Disposition Plan:   Time Spent in minutes   25 minutes  Procedures:    Consultants:   Neurology   Antimicrobials:      Medications  Scheduled Meds: . carvedilol  12.5 mg Oral BID WC  . hydrochlorothiazide  25 mg Oral Daily  . lisinopril  30 mg Oral Daily  . warfarin  10 mg Oral ONCE-1800  . Warfarin - Pharmacist Dosing Inpatient   Does not apply q1800   Continuous Infusions: PRN Meds:.acetaminophen **OR** acetaminophen (TYLENOL) oral liquid 160 mg/5 mL **OR** acetaminophen, docusate sodium, hydrALAZINE, oxyCODONE   Antibiotics   Anti-infectives    None        Subjective:   [name] was seen and examined today.  Still somewhat confused, however  per wife, overall improving. No focal weakness.  Patient denies dizziness, chest pain, shortness of breath, abdominal pain, N/V/D/C. no fevers or chills.    Objective:   Vitals:   02/12/17 0600 02/12/17 0800 02/12/17 0957 02/12/17 1000  BP: (!) 151/103 (!) 133/105 (!) 154/107 (!) 154/107  Pulse: 68 66  71  Resp: 18 18  18   Temp: 97.7 F (36.5 C) 97.3 F (36.3 C)  98.1 F (36.7 C)  TempSrc: Oral Oral  Oral  SpO2: 96% 92%  96%  Weight:      Height:        Intake/Output Summary (Last 24 hours) at 02/12/17 1158 Last data filed at 02/12/17 0400  Gross per 24 hour  Intake                0 ml  Output              350 ml  Net             -350 ml     Wt Readings from Last 3 Encounters:  02/11/17 (!) 142.2 kg (313 lb 7.9 oz)  11/07/16 (!) 141.5 kg (312 lb)  09/20/16 (!) 142 kg (313 lb)     Exam  General: Alert and oriented x 3, NAD, No dysarthria,  occasionally tangential in his speech   HEENT:    Neck:  Supple, no JVD, no masses  Cardiovascular: S1 S2 auscultated, no rubs, murmurs or gallops. Regular rate and rhythm.  Respiratory: Clear to auscultation bilaterally, no wheezing, rales or rhonchi  Gastrointestinal: Soft, nontender, nondistended, + bowel sounds  Ext: no cyanosis clubbing or edema  Neuro: AAOx3, Cr N's II- XII. Strength 5/5 upper and lower extremities bilaterally  Skin: No rashes  Psych: Normal affect and demeanor, alert and oriented x3    Data Reviewed:  I have personally reviewed following labs and imaging studies  Micro Results No results found for this or any previous visit (from the past 240 hour(s)).  Radiology Reports Dg Chest 2 View  Result Date: 02/11/2017 CLINICAL DATA:  56 y/o  M; all altered mental status. EXAM: CHEST  2 VIEW COMPARISON:  None. FINDINGS: Stable normal cardiac silhouette. Aortic atherosclerosis with calcification. Low lung volumes accentuate pulmonary markings. With minor right basilar atelectasis. No focal consolidation. No pleural effusion or pneumothorax. Mild degenerative changes of thoracic spine. IMPRESSION: Low lung volumes. Minor right basilar atelectasis. No focal consolidation. Electronically Signed   By: Mitzi Hansen M.D.   On: 02/11/2017 19:56   Ct Head Wo Contrast  Result Date: 02/11/2017 CLINICAL DATA:  Altered mental status, left-sided weakness EXAM: CT HEAD WITHOUT CONTRAST TECHNIQUE: Contiguous axial images were obtained from the base of the skull through the vertex without intravenous contrast. COMPARISON:  03/10/2012 FINDINGS: Brain: No acute territorial infarction, intracranial hemorrhage or focal mass lesion is visualized. Old encephalomalacia of the right occipital and posterior temporal lobes as before. Mild ex vacuo dilatation of the right lateral ventricle. Mild atrophy. No midline shift. Vascular: No hyperdense vessels.  No unexpected calcifications. Skull: Normal. Negative for fracture or focal lesion.  Sinuses/Orbits: Mucosal thickening in the maxillary sinus. No acute orbital abnormality. Other: None IMPRESSION: 1. No CT evidence for acute intracranial abnormality. 2. Old encephalomalacia of the right posterior temporal and occipital lobes, unchanged. Electronically Signed   By: Jasmine Pang M.D.   On: 02/11/2017 20:05    Lab Data:  CBC:  Recent Labs Lab 02/11/17 1811 02/11/17 1830 02/12/17 0830  WBC 4.7  --  4.8  NEUTROABS 3.0  --   --   HGB 13.4 13.9 13.6  HCT 40.9 41.0 41.8  MCV 86.7  --  85.3  PLT 173  --  175   Basic Metabolic Panel:  Recent Labs Lab 02/11/17 1811 02/11/17 1830 02/12/17 0830  NA 137 142 136  K 4.3 4.1 3.7  CL 106 106 107  CO2 26  --  22  GLUCOSE 81 75 102*  BUN 15 18 14   CREATININE 1.17 1.20 1.04  CALCIUM 8.8*  --  9.1   GFR: Estimated Creatinine Clearance: 123.7 mL/min (by C-G formula based on SCr of 1.04 mg/dL). Liver Function Tests:  Recent Labs Lab 02/11/17 1811 02/12/17 0830  AST 24 21  ALT 22 23  ALKPHOS 96 94  BILITOT 0.6 0.6  PROT 6.5 6.1*  ALBUMIN 2.9* 2.8*   No results for input(s): LIPASE, AMYLASE in the last 168 hours. No results for input(s): AMMONIA in the last 168 hours. Coagulation Profile:  Recent Labs Lab 02/05/17 1429 02/11/17 1811 02/12/17 0830  INR 2.0 1.95 2.17   Cardiac Enzymes:  Recent Labs Lab 02/12/17 0830  TROPONINI <0.03   BNP (last 3 results) No results for input(s): PROBNP in the last 8760 hours. HbA1C: No results for input(s): HGBA1C in the last 72 hours. CBG:  Recent Labs Lab 02/11/17 1821  GLUCAP 74   Lipid Profile: No results for input(s): CHOL, HDL, LDLCALC, TRIG, CHOLHDL, LDLDIRECT in the last 72 hours. Thyroid Function Tests:  Recent Labs  02/11/17 2301  TSH 1.622   Anemia Panel:  Recent Labs  02/11/17 2301  VITAMINB12 360   Urine analysis:    Component Value Date/Time   COLORURINE YELLOW 02/11/2017 2303   APPEARANCEUR CLEAR 02/11/2017 2303   LABSPEC 1.015  02/11/2017 2303   PHURINE 7.0 02/11/2017 2303   GLUCOSEU NEGATIVE 02/11/2017 2303   HGBUR NEGATIVE 02/11/2017 2303   BILIRUBINUR NEGATIVE 02/11/2017 2303   KETONESUR NEGATIVE 02/11/2017 2303   PROTEINUR NEGATIVE 02/11/2017 2303   UROBILINOGEN 1.0 03/10/2012 0139   NITRITE NEGATIVE 02/11/2017 2303   LEUKOCYTESUR NEGATIVE 02/11/2017 2303     Dim Meisinger M.D. [facility] 02/12/2017, 11:58 AM  Pager: [phone] Between 7am to 7pm - call Pager - [phone]  After 7pm go to [email or web address] - password [email or web address]  Call night coverage person covering after 7pm

## 2017-02-12 NOTE — Progress Notes (Signed)
Neurology Progress Note  Subjective: This morning, he felt a lot better and acknowledged he consumed substances he should not have, specifically marijuana and cocaine. He and his friends wanted to relive their younger days while watching basketball and "mixing things with his drinks", and his wife was very disappointed with his decision-making. She recounted the events that precipitated his admission and a bottle of beer nearby.  Current Meds:   Current Facility-Administered Medications:  .  acetaminophen (TYLENOL) tablet 650 mg, 650 mg, Oral, Q4H PRN **OR** acetaminophen (TYLENOL) solution 650 mg, 650 mg, Per Tube, Q4H PRN **OR** acetaminophen (TYLENOL) suppository 650 mg, 650 mg, Rectal, Q4H PRN, Eduard Clos, MD .  carvedilol (COREG) tablet 12.5 mg, 12.5 mg, Oral, BID WC, Eduard Clos, MD, 12.5 mg at 02/12/17 0829 .  docusate sodium (COLACE) capsule 100 mg, 100 mg, Oral, Daily PRN, Eduard Clos, MD .  hydrALAZINE (APRESOLINE) injection 10 mg, 10 mg, Intravenous, Q4H PRN, Eduard Clos, MD .  hydrochlorothiazide (HYDRODIURIL) tablet 25 mg, 25 mg, Oral, Daily, Eduard Clos, MD, 25 mg at 02/12/17 0957 .  lisinopril (PRINIVIL,ZESTRIL) tablet 30 mg, 30 mg, Oral, Daily, Eduard Clos, MD, 30 mg at 02/12/17 0957 .  oxyCODONE (Oxy IR/ROXICODONE) immediate release tablet 5-10 mg, 5-10 mg, Oral, Q3H PRN, Eduard Clos, MD .  warfarin (COUMADIN) tablet 10 mg, 10 mg, Oral, ONCE-1800, Robinette Haines, RPH .  Warfarin - Pharmacist Dosing Inpatient, , Does not apply, q1800, Darl Householder Masters, RPH  Objective:  Temp:  [97.3 F (36.3 C)-98.9 F (37.2 C)] 97.3 F (36.3 C) (03/07 0800) Pulse Rate:  [56-76] 66 (03/07 0800) Resp:  [12-19] 18 (03/07 0800) BP: (132-175)/(92-125) 154/107 (03/07 0957) SpO2:  [92 %-100 %] 92 % (03/07 0800) Weight:  [300 lb (136.1 kg)-313 lb 7.9 oz (142.2 kg)] 313 lb 7.9 oz (142.2 kg) (03/06 2250)  General: Middle aged African American man  in NAD. Alert, oriented x4. Speech is clear without dysarthria. Affect is bright. Comportment is normal.  HEENT: Neck is supple without lymphadenopathy. Mucous membranes are moist and the oropharynx is clear. Sclerae are anicteric. There is no conjunctival injection.  CV: Regular, no murmur.   Lungs: CTAB  Extremities: No C/C/E. Neuro: MS: As noted above.  CN: Pupils are equal and reactive from 3-->2 mm bilaterally. EOMI, no nystagmus. Facial sensation is intact to light touch. Face is symmetric at rest with normal strength and mobility. Hearing is intact to conversational voice. Voice is normal in tone and quality. Palate elevates symmetrically. Uvula is midline. Bilateral SCM and trapezii are 5/5. Tongue is midline with normal bulk and mobility.  Motor: Normal bulk, tone, and strength throughout. No tremor or other abnormal movements are observed.  Sensation: Intact to light touch bilaterally. DTRs: 2+ on the LLE/LUE though 3+ on the RLE/RUE. Toes are downgoing bilaterally.  Coordination: Finger-to-nose are without dysmetria bilaterally.  Gait: Deferred.   Labs: Lab Results  Component Value Date   WBC 4.8 02/12/2017   HGB 13.6 02/12/2017   HCT 41.8 02/12/2017   PLT 175 02/12/2017   GLUCOSE 102 (H) 02/12/2017   CHOL 186 02/06/2016   TRIG 141 02/06/2016   HDL 43 02/06/2016   LDLDIRECT 143.9 07/23/2013   LDLCALC 115 02/06/2016   ALT 23 02/12/2017   AST 21 02/12/2017   NA 136 02/12/2017   K 3.7 02/12/2017   CL 107 02/12/2017   CREATININE 1.04 02/12/2017   BUN 14 02/12/2017   CO2 22 02/12/2017  TSH 1.622 02/11/2017   INR 2.17 02/12/2017   CBC Latest Ref Rng & Units 02/12/2017 02/11/2017 02/11/2017  WBC 4.0 - 10.5 K/uL 4.8 - 4.7  Hemoglobin 13.0 - 17.0 g/dL 16.113.6 09.613.9 04.513.4  Hematocrit 39.0 - 52.0 % 41.8 41.0 40.9  Platelets 150 - 400 K/uL 175 - 173    No results found for: HGBA1C Lab Results  Component Value Date   ALT 23 02/12/2017   AST 21 02/12/2017   ALKPHOS 94 02/12/2017    BILITOT 0.6 02/12/2017    Radiology: MRI pending.  Other diagnostic studies: EEG pending. B12 360. TSH 1.622.  A/P:  Mr. Grace IsaacWatts is a 56 year old man with sarcoidosis, h/o CVA, hypertension, chronic anticoagulation for h/o LV aneurysm hospitalized for acute hypertensive encephalopathy.   Acute hypertensive encephalopathy: Likely in the setting of recent cocaine use given how rapidly his memory has resolved though he shares features of TGA. Per his wife, his initial questioning seemed more consistent with confusion versus perseverating on the same thing, and she found his blood pressure to be markedly elevated. No focal deficits on exam to suggest CVA though hypertension could be related to acute infarct in the setting of recent cocaine use.  -Continue anti-hypertensive therapy with BP <140/90 -Follow-up EEG and brain MRI  -Continue B12 supplementation  Needs outpatient neurology follow-up?: No  This was discussed with the patient and wife. Education was provided on the diagnosis and expected evaluation and treatment. They are in agreement with the plan as noted. They were given the opportunity to ask any questions and these were addressed to their satisfaction.   Heywood Ilesushil Lancer Thurner, PGY3 Internal Medicine Pager: [phone]

## 2017-02-13 DIAGNOSIS — F1410 Cocaine abuse, uncomplicated: Secondary | ICD-10-CM

## 2017-02-13 LAB — FOLATE RBC
FOLATE, RBC: 1129 ng/mL (ref >498)
Folate, Hemolysate: 469.6 ng/mL
Hematocrit: 41.6 % (ref 37.5–51.0)

## 2017-02-13 LAB — BASIC METABOLIC PANEL
ANION GAP: 8 (ref 5–15)
BUN: 12 mg/dL (ref 6–20)
CO2: 24 mmol/L (ref 22–32)
Calcium: 9.4 mg/dL (ref 8.9–10.3)
Chloride: 106 mmol/L (ref 101–111)
Creatinine, Ser: 1.17 mg/dL (ref 0.61–1.24)
GFR calc Af Amer: >60 mL/min (ref >60)
Glucose, Bld: 107 mg/dL — ABNORMAL HIGH (ref 65–99)
POTASSIUM: 3.6 mmol/L (ref 3.5–5.1)
SODIUM: 138 mmol/L (ref 135–145)

## 2017-02-13 LAB — PROTIME-INR
INR: 2.65
Prothrombin Time: 28.8 seconds — ABNORMAL HIGH (ref 11.4–15.2)

## 2017-02-13 LAB — CBC
HCT: 43.3 % (ref 39.0–52.0)
Hemoglobin: 14.3 g/dL (ref 13.0–17.0)
MCH: 28.4 pg (ref 26.0–34.0)
MCHC: 33 g/dL (ref 30.0–36.0)
MCV: 85.9 fL (ref 78.0–100.0)
PLATELETS: 191 10*3/uL (ref 150–400)
RBC: 5.04 MIL/uL (ref 4.22–5.81)
RDW: 13.4 % (ref 11.5–15.5)
WBC: 4.7 10*3/uL (ref 4.0–10.5)

## 2017-02-13 MED ORDER — LISINOPRIL 30 MG PO TABS: 30.0000 mg | ORAL_TABLET | Freq: Every day | ORAL | 4 refills | Status: DC

## 2017-02-13 MED ORDER — HYDROCHLOROTHIAZIDE 25 MG PO TABS: 25.0000 mg | ORAL_TABLET | Freq: Every day | ORAL | 3 refills | Status: DC

## 2017-02-13 MED ORDER — CARVEDILOL 12.5 MG PO TABS: 12.5000 mg | ORAL_TABLET | Freq: Two times a day (BID) | ORAL | 4 refills | Status: DC

## 2017-02-13 MED ORDER — CYANOCOBALAMIN 500 MCG PO TABS: 500.0000 ug | ORAL_TABLET | Freq: Every day | ORAL | 0 refills | Status: DC

## 2017-02-13 MED ORDER — WARFARIN SODIUM 5 MG PO TABS: 10.0000 mg | ORAL_TABLET | Freq: Once | ORAL | Status: DC

## 2017-02-13 NOTE — Discharge Summary (Signed)
Physician Discharge Summary   Patient ID: Tony Howard MRN: [id number] DOB/AGE: May 14, 1961 56 y.o.  Admit date: 02/11/2017 Discharge date: 02/13/2017  Primary Care Physician:  Karie Chimera, MD  Discharge Diagnoses:    . Acute encephalopathy . Transient global amnesia . Sarcoidosis (HCC) . LV Apical Aneurysm . Essential hypertension   Consults:  neurology  Recommendations for Outpatient Follow-up:  1. Please repeat CBC/BMET at next visit 2. Please follow blood/urine cultures till final   DIET: Heart healthy diet    Allergies:   Allergies  Allergen Reactions  . Iodine Hives and Rash  . Naprosyn [Naproxen] Hives  . Shellfish Allergy Hives     DISCHARGE MEDICATIONS: Current Discharge Medication List    START taking these medications   Details  cyanocobalamin 500 MCG tablet Take 1 tablet (500 mcg total) by mouth daily. Qty: 30 tablet, Refills: 0      CONTINUE these medications which have CHANGED   Details  carvedilol (COREG) 12.5 MG tablet Take 1 tablet (12.5 mg total) by mouth 2 (two) times daily with a meal. Qty: 60 tablet, Refills: 4    hydrochlorothiazide (HYDRODIURIL) 25 MG tablet Take 1 tablet (25 mg total) by mouth daily. Qty: 30 tablet, Refills: 3    lisinopril (PRINIVIL,ZESTRIL) 30 MG tablet Take 1 tablet (30 mg total) by mouth daily. Qty: 30 tablet, Refills: 4      CONTINUE these medications which have NOT CHANGED   Details  docusate sodium (COLACE) 100 MG capsule Take 1 capsule (100 mg total) by mouth 2 (two) times daily. Qty: 10 capsule, Refills: 0    oxyCODONE (OXY IR/ROXICODONE) 5 MG immediate release tablet Take 1-2 tablets (5-10 mg total) by mouth every 3 (three) hours as needed for breakthrough pain. Qty: 90 tablet, Refills: 0    warfarin (COUMADIN) 10 MG tablet TAKE AS DIRECTED BY  COUMADIN  CLINIC  (NEED  TO  BE  SEEN  IN  COUMADIN  CLINIC  JAN  5TH  SO  REST  OF  REFILL  WILL  BE  DONE  THEN) Qty: 20 tablet, Refills: 0     atorvastatin (LIPITOR) 40 MG tablet Take 1 tablet (40 mg total) by mouth daily. Qty: 30 tablet, Refills: 11         Brief H and P: Patient is a 56 year old male with prior history of LV aneurysm and stroke on Coumadin, sarcoidosis, hypertension was found to be confused. Patient presented with acute anterior grade memory loss, symptoms started on a 3/6 and last known normal was 11:30 AM when he spoke to his wife on the phone and sounded normal. When she returned from work he was confused asking the same questions over and over again. Patient was admitted and was seen by neurology.  Hospital Course:   Transient global amnesia secondary to hypertensive urgency and cocaine abuse - Resolved, back to baseline, likely in the setting of recent cocaine use and hypertensive urgency. No other focal neurological deficits suggesting CVA. - Neurology was consulted and initially recommended MRI of the brain and EEG - Urine drug screen showed cocaine.  - Patient refused MRI. UA negative for UTI, CT head negative for acute CVA - B12 low normal 360, patient was started on B12 replacement -  HIV negative  - EEG was negative    Sarcoidosis (HCC) - Not on any medication at this time    Essential hypertension - Currently uncontrolled, also urine drug screen positive for cocaine - Continue HCTZ, lisinopril  LV Apical Aneurysm -Continue Coumadin per pharmacy, INR therapeutic - 2-D echo 1/17 showed EF of 55%  Substance abuse - Urine drug screen positive for cocaine - Patient counseled strongly on cocaine cessation  Day of Discharge BP 135/81 (BP Location: Left Arm)   Pulse 84   Temp 97.8 F (36.6 C) (Oral)   Resp 18   Ht 6\' 4"  (1.93 m)   Wt (!) 142.2 kg (313 lb 7.9 oz)   SpO2 96%   BMI 38.16 kg/m   Physical Exam: General: Alert and awake oriented x3 not in any acute distress. HEENT: anicteric sclera, pupils reactive to light and accommodation CVS: S1-S2 clear no murmur rubs or  gallops Chest: clear to auscultation bilaterally, no wheezing rales or rhonchi Abdomen: soft nontender, nondistended, normal bowel sounds Extremities: no cyanosis, clubbing or edema noted bilaterally Neuro: Cranial nerves II-XII intact, no focal neurological deficits   The results of significant diagnostics from this hospitalization (including imaging, microbiology, ancillary and laboratory) are listed below for reference.    LAB RESULTS: Basic Metabolic Panel:  Recent Labs Lab 02/12/17 0830 02/13/17 0337  NA 136 138  K 3.7 3.6  CL 107 106  CO2 22 24  GLUCOSE 102* 107*  BUN 14 12  CREATININE 1.04 1.17  CALCIUM 9.1 9.4   Liver Function Tests:  Recent Labs Lab 02/11/17 1811 02/12/17 0830  AST 24 21  ALT 22 23  ALKPHOS 96 94  BILITOT 0.6 0.6  PROT 6.5 6.1*  ALBUMIN 2.9* 2.8*   No results for input(s): LIPASE, AMYLASE in the last 168 hours. No results for input(s): AMMONIA in the last 168 hours. CBC:  Recent Labs Lab 02/11/17 1811  02/12/17 0830 02/13/17 0337  WBC 4.7  --  4.8 4.7  NEUTROABS 3.0  --   --   --   HGB 13.4  < > 13.6 14.3  HCT 40.9  < > 41.8 43.3  MCV 86.7  --  85.3 85.9  PLT 173  --  175 191  < > = values in this interval not displayed. Cardiac Enzymes:  Recent Labs Lab 02/12/17 0830  TROPONINI <0.03   BNP: Invalid input(s): POCBNP CBG:  Recent Labs Lab 02/11/17 1821  GLUCAP 74    Significant Diagnostic Studies:  Dg Chest 2 View  Result Date: 02/11/2017 CLINICAL DATA:  56 y/o  M; all altered mental status. EXAM: CHEST  2 VIEW COMPARISON:  None. FINDINGS: Stable normal cardiac silhouette. Aortic atherosclerosis with calcification. Low lung volumes accentuate pulmonary markings. With minor right basilar atelectasis. No focal consolidation. No pleural effusion or pneumothorax. Mild degenerative changes of thoracic spine. IMPRESSION: Low lung volumes. Minor right basilar atelectasis. No focal consolidation. Electronically Signed   By:  Mitzi HansenLance  Furusawa-Stratton M.D.   On: 02/11/2017 19:56   Ct Head Wo Contrast  Result Date: 02/11/2017 CLINICAL DATA:  Altered mental status, left-sided weakness EXAM: CT HEAD WITHOUT CONTRAST TECHNIQUE: Contiguous axial images were obtained from the base of the skull through the vertex without intravenous contrast. COMPARISON:  03/10/2012 FINDINGS: Brain: No acute territorial infarction, intracranial hemorrhage or focal mass lesion is visualized. Old encephalomalacia of the right occipital and posterior temporal lobes as before. Mild ex vacuo dilatation of the right lateral ventricle. Mild atrophy. No midline shift. Vascular: No hyperdense vessels.  No unexpected calcifications. Skull: Normal. Negative for fracture or focal lesion. Sinuses/Orbits: Mucosal thickening in the maxillary sinus. No acute orbital abnormality. Other: None IMPRESSION: 1. No CT evidence for acute intracranial abnormality. 2.  Old encephalomalacia of the right posterior temporal and occipital lobes, unchanged. Electronically Signed   By: Jasmine Pang M.D.   On: 02/11/2017 20:05    2D ECHO:   Disposition and Follow-up: Discharge Instructions    Diet - low sodium heart healthy    Complete by:  As directed    Increase activity slowly    Complete by:  As directed        DISPOSITION: Home   DISCHARGE FOLLOW-UP Follow-up Information    REESE,BETTI D, MD. Schedule an appointment as soon as possible for a visit in 2 week(s).   Specialty:  Family Medicine Contact information: [address] [place] [address] [phone]            Time spent on Discharge: 25 minutes  Signed:   RAI,RIPUDEEP M.D. [facility] 02/13/2017, 12:53 PM Pager: [phone]

## 2017-02-13 NOTE — Progress Notes (Signed)
Neurology Progress Note  Subjective: This morning, he was more lucid and coherent. He again expressed regret regarding his poor-decision making which prompted his admission but resolved to do better.   Current Meds:   Current Facility-Administered Medications:  .  acetaminophen (TYLENOL) tablet 650 mg, 650 mg, Oral, Q4H PRN **OR** acetaminophen (TYLENOL) solution 650 mg, 650 mg, Per Tube, Q4H PRN **OR** acetaminophen (TYLENOL) suppository 650 mg, 650 mg, Rectal, Q4H PRN, Eduard ClosArshad N Kakrakandy, MD .  carvedilol (COREG) tablet 12.5 mg, 12.5 mg, Oral, BID WC, Eduard ClosArshad N Kakrakandy, MD, 12.5 mg at 02/13/17 0930 .  docusate sodium (COLACE) capsule 100 mg, 100 mg, Oral, Daily PRN, Eduard ClosArshad N Kakrakandy, MD .  hydrALAZINE (APRESOLINE) injection 10 mg, 10 mg, Intravenous, Q4H PRN, Eduard ClosArshad N Kakrakandy, MD .  hydrochlorothiazide (HYDRODIURIL) tablet 25 mg, 25 mg, Oral, Daily, Eduard ClosArshad N Kakrakandy, MD, 25 mg at 02/13/17 0929 .  lisinopril (PRINIVIL,ZESTRIL) tablet 30 mg, 30 mg, Oral, Daily, Eduard ClosArshad N Kakrakandy, MD, 30 mg at 02/13/17 0929 .  oxyCODONE (Oxy IR/ROXICODONE) immediate release tablet 5-10 mg, 5-10 mg, Oral, Q3H PRN, Eduard ClosArshad N Kakrakandy, MD, 5 mg at 02/12/17 2302 .  vitamin B-12 (CYANOCOBALAMIN) tablet 500 mcg, 500 mcg, Oral, Daily, Ripudeep K Rai, MD, 500 mcg at 02/13/17 0930 .  warfarin (COUMADIN) tablet 10 mg, 10 mg, Oral, ONCE-1800, Bertram MillardMichael A Maccia, RPH .  Warfarin - Pharmacist Dosing Inpatient, , Does not apply, q1800, Darl HouseholderAlison M Masters, RPH  Objective:  Temp:  [97.8 F (36.6 C)-98.4 F (36.9 C)] 97.8 F (36.6 C) (03/08 1004) Pulse Rate:  [66-84] 84 (03/08 1004) Resp:  [16-20] 18 (03/08 1004) BP: (115-148)/(81-99) 135/81 (03/08 1004) SpO2:  [96 %-100 %] 96 % (03/08 1004)  General: Middle aged African American man in NAD. Alert, oriented x4. Speech is clear without dysarthria. Affect is bright. Comportment is normal. Remembered three objects on delay recall test though required cues for the  last two objects. Able to recount the days of the week beginning with today. HEENT: Neck is supple without lymphadenopathy. Mucous membranes are moist and the oropharynx is clear. Sclerae are anicteric. There is no conjunctival injection.  CV: Regular, no murmur.   Lungs: CTAB in the anterior lung fields. Extremities: No C/C/E. Neuro: MS: As noted above.  CN: Pupils are equal and reactive from 3-->2 mm bilaterally. EOMI, no nystagmus. Facial sensation is intact to light touch. Face is symmetric at rest with normal strength and mobility. Hearing is intact to conversational voice. Voice is normal in tone and quality. Palate elevates symmetrically. Uvula is midline. Bilateral SCM and trapezii are 5/5. Tongue is midline with normal bulk and mobility.  Motor: Normal bulk, tone, and strength throughout. No pronator drift. No tremor or other abnormal movements are observed.  Sensation: Intact to light touch, pinprick, vibration, and joint position.  DTRs: 3+ left brachial/patellar reflexes, 2+ right brachial/patellar reflexes. Toes are downgoing bilaterally. No pathological reflexes.  Coordination: Finger-to-nose and heel-to-shin are without dysmetria bilaterally.  Gait: Deferred. .   Labs: Lab Results  Component Value Date   WBC 4.7 02/13/2017   HGB 14.3 02/13/2017   HCT 43.3 02/13/2017   PLT 191 02/13/2017   GLUCOSE 107 (H) 02/13/2017   CHOL 186 02/06/2016   TRIG 141 02/06/2016   HDL 43 02/06/2016   LDLDIRECT 143.9 07/23/2013   LDLCALC 115 02/06/2016   ALT 23 02/12/2017   AST 21 02/12/2017   NA 138 02/13/2017   K 3.6 02/13/2017   CL 106 02/13/2017  CREATININE 1.17 02/13/2017   BUN 12 02/13/2017   CO2 24 02/13/2017   TSH 1.622 02/11/2017   INR 2.65 02/13/2017   CBC Latest Ref Rng & Units 02/13/2017 02/12/2017 02/11/2017  WBC 4.0 - 10.5 K/uL 4.7 4.8 -  Hemoglobin 13.0 - 17.0 g/dL 96.0 45.4 09.8  Hematocrit 39.0 - 52.0 % 43.3 41.8 41.0  Platelets 150 - 400 K/uL 191 175 -    No results  found for: HGBA1C Lab Results  Component Value Date   ALT 23 02/12/2017   AST 21 02/12/2017   ALKPHOS 94 02/12/2017   BILITOT 0.6 02/12/2017    A/P:  Mr. Spruce is a 56 year old man with sarcoidosis, h/o CVA, hypertension, chronic anticoagulation for h/o LV aneurysm hospitalized for acute hypertensive encephalopathy.   Acute hypertensive encephalopathy: Resolved. Continue anti-hypertensive therapy. Encouraged him to abstain from illicit substance use to which he acknowledged understanding.  Needs outpatient neurology follow-up?: No  This was discussed with the patient. Education was provided on the diagnosis and expected evaluation and treatment. They are in agreement with the plan as noted. They were given the opportunity to ask any questions and these were addressed to their satisfaction.   Heywood Iles, PGY3 Internal Medicine Pager: [phone]

## 2017-02-13 NOTE — Progress Notes (Signed)
Patient refused to have bed in lowest position.Patient educated on not getting out of bed without assistance from staff. Bed alarm on and patient contracts for safety.Will continue to monitor.

## 2017-02-13 NOTE — Progress Notes (Signed)
ANTICOAGULATION CONSULT NOTE - Initial Consult  Pharmacy Consult for warfarin Indication: LV apical aneurysm   Allergies  Allergen Reactions  . Iodine Hives and Rash  . Naprosyn [Naproxen] Hives  . Shellfish Allergy Hives    Patient Measurements: Height: 6\' 4"  (193 cm) Weight: (!) 313 lb 7.9 oz (142.2 kg) IBW/kg (Calculated) : 86.8   Vital Signs: Temp: 97.8 F (36.6 C) (03/08 1004) Temp Source: Oral (03/08 1004) BP: 135/81 (03/08 1004) Pulse Rate: 84 (03/08 1004)  Labs:  Recent Labs  02/11/17 1811 02/11/17 1830 02/12/17 0830 02/13/17 0337  HGB 13.4 13.9 13.6 14.3  HCT 40.9 41.0 41.8 43.3  PLT 173  --  175 191  APTT 33  --   --   --   LABPROT 22.5*  --  24.5* 28.8*  INR 1.95  --  2.17 2.65  CREATININE 1.17 1.20 1.04 1.17  TROPONINI  --   --  <0.03  --     Estimated Creatinine Clearance: 110 mL/min (by C-G formula based on SCr of 1.17 mg/dL).  Assessment: CC/HPI: Altered mental status  PMH: LV aneurysm, stroke on warfarin, sarcoidosis, HTN  Anticoag: warfarin for hx of LV apical aneurysm. Admit INR 1.95 INR 2.65 today  PTA warfarin: 15 mg tue/sat, 10 mg all other days  Renal: SCr 1.17   Heme/Onc: H&H 14.3/43.3, Plt 191  Goal of Therapy:  INR 2-3 Monitor platelets by anticoagulation protocol: Yes  Plan:  -Warfarin 10 mg x 1 -Daily INR, CBC q72h -Monitor s/sx of bleeding  Isaac BlissMichael Naomi Fitton, PharmD, BCPS, BCCCP Clinical Pharmacist Clinical phone for 02/13/2017 from 7a-3:30p: [phone] If after 3:30p, please call main pharmacy at: [phone] 02/13/2017 10:26 AM

## 2017-04-17 ENCOUNTER — Encounter (INDEPENDENT_AMBULATORY_CARE_PROVIDER_SITE_OTHER): Payer: Self-pay

## 2017-04-17 ENCOUNTER — Ambulatory Visit (INDEPENDENT_AMBULATORY_CARE_PROVIDER_SITE_OTHER): Payer: Medicaid Other | Admitting: *Deleted

## 2017-04-17 DIAGNOSIS — I253 Aneurysm of heart: Secondary | ICD-10-CM

## 2017-04-17 DIAGNOSIS — Z7902 Long term (current) use of antithrombotics/antiplatelets: Secondary | ICD-10-CM | POA: Diagnosis not present

## 2017-04-17 LAB — POCT INR: INR: 3.2

## 2017-04-17 MED ORDER — WARFARIN SODIUM 10 MG PO TABS: ORAL_TABLET | ORAL | 0 refills | Status: DC

## 2017-04-22 ENCOUNTER — Encounter: Payer: Self-pay | Admitting: Physician Assistant

## 2017-04-23 ENCOUNTER — Ambulatory Visit: Payer: Medicaid Other | Admitting: Physician Assistant

## 2017-04-25 MED FILL — WARFARIN NA 10 MG TAB: 10 | 30 days supply | Qty: 40 | Fill #0

## 2017-05-01 ENCOUNTER — Encounter: Payer: Self-pay | Admitting: Physician Assistant

## 2017-05-08 MED FILL — CARVEDILOL 12.5 MG TABLET: 12.5 | 30 days supply | Qty: 60 | Fill #0

## 2017-08-19 ENCOUNTER — Ambulatory Visit (INDEPENDENT_AMBULATORY_CARE_PROVIDER_SITE_OTHER): Payer: Medicaid Other

## 2017-08-19 ENCOUNTER — Encounter (INDEPENDENT_AMBULATORY_CARE_PROVIDER_SITE_OTHER): Payer: Self-pay

## 2017-08-19 DIAGNOSIS — Z7902 Long term (current) use of antithrombotics/antiplatelets: Secondary | ICD-10-CM

## 2017-08-19 DIAGNOSIS — I253 Aneurysm of heart: Secondary | ICD-10-CM

## 2017-08-19 LAB — POCT INR: INR: 1.3

## 2017-08-19 MED ORDER — WARFARIN SODIUM 10 MG PO TABS: ORAL_TABLET | ORAL | 0 refills | Status: DC

## 2017-08-21 MED FILL — WARFARIN NA 10 MG TAB: 10 | 30 days supply | Qty: 40 | Fill #0

## 2017-09-04 MED FILL — CARVEDILOL 12.5 MG TABS: 12.5 | 30 days supply | Qty: 60 | Fill #1

## 2017-09-12 ENCOUNTER — Ambulatory Visit (INDEPENDENT_AMBULATORY_CARE_PROVIDER_SITE_OTHER): Payer: Medicaid Other | Admitting: *Deleted

## 2017-09-12 DIAGNOSIS — Z5181 Encounter for therapeutic drug level monitoring: Secondary | ICD-10-CM

## 2017-09-12 DIAGNOSIS — Z7902 Long term (current) use of antithrombotics/antiplatelets: Secondary | ICD-10-CM

## 2017-09-12 DIAGNOSIS — I253 Aneurysm of heart: Secondary | ICD-10-CM | POA: Diagnosis not present

## 2017-09-12 LAB — POCT INR: INR: 4.5

## 2017-09-12 MED ORDER — WARFARIN SODIUM 10 MG PO TABS: ORAL_TABLET | ORAL | 0 refills | Status: DC

## 2017-11-07 ENCOUNTER — Ambulatory Visit (INDEPENDENT_AMBULATORY_CARE_PROVIDER_SITE_OTHER): Payer: Medicaid Other | Admitting: *Deleted

## 2017-11-07 DIAGNOSIS — Z7902 Long term (current) use of antithrombotics/antiplatelets: Secondary | ICD-10-CM

## 2017-11-07 DIAGNOSIS — I253 Aneurysm of heart: Secondary | ICD-10-CM | POA: Diagnosis not present

## 2017-11-07 DIAGNOSIS — Z5181 Encounter for therapeutic drug level monitoring: Secondary | ICD-10-CM

## 2017-11-07 LAB — POCT INR: INR: 2.2

## 2017-11-07 MED ORDER — WARFARIN SODIUM 10 MG PO TABS: ORAL_TABLET | ORAL | 2 refills | Status: DC

## 2017-11-07 NOTE — Patient Instructions (Signed)
Continue taking the same dosage 10mg  daily except 15mg  on Tuesdays and Saturdays.   Recheck in 6 weeks.

## 2017-12-12 ENCOUNTER — Other Ambulatory Visit: Payer: Self-pay | Admitting: Cardiology

## 2017-12-16 NOTE — Telephone Encounter (Signed)
Ok to approve #30 days with 1 refill. Tereso NewcomerScott Weaver, PA-C    12/16/2017 5:20 PM

## 2017-12-16 NOTE — Telephone Encounter (Signed)
I will route to Tereso NewcomerScott Weaver, PA for further review.

## 2017-12-16 NOTE — Telephone Encounter (Signed)
Pt pharmacy is requesting a refill on carvedilol and lisinopril, pt has an appt on 01/07/18 with Lorin PicketScott, pt has not been seen since 09/2016 by Dr. Shirlee LatchMclean. Would you like to refill this medication until appt time. Please advise

## 2018-01-06 ENCOUNTER — Ambulatory Visit: Payer: Medicaid Other | Admitting: Physician Assistant

## 2018-01-07 ENCOUNTER — Ambulatory Visit (INDEPENDENT_AMBULATORY_CARE_PROVIDER_SITE_OTHER): Payer: Medicaid Other | Admitting: *Deleted

## 2018-01-07 ENCOUNTER — Ambulatory Visit (INDEPENDENT_AMBULATORY_CARE_PROVIDER_SITE_OTHER): Payer: Medicaid Other | Admitting: Physician Assistant

## 2018-01-07 ENCOUNTER — Ambulatory Visit: Payer: Medicaid Other | Admitting: Physician Assistant

## 2018-01-07 ENCOUNTER — Encounter: Payer: Self-pay | Admitting: Physician Assistant

## 2018-01-07 VITALS — BP 152/100 | HR 64 | Ht 75.0 in | Wt 319.8 lb

## 2018-01-07 DIAGNOSIS — Z7902 Long term (current) use of antithrombotics/antiplatelets: Secondary | ICD-10-CM | POA: Diagnosis not present

## 2018-01-07 DIAGNOSIS — I10 Essential (primary) hypertension: Secondary | ICD-10-CM

## 2018-01-07 DIAGNOSIS — Z5181 Encounter for therapeutic drug level monitoring: Secondary | ICD-10-CM

## 2018-01-07 DIAGNOSIS — Z7901 Long term (current) use of anticoagulants: Secondary | ICD-10-CM | POA: Diagnosis not present

## 2018-01-07 DIAGNOSIS — I2510 Atherosclerotic heart disease of native coronary artery without angina pectoris: Secondary | ICD-10-CM | POA: Diagnosis not present

## 2018-01-07 DIAGNOSIS — I253 Aneurysm of heart: Secondary | ICD-10-CM

## 2018-01-07 DIAGNOSIS — D869 Sarcoidosis, unspecified: Secondary | ICD-10-CM | POA: Diagnosis not present

## 2018-01-07 DIAGNOSIS — N529 Male erectile dysfunction, unspecified: Secondary | ICD-10-CM | POA: Diagnosis not present

## 2018-01-07 LAB — POCT INR: INR: 1.3

## 2018-01-07 MED ORDER — WARFARIN SODIUM 10 MG PO TABS: ORAL_TABLET | ORAL | 0 refills | Status: DC

## 2018-01-07 MED ORDER — HYDROCHLOROTHIAZIDE 25 MG PO TABS: 25.0000 mg | ORAL_TABLET | Freq: Every day | ORAL | 3 refills | Status: DC

## 2018-01-07 MED ORDER — CARVEDILOL 12.5 MG PO TABS: 6.2500 mg | ORAL_TABLET | Freq: Two times a day (BID) | ORAL | 3 refills | Status: DC

## 2018-01-07 MED ORDER — LISINOPRIL 40 MG PO TABS: 40.0000 mg | ORAL_TABLET | Freq: Every day | ORAL | 3 refills | Status: DC

## 2018-01-07 NOTE — Patient Instructions (Signed)
Description   Today Jan 30th take 1 and 1/2 tablets (15mg ) then tomorrow Jan 31st take 1 and 1/2 tablets (15mg ) then continue taking the same dosage 10mg  daily except 15mg  on Tuesdays and Saturdays.   Recheck in 1 week.

## 2018-01-07 NOTE — Patient Instructions (Addendum)
Medication Instructions:  1. INCREASE LISINOPRIL TO 40 MG DAILY; NEW RX HAS BEEN SENT IN FOR THE 40 MG TABLET  2. REFILLS HAVE BEEN SENT IN FOR COREG AND HCTZ  Labwork: 1. TODAY CMET, CBC, LIPIDS  2. IN 1 WEEK YOU WILL NEED A BMET DUE TO MEDICATION INCREASE   Testing/Procedures: NONE ORDERED TODAY  Follow-Up: 1. Your physician wants you to follow-up in: 1 YEAR WITH DR. Cristal DeerHRISTOPHER END OR SCOTT WEAVER, PAC  You will receive a reminder letter in the mail two months in advance. If you don't receive a letter, please call our office to schedule the follow-up appointment.   2. YOU ARE BEING REFERRED TO PULMONOLOGY; THEIR OFFICE WILL CALL YOU WITH AN APPT    3. YOU ARE BEING REFERRED TO UROLOGY; THEIR OFFICE WILL CALL YOU WITH AN APPT   Any Other Special Instructions Will Be Listed Below (If Applicable).     If you need a refill on your cardiac medications before your next appointment, please call your pharmacy.

## 2018-01-07 NOTE — Progress Notes (Signed)
Cardiology Office Note:    Date:  01/07/2018   ID:  Tony Howard, DOB [birth year], MRN [id number]  PCP:  Lin Landsman, MD  Cardiologist:  Nelva Bush, MD   Referring MD: Lin Landsman, MD   Chief Complaint  Patient presents with  . Hypertension    medication management    History of Present Illness:    Tony Howard is a 57 y.o. 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 CT chest 2013 with hilar and mediastinal lymphadenopathy. PFTs 10/2014 consistent with severe parenchymal restriction. He was last seen by Dr. Aundra Dubin 09/20/16. At that time, cardiac involvement was suspected in the setting of non-CAD related LV aneurysm. He has a myoview 03/2012 with anterior ischemia leading to a heart cath that showed no significant coronary disease but small LV apical aneurysm. Pt could not tolerate MRI (claustrophobic). Repeat myoview 06/2013 without reversible ischemia, echo (06/2013) with normal EF and LV apical aneurysm not visualized. Repeat echo 12/2015 unable to visualize apical aneurysm; myoview 12/2015 with small partially reversible apical defect read as low risk.   He presents today for follow up. He had left hip replacement surgery November 2018 and is still walking with a cane and participating in PT. He has been doing well on medications, including coumadin, coreg, HCTZ, lisinopril, and Lipitor. Lipids are followed by [place]. Last lipid panel 2017.He is compliant on statin. Will check lipids and LFTS today. He denies chest pain, SOB, orthopnea, LE edema, and dizziness/syncope. He denies bleeding problems on coumadin. He is not compliant with INR checks.  This morning, he took his lipitor. He takes HTN medications at night and has been compliant. BP was elevated today. Will increase lisinopril to 40 mg and repeat BMP in one week.  He has not re-established with pulmonology. Will send referral today.  His main complaint today is erectile dysfunction  that started following his hip surgery. His orthopedic surgeon told him to see urology, but he has not established care with a provider. Will send referral today. Coreg has been a long-term medication for him. I have low suspicion this problem is related to his current cardiac medications. He has good peripheral pulses.   He previously followed with Dr. Aundra Dubin in clinic. Moving forward, he will see Dr. Saunders Revel.  Past Medical History:  Diagnosis Date  . Aneurysm of heart    LHC 03/13/12:  Normal cors, EF 60-65% with small but definite apical aneurysm.  He was started on coumadin as thrombus formation was a high probability  . Cardiac aneurysm   . Chronic pain   . Cutaneous sarcoidosis   . CVA (cerebral infarction)    Head CT showing encephalomalacia c/w old infarct in the distribution of the right PCA in April 2013  . DJD (degenerative joint disease)   . Family history of adverse reaction to anesthesia    pts mother and pts sister had difficulty with breathing   . History of kidney stones   . History of stroke   . Hypertension    echo 4/13:  mild LVH, EF 65-70%, grade 1 diast dysfxn, mild LAE  . Left sided numbness   . Osteoarthritis   . Sarcoidosis of lung ([place])    Myoview 3/13 with EF 47%; anterior ischemia;  LHC without CAD;  MRI recommended to r/o cardiac sarcoid - but patient claustrophobic and MRI not done  . Stroke ([place])   . Swallowing difficulty     Past  Surgical History:  Procedure Laterality Date  . CYSTOSCOPY W/ URETERAL STENT PLACEMENT    . KNEE SURGERY     right / childhood   . LEFT HEART CATHETERIZATION WITH CORONARY ANGIOGRAM N/A 03/13/2012   Procedure: LEFT HEART CATHETERIZATION WITH CORONARY ANGIOGRAM;  Surgeon: Thayer Headings, MD;  Location[facility] CATH LAB;  Service: Cardiovascular;  Laterality: N/A;  Contrast allergy  . TOTAL HIP ARTHROPLASTY Left 11/07/2016   Procedure: LEFT TOTAL HIP ARTHROPLASTY ANTERIOR APPROACH;  Surgeon: Rod Can, MD;  Location: [facility] ORS;   Service: Orthopedics;  Laterality: Left;  Needs RNFA  . WISDOM TOOTH EXTRACTION     4    Current Medications: Current Meds  Medication Sig  . atorvastatin (LIPITOR) 40 MG tablet Take 1 tablet (40 mg total) by mouth daily.  . carvedilol (COREG) 12.5 MG tablet Take 0.5 tablets (6.25 mg total) by mouth 2 (two) times daily with a meal.  . cyanocobalamin 500 MCG tablet Take 1 tablet (500 mcg total) by mouth daily.  Marland Kitchen docusate sodium (COLACE) 100 MG capsule Take 100 mg by mouth daily as needed for mild constipation.  . hydrochlorothiazide (HYDRODIURIL) 25 MG tablet Take 1 tablet (25 mg total) by mouth daily.  Marland Kitchen warfarin (COUMADIN) 10 MG tablet Take as directed by coumadin clinic  . [DISCONTINUED] lisinopril (PRINIVIL,ZESTRIL) 30 MG tablet Take 1 tablet (30 mg total) by mouth daily. Please keep 01/07/18 appointment for further refills     Allergies:   Iodine; Naprosyn [naproxen]; and Shellfish allergy   Social History   Socioeconomic History  . Marital status: Married    Spouse name: None  . Number of children: 1  . Years of education: None  . Highest education level: None  Social Needs  . Financial resource strain: None  . Food insecurity - worry: None  . Food insecurity - inability: None  . Transportation needs - medical: None  . Transportation needs - non-medical: None  Occupational History    Employer: [employer]  Tobacco Use  . Smoking status: Never Smoker  . Smokeless tobacco: Never Used  Substance and Sexual Activity  . Alcohol use: Yes    Comment: occas   . Drug use: No  . Sexual activity: Yes    Birth control/protection: None  Other Topics Concern  . None  Social History Narrative  . None     Family History: The patient's family history includes Lung cancer in his mother; Multiple sclerosis in his sister.  ROS:   Please see the history of present illness.    All other systems reviewed and are negative.  EKGs/Labs/Other Studies Reviewed:    The  following studies were reviewed today:  Echo 12/14/15: Study Conclusions - Left ventricle: The cavity size was mildly dilated. Wall   thickness was normal. Systolic function was normal. The estimated   ejection fraction was in the range of 55% to 60%. Wall motion was   normal; there were no regional wall motion abnormalities.   Abnormal relaxation with normal filling pressures. - Mitral valve: Systolic bowing without prolapse. - Left atrium: The atrium was mildly dilated.  Impressions: - LV apical aneurysm seen on LV angiogram from 2013 is not seen on   the current echo study. Consider repeat imaging with Definity   microbubble contrast.  EKG:  EKG is ordered today.  The ekg ordered today demonstrates sinus, nonspecific ST changes  Recent Labs: 02/11/2017: TSH 1.622 02/12/2017: ALT 23 02/13/2017: BUN 12; Creatinine, Ser 1.17; Hemoglobin 14.3; Platelets 191;  Potassium 3.6; Sodium 138  Recent Lipid Panel    Component Value Date/Time   CHOL 186 02/06/2016 1110   TRIG 141 02/06/2016 1110   HDL 43 02/06/2016 1110   CHOLHDL 4.3 02/06/2016 1110   VLDL 28 02/06/2016 1110   LDLCALC 115 02/06/2016 1110   LDLDIRECT 143.9 07/23/2013 0809    Physical Exam:    VS:  BP (!) 152/100   Pulse 64   Ht '6\' 3"'  (1.905 m)   Wt (!) 319 lb 12.8 oz (145.1 kg)   SpO2 97%   BMI 39.97 kg/m     Wt Readings from Last 3 Encounters:  01/07/18 (!) 319 lb 12.8 oz (145.1 kg)  02/11/17 (!) 313 lb 7.9 oz (142.2 kg)  11/07/16 (!) 312 lb (141.5 kg)     GEN: Well nourished, well developed in no acute distress, walking with a cane HEENT: Normal NECK: No JVD; No carotid bruits LYMPHATICS: No lymphadenopathy CARDIAC: RRR, no murmurs, rubs, gallops RESPIRATORY:  Clear to auscultation without rales, wheezing or rhonchi  ABDOMEN: Soft, non-tender, non-distended MUSCULOSKELETAL:  No edema; No deformity. Pedal pulses in intact bilaterally SKIN: Warm and dry NEUROLOGIC:  Alert and oriented x 3 PSYCHIATRIC:  Normal  affect   ASSESSMENT:    1. Essential hypertension   2. Aneurysm of heart   3. Encounter for long-term (current) use of antiplatelets/antithrombotics   4. Long term current use of anticoagulant therapy   5. CAD in native artery   6. Sarcoidosis   7. Erectile dysfunction, unspecified erectile dysfunction type    PLAN:    In order of problems listed above:  Essential hypertension Pt is compliant on coreg, lisinopril, and HCTZ. He takes hypertensive medications at night and has been compliant. His pressure is elevated today. Increase lisinopril to 40 mg and repeat BMP in 1 week.    Aneurysm of heart Encounter for long-term (current) use of antiplatelets/antithrombotics Long term current use of anticoagulant therapy Patient continues coumadin. He gets his INR checked with [place] [facility], but it not compliant with INR appts. Stressed the importance of INR checks.  He is not having bleeding problems. Given his previous LV apical aneurysm thought to be related to sarcoidosis and not CAD, and history of stroke, will continue coumadin.    CAD in native artery Nonobstructive disease by heart cath in 2013. Last myoview in 2017 without reversible ischemia and echo wit normal systolic function. Pt denies new chest pain. SOB, LE swelling, and syncope. He gets SOB with walking since his hip surgery and ongoing PT. He denies orthopnea.   Sarcoidosis Pt is not following with pulmonology since his pulmonologist retired. No breathing problems reported, aside from DOE related to PT following hip surgery. Sent referral to pulmonology.   Erectile dysfunction Have low suspicion this is related to his long-term cardiac medications, especially since this started after his hip surgery. Will make a referral to urology.   Medication Adjustments/Labs and Tests Ordered: Current medicines are reviewed at length with the patient today.  Concerns regarding medicines are outlined above.  Orders Placed This  Encounter  Procedures  . Basic Metabolic Panel (BMET)  . Comp Met (CMET)  . Lipid Profile  . CBC  . Ambulatory referral to Pulmonology  . Ambulatory referral to Urology  . EKG 12-Lead   Meds ordered this encounter  Medications  . lisinopril (PRINIVIL,ZESTRIL) 40 MG tablet    Sig: Take 1 tablet (40 mg total) by mouth daily.    Dispense:  90 tablet    Refill:  3  . carvedilol (COREG) 12.5 MG tablet    Sig: Take 0.5 tablets (6.25 mg total) by mouth 2 (two) times daily with a meal.    Dispense:  180 tablet    Refill:  3    Please consider 90 day supplies to promote better adherence  . hydrochlorothiazide (HYDRODIURIL) 25 MG tablet    Sig: Take 1 tablet (25 mg total) by mouth daily.    Dispense:  90 tablet    Refill:  3    Signed, Ledora Bottcher, [place]  01/07/2018 10:37 AM    [facility]

## 2018-01-08 ENCOUNTER — Telehealth: Payer: Self-pay | Admitting: *Deleted

## 2018-01-08 LAB — CBC
Hematocrit: 45 % (ref 37.5–51.0)
Hemoglobin: 15.3 g/dL (ref 13.0–17.7)
MCH: 30.6 pg (ref 26.6–33.0)
MCHC: 34 g/dL (ref 31.5–35.7)
MCV: 90 fL (ref 79–97)
PLATELETS: 195 10*3/uL (ref 150–379)
RBC: 5 x10E6/uL (ref 4.14–5.80)
RDW: 14.1 % (ref 12.3–15.4)
WBC: 5.2 10*3/uL (ref 3.4–10.8)

## 2018-01-08 LAB — COMPREHENSIVE METABOLIC PANEL
ALT: 34 IU/L (ref 0–44)
AST: 23 IU/L (ref 0–40)
Albumin/Globulin Ratio: 1.2 (ref 1.2–2.2)
Albumin: 3.6 g/dL (ref 3.5–5.5)
Alkaline Phosphatase: 87 IU/L (ref 39–117)
BUN/Creatinine Ratio: 14 (ref 9–20)
BUN: 16 mg/dL (ref 6–24)
Bilirubin Total: 0.5 mg/dL (ref 0.0–1.2)
CALCIUM: 9.4 mg/dL (ref 8.7–10.2)
CO2: 20 mmol/L (ref 20–29)
CREATININE: 1.12 mg/dL (ref 0.76–1.27)
Chloride: 106 mmol/L (ref 96–106)
GFR calc Af Amer: 84 mL/min/{1.73_m2} (ref >59)
GFR, EST NON AFRICAN AMERICAN: 73 mL/min/{1.73_m2} (ref >59)
GLOBULIN, TOTAL: 3 g/dL (ref 1.5–4.5)
Glucose: 99 mg/dL (ref 65–99)
Potassium: 4.5 mmol/L (ref 3.5–5.2)
SODIUM: 142 mmol/L (ref 134–144)
TOTAL PROTEIN: 6.6 g/dL (ref 6.0–8.5)

## 2018-01-08 LAB — LIPID PANEL
CHOL/HDL RATIO: 5 ratio (ref 0.0–5.0)
Cholesterol, Total: 209 mg/dL — ABNORMAL HIGH (ref 100–199)
HDL: 42 mg/dL (ref >39)
LDL CALC: 140 mg/dL — AB (ref 0–99)
TRIGLYCERIDES: 137 mg/dL (ref 0–149)
VLDL Cholesterol Cal: 27 mg/dL (ref 5–40)

## 2018-01-08 NOTE — Telephone Encounter (Signed)
Received request prior authorization for warfarin, called [facility] for approval, they gave me information to relay to [facility] to apply in computer so it will go through, I called them and this was successful.

## 2018-01-12 ENCOUNTER — Telehealth: Payer: Self-pay | Admitting: *Deleted

## 2018-01-12 DIAGNOSIS — I2510 Atherosclerotic heart disease of native coronary artery without angina pectoris: Secondary | ICD-10-CM

## 2018-01-12 NOTE — Telephone Encounter (Signed)
Pt has been notified of lab results by phone with verbal understanding. Pt confirms he is taking Atorvastatin 40 mg daily. Pt is agreeable to plan of care to repeat labs 6 in months. Pt agreeable to continue Atorvastatin and work on diet. Pt thanked me for my call today.

## 2018-01-12 NOTE — Telephone Encounter (Signed)
-----   Message from Loa SocksIvy M Martin, LPN sent at 1/61/09601/31/2019  5:44 PM EST -----   ----- Message ----- From: Beatrice LecherWeaver, Scott T, PA-C Sent: 01/08/2018   5:07 PM To: [facility] Triage  Please call patient. Creatinine normal.  Potassium normal.  LFTs normal.  Hemoglobin normal.  Lipid values increased since 2/17. PLAN: 1.  Ensure patient is still taking atorvastatin 40 mg daily -  If not resume 2.  Work on diet to reduce cholesterol values 3.  Repeat lipids and LFTs in 6 months.

## 2018-01-15 ENCOUNTER — Ambulatory Visit (INDEPENDENT_AMBULATORY_CARE_PROVIDER_SITE_OTHER): Payer: Medicaid Other | Admitting: *Deleted

## 2018-01-15 ENCOUNTER — Other Ambulatory Visit: Payer: Medicaid Other | Admitting: *Deleted

## 2018-01-15 DIAGNOSIS — Z7902 Long term (current) use of antithrombotics/antiplatelets: Secondary | ICD-10-CM

## 2018-01-15 DIAGNOSIS — I253 Aneurysm of heart: Secondary | ICD-10-CM | POA: Diagnosis not present

## 2018-01-15 DIAGNOSIS — I10 Essential (primary) hypertension: Secondary | ICD-10-CM

## 2018-01-15 LAB — BASIC METABOLIC PANEL
BUN/Creatinine Ratio: 14 (ref 9–20)
BUN: 16 mg/dL (ref 6–24)
CALCIUM: 9.1 mg/dL (ref 8.7–10.2)
CO2: 22 mmol/L (ref 20–29)
CREATININE: 1.11 mg/dL (ref 0.76–1.27)
Chloride: 106 mmol/L (ref 96–106)
GFR, EST AFRICAN AMERICAN: 85 mL/min/{1.73_m2} (ref >59)
GFR, EST NON AFRICAN AMERICAN: 74 mL/min/{1.73_m2} (ref >59)
Glucose: 95 mg/dL (ref 65–99)
POTASSIUM: 4.4 mmol/L (ref 3.5–5.2)
Sodium: 141 mmol/L (ref 134–144)

## 2018-01-15 LAB — POCT INR: INR: 1.7

## 2018-01-15 NOTE — Patient Instructions (Signed)
Description   Today take 15mg  then start taking 10mg  daily except 15mg  on Tuesdays, Thursdays, and Saturdays.   Recheck in 2 weeks.

## 2018-02-05 ENCOUNTER — Institutional Professional Consult (permissible substitution): Payer: Medicaid Other | Admitting: Emergency Medicine

## 2018-02-10 MED FILL — WARFARIN NA 10 MG TAB: 10 | 30 days supply | Qty: 50 | Fill #0

## 2018-03-11 ENCOUNTER — Institutional Professional Consult (permissible substitution): Payer: Medicaid Other | Admitting: Emergency Medicine

## 2018-04-21 ENCOUNTER — Institutional Professional Consult (permissible substitution): Payer: Medicaid Other | Admitting: Emergency Medicine

## 2018-05-01 ENCOUNTER — Ambulatory Visit (INDEPENDENT_AMBULATORY_CARE_PROVIDER_SITE_OTHER): Payer: Medicaid Other | Admitting: *Deleted

## 2018-05-01 DIAGNOSIS — Z7902 Long term (current) use of antithrombotics/antiplatelets: Secondary | ICD-10-CM | POA: Diagnosis not present

## 2018-05-01 DIAGNOSIS — I253 Aneurysm of heart: Secondary | ICD-10-CM

## 2018-05-01 LAB — POCT INR: INR: 3.7 — AB (ref 2.0–3.0)

## 2018-05-01 MED ORDER — WARFARIN SODIUM 10 MG PO TABS: ORAL_TABLET | ORAL | 0 refills | Status: DC

## 2018-05-01 NOTE — Patient Instructions (Signed)
Description   Hold today's dose then continue taking  daily except  on Tuesdays, Thursdays, and Saturdays.  Recheck in 3 weeks.

## 2018-06-01 ENCOUNTER — Institutional Professional Consult (permissible substitution): Payer: Medicaid Other | Admitting: Emergency Medicine

## 2018-06-03 ENCOUNTER — Other Ambulatory Visit: Payer: Self-pay | Admitting: Physician Assistant

## 2018-06-08 ENCOUNTER — Other Ambulatory Visit: Payer: Self-pay | Admitting: Physician Assistant

## 2018-06-18 ENCOUNTER — Ambulatory Visit (INDEPENDENT_AMBULATORY_CARE_PROVIDER_SITE_OTHER): Payer: Medicaid Other

## 2018-06-18 DIAGNOSIS — I253 Aneurysm of heart: Secondary | ICD-10-CM

## 2018-06-18 DIAGNOSIS — Z7902 Long term (current) use of antithrombotics/antiplatelets: Secondary | ICD-10-CM

## 2018-06-18 LAB — POCT INR: INR: 3 (ref 2.0–3.0)

## 2018-06-18 MED ORDER — WARFARIN SODIUM 10 MG PO TABS: ORAL_TABLET | ORAL | 0 refills | Status: DC

## 2018-06-18 NOTE — Patient Instructions (Signed)
Description   Continue on same dosage 10mg  daily except 15mg  on Tuesdays, Thursdays, and Saturdays.  Recheck in 4 weeks.

## 2018-07-13 ENCOUNTER — Other Ambulatory Visit: Payer: Medicaid Other

## 2018-07-16 ENCOUNTER — Other Ambulatory Visit: Payer: Medicaid Other | Admitting: *Deleted

## 2018-07-16 ENCOUNTER — Ambulatory Visit (INDEPENDENT_AMBULATORY_CARE_PROVIDER_SITE_OTHER): Payer: Medicaid Other | Admitting: *Deleted

## 2018-07-16 DIAGNOSIS — Z7902 Long term (current) use of antithrombotics/antiplatelets: Secondary | ICD-10-CM

## 2018-07-16 DIAGNOSIS — I253 Aneurysm of heart: Secondary | ICD-10-CM | POA: Diagnosis not present

## 2018-07-16 DIAGNOSIS — I2510 Atherosclerotic heart disease of native coronary artery without angina pectoris: Secondary | ICD-10-CM

## 2018-07-16 LAB — LIPID PANEL
CHOL/HDL RATIO: 6.1 ratio — AB (ref 0.0–5.0)
Cholesterol, Total: 213 mg/dL — ABNORMAL HIGH (ref 100–199)
HDL: 35 mg/dL — AB (ref >39)
LDL CALC: 144 mg/dL — AB (ref 0–99)
TRIGLYCERIDES: 171 mg/dL — AB (ref 0–149)
VLDL CHOLESTEROL CAL: 34 mg/dL (ref 5–40)

## 2018-07-16 LAB — HEPATIC FUNCTION PANEL
ALT: 30 IU/L (ref 0–44)
AST: 21 IU/L (ref 0–40)
Albumin: 3.5 g/dL (ref 3.5–5.5)
Alkaline Phosphatase: 86 IU/L (ref 39–117)
BILIRUBIN TOTAL: 0.3 mg/dL (ref 0.0–1.2)
Bilirubin, Direct: 0.1 mg/dL (ref 0.00–0.40)
Total Protein: 6.3 g/dL (ref 6.0–8.5)

## 2018-07-16 LAB — POCT INR: INR: 4.1 — AB (ref 2.0–3.0)

## 2018-07-16 MED ORDER — WARFARIN SODIUM 10 MG PO TABS: ORAL_TABLET | ORAL | 1 refills | Status: DC

## 2018-07-16 NOTE — Patient Instructions (Signed)
Description   Do not take any Coumadin tomorrow then continue on same dosage 10mg  daily except 15mg  on Tuesdays, Thursdays, and Saturdays. Get some more dark green leafy veggies.  Recheck in 3 weeks.

## 2018-07-17 ENCOUNTER — Telehealth: Payer: Self-pay | Admitting: *Deleted

## 2018-07-17 DIAGNOSIS — I2510 Atherosclerotic heart disease of native coronary artery without angina pectoris: Secondary | ICD-10-CM

## 2018-07-17 MED ORDER — ROSUVASTATIN CALCIUM 20 MG PO TABS: 20.0000 mg | ORAL_TABLET | Freq: Every day | ORAL | 3 refills | Status: DC

## 2018-07-17 NOTE — Telephone Encounter (Signed)
Tried to reach pt x 2 to go over results and recommendations. No answer.

## 2018-07-17 NOTE — Telephone Encounter (Signed)
Pt called back and has been notified of lab results and recommendations per Tereso NewcomerScott Weaver, PA. Pt is agreeable to stop Atorvastatin and start Crestor 20 mg daily; Lipids and Liver Panel to be done 10/22/18. [facility] [employer] [facility] . Pt thanked me for the call. I will forward results to PCP Dr. Leilani AbleBetti Reese.

## 2018-07-17 NOTE — Telephone Encounter (Signed)
-----   Message from Beatrice LecherScott T Weaver, [place]PA-C sent at 07/16/2018  5:06 PM EDT ----- The following abnormalities are noted: Total cholesterol, triglycerides, LDL are too high.  HDL is low. Liver enzymes are normal. All other values are normal, stable or within acceptable limits. Medication changes / Follow up labs / Other changes or recommendations:   -DC atorvastatin -Start rosuvastatin 20 mg daily -Lipids, LFTs 3 months Tereso NewcomerScott Weaver, PA-C 07/16/2018 5:05 PM

## 2018-07-17 NOTE — Telephone Encounter (Signed)
-----   Message from Scott T Weaver, PA-C sent at 07/16/2018  5:06 PM EDT ----- The following abnormalities are noted: Total cholesterol, triglycerides, LDL are too high.  HDL is low. Liver enzymes are normal. All other values are normal, stable or within acceptable limits. Medication changes / Follow up labs / Other changes or recommendations:   -DC atorvastatin -Start rosuvastatin 20 mg daily -Lipids, LFTs 3 months Scott Weaver, PA-C 07/16/2018 5:05 PM 

## 2018-07-17 NOTE — Telephone Encounter (Signed)
New message: ° ° ° ° ° °Pt is returning a call for results °

## 2018-08-03 ENCOUNTER — Institutional Professional Consult (permissible substitution): Payer: Medicaid Other | Admitting: Emergency Medicine

## 2018-09-24 ENCOUNTER — Institutional Professional Consult (permissible substitution): Payer: Medicaid Other | Admitting: Emergency Medicine

## 2018-10-22 ENCOUNTER — Other Ambulatory Visit: Payer: Medicaid Other

## 2018-11-10 ENCOUNTER — Ambulatory Visit (INDEPENDENT_AMBULATORY_CARE_PROVIDER_SITE_OTHER): Payer: Medicaid Other | Admitting: *Deleted

## 2018-11-10 DIAGNOSIS — I253 Aneurysm of heart: Secondary | ICD-10-CM

## 2018-11-10 DIAGNOSIS — Z7902 Long term (current) use of antithrombotics/antiplatelets: Secondary | ICD-10-CM | POA: Diagnosis not present

## 2018-11-10 LAB — POCT INR: INR: 3.1 — AB (ref 2.0–3.0)

## 2018-11-10 MED ORDER — WARFARIN SODIUM 10 MG PO TABS: ORAL_TABLET | ORAL | 1 refills | Status: DC

## 2018-11-10 NOTE — Patient Instructions (Signed)
Description   Today take 10mg  then continue on same dosage 10mg  daily except 15mg  on Tuesdays, Thursdays, and Saturdays. Get some more dark green leafy veggies.  Recheck in 3 weeks.

## 2018-11-26 ENCOUNTER — Ambulatory Visit (INDEPENDENT_AMBULATORY_CARE_PROVIDER_SITE_OTHER): Payer: Medicaid Other | Admitting: Emergency Medicine

## 2018-11-26 ENCOUNTER — Encounter: Payer: Self-pay | Admitting: Emergency Medicine

## 2018-11-26 DIAGNOSIS — D869 Sarcoidosis, unspecified: Secondary | ICD-10-CM | POA: Diagnosis not present

## 2018-11-26 NOTE — Patient Instructions (Signed)
We will perform a CT scan of your chest to compare with your priors. We will perform pulmonary function testing to compare with your priors. You will need to see an ophthalmologist and have an eye exam every year since you have sarcoidosis. Follow with Dr Delton CoombesByrum in the end of January after you have had your testing so that we can review the results together.

## 2018-11-26 NOTE — Progress Notes (Signed)
Subjective:    Patient ID: Tony Howard, male    DOB: 02/02/1961, 57 y.o.   MRN: [id number]  HPI 57 year old man, never smoker, with a history of sarcoidosis that was diagnosed in the early 2000's by skin biopsy. He had an ACE level of 280 in 2000.  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   Most recent CT scan of the chest reviewed by me from 11/09/2014 showed some bilateral hilar and mediastinal lymphadenopathy with some scattered pleural-based nodules, a 7 mm left upper lobe nodule  Pulmonary function testing from 10/18/2014 reviewed, this shows restriction with probable superimposed obstruction based on normal FEV1 to FVC ratio.  FEV1 is 59% predicted.  His lung volumes are restricted, decreased diffusion capacity that corrects when adjusted for alveolar volume  He tells me that his breathing was difficult due to wt gain and losing his conditioning when he had hip replacement. Since his hip was fixed his exercise and wt have improved, as has his breathing. No cough, occasional wheeze w exercise. Has not seen any rash. He does have some sinus drainage especially when cool weather.    Review of Systems  Constitutional: Positive for unexpected weight change. Negative for activity change.  HENT: Positive for ear pain. Negative for congestion, postnasal drip, rhinorrhea, sinus pain, sneezing and sore throat.   Respiratory: Negative for apnea, cough, chest tightness, shortness of breath and wheezing.   Cardiovascular: Negative for chest pain.  Gastrointestinal: Negative.   Musculoskeletal: Negative.   Neurological: Negative.     Past Medical History:  Diagnosis Date  . Aneurysm of heart    LHC 03/13/12:  Normal cors, EF 60-65% with small but definite apical aneurysm.  He was started on coumadin as thrombus formation was a high probability  . Cardiac aneurysm   . Chronic pain   . Cutaneous  sarcoidosis   . CVA (cerebral infarction)    Head CT showing encephalomalacia c/w old infarct in the distribution of the right PCA in April 2013  . DJD (degenerative joint disease)   . Family history of adverse reaction to anesthesia    pts mother and pts sister had difficulty with breathing   . History of kidney stones   . History of stroke   . Hypertension    echo 4/13:  mild LVH, EF 65-70%, grade 1 diast dysfxn, mild LAE  . Left sided numbness   . Osteoarthritis   . Sarcoidosis of lung (HCC)    Myoview 3/13 with EF 47%; anterior ischemia;  LHC without CAD;  MRI recommended to r/o cardiac sarcoid - but patient claustrophobic and MRI not done  . Stroke (HCC)   . Swallowing difficulty      Family History  Problem Relation Age of Onset  . Lung cancer Mother   . Multiple sclerosis Sister      Social History   Socioeconomic History  . Marital status: Married    Spouse name: Not on file  . Number of children: 1  . Years of education: Not on file  . Highest education level: Not on file  Occupational History    Employer: [employer]  Social Needs  . Financial resource strain: Not on file  . Food insecurity:    Worry: Not on file    Inability: Not on file  . Transportation needs:    Medical: Not on file    Non-medical: Not on file  Tobacco  Use  . Smoking status: Never Smoker  . Smokeless tobacco: Never Used  Substance and Sexual Activity  . Alcohol use: Yes    Comment: occas   . Drug use: No  . Sexual activity: Yes    Birth control/protection: None  Lifestyle  . Physical activity:    Days per week: Not on file    Minutes per session: Not on file  . Stress: Not on file  Relationships  . Social connections:    Talks on phone: Not on file    Gets together: Not on file    Attends religious service: Not on file    Active member of club or organization: Not on file    Attends meetings of clubs or organizations: Not on file    Relationship status: Not on file   . Intimate partner violence:    Fear of current or ex partner: Not on file    Emotionally abused: Not on file    Physically abused: Not on file    Forced sexual activity: Not on file  Other Topics Concern  . Not on file  Social History Narrative  . Not on file     Allergies  Allergen Reactions  . Iodine Hives and Rash  . Naprosyn [Naproxen] Hives  . Shellfish Allergy Hives     Outpatient Medications Prior to Visit  Medication Sig Dispense Refill  . carvedilol (COREG) 12.5 MG tablet Take 0.5 tablets (6.25 mg total) by mouth 2 (two) times daily with a meal. 180 tablet 3  . cyanocobalamin 500 MCG tablet Take 1 tablet (500 mcg total) by mouth daily. 30 tablet 0  . hydrochlorothiazide (HYDRODIURIL) 25 MG tablet Take 1 tablet (25 mg total) by mouth daily. 90 tablet 3  . lisinopril (PRINIVIL,ZESTRIL) 30 MG tablet Take 1 tablet (30 mg total) by mouth daily. 90 tablet 1  . warfarin (COUMADIN) 10 MG tablet Take 1 tablet daily except 1.5 tablets on Tuesday, Thursday, Saturdays or as directed by coumadin clinic. 45 tablet 1  . docusate sodium (COLACE) 100 MG capsule Take 100 mg by mouth daily as needed for mild constipation.    Marland Kitchen. lisinopril (PRINIVIL,ZESTRIL) 40 MG tablet Take 1 tablet (40 mg total) by mouth daily. 90 tablet 3  . rosuvastatin (CRESTOR) 20 MG tablet Take 1 tablet (20 mg total) by mouth daily. 90 tablet 3   No facility-administered medications prior to visit.         Objective:   Physical Exam Vitals:   11/26/18 1531  BP: 140/80  Pulse: 72  SpO2: 97%  Weight: (!) 323 lb 9.6 oz (146.8 kg)  Height: 6\' 3"  (1.905 m)   Gen: Pleasant, obese man, in no distress, somewhat flat affect  ENT: No lesions,  mouth clear,  oropharynx clear, no postnasal drip  Neck: No JVD, no stridor  Lungs: No use of accessory muscles, no crackles or wheezing on normal respiration, no wheeze on forced expiration  Cardiovascular: Distant, RRR, heart sounds normal, no murmur or gallops, trace  pretibial peripheral edema  Musculoskeletal: No deformities, no cyanosis or clubbing  Neuro: alert, awake, non focal  Skin: Warm, old hypopigmented skin and flat patches on bilateral upper extremities more so on the right than on the left, possibly some raised areas although it looks mostly like scar.      Assessment & Plan:  Sarcoidosis (HCC) History of sarcoidosis confirmed by skin biopsy.  He has bilateral hypopigmented areas on his arms that he describes to allergic reaction  in the past although question whether this is where his biopsy was done.  He cannot remember the biopsy.  He had some mediastinal lymphadenopathy on previous imaging and he needs a follow-up CT.  He has a contrast dye allergy so we will do without contrast.  He also need repeat pulmonary function testing to compare with his priors.  He is not on bronchodilator therapy currently.  I will speak to him about regular ophthalmology exam  Levy Pupa, MD, PhD 11/26/2018, 4:12 PM [facility] [phone] or if no answer [phone]

## 2018-11-26 NOTE — Assessment & Plan Note (Signed)
History of sarcoidosis confirmed by skin biopsy.  He has bilateral hypopigmented areas on his arms that he describes to allergic reaction in the past although question whether this is where his biopsy was done.  He cannot remember the biopsy.  He had some mediastinal lymphadenopathy on previous imaging and he needs a follow-up CT.  He has a contrast dye allergy so we will do without contrast.  He also need repeat pulmonary function testing to compare with his priors.  He is not on bronchodilator therapy currently.  I will speak to him about regular ophthalmology exam

## 2018-12-14 ENCOUNTER — Encounter (INDEPENDENT_AMBULATORY_CARE_PROVIDER_SITE_OTHER): Payer: Self-pay

## 2018-12-14 ENCOUNTER — Ambulatory Visit (INDEPENDENT_AMBULATORY_CARE_PROVIDER_SITE_OTHER): Payer: Medicaid Other | Admitting: *Deleted

## 2018-12-14 ENCOUNTER — Other Ambulatory Visit: Payer: Medicaid Other | Admitting: *Deleted

## 2018-12-14 DIAGNOSIS — I253 Aneurysm of heart: Secondary | ICD-10-CM | POA: Diagnosis not present

## 2018-12-14 DIAGNOSIS — Z7902 Long term (current) use of antithrombotics/antiplatelets: Secondary | ICD-10-CM | POA: Diagnosis not present

## 2018-12-14 DIAGNOSIS — I2510 Atherosclerotic heart disease of native coronary artery without angina pectoris: Secondary | ICD-10-CM

## 2018-12-14 LAB — LIPID PANEL
Chol/HDL Ratio: 5.8 ratio — ABNORMAL HIGH (ref 0.0–5.0)
Cholesterol, Total: 210 mg/dL — ABNORMAL HIGH (ref 100–199)
HDL: 36 mg/dL — AB (ref >39)
LDL Calculated: 135 mg/dL — ABNORMAL HIGH (ref 0–99)
TRIGLYCERIDES: 195 mg/dL — AB (ref 0–149)
VLDL CHOLESTEROL CAL: 39 mg/dL (ref 5–40)

## 2018-12-14 LAB — HEPATIC FUNCTION PANEL
ALT: 18 IU/L (ref 0–44)
AST: 16 IU/L (ref 0–40)
Albumin: 3.3 g/dL — ABNORMAL LOW (ref 3.5–5.5)
Alkaline Phosphatase: 73 IU/L (ref 39–117)
Bilirubin Total: 0.3 mg/dL (ref 0.0–1.2)
Bilirubin, Direct: 0.09 mg/dL (ref 0.00–0.40)
Total Protein: 6.1 g/dL (ref 6.0–8.5)

## 2018-12-14 LAB — POCT INR: INR: 3.5 — AB (ref 2.0–3.0)

## 2018-12-14 MED ORDER — WARFARIN SODIUM 10 MG PO TABS: ORAL_TABLET | ORAL | 0 refills | Status: DC

## 2018-12-14 NOTE — Patient Instructions (Signed)
Description   Skip today's dose, then start taking  10mg  daily except 15mg  on Tuesdays and Saturdays.  Recheck in 3 weeks. Call us with any medication changes or concerns # [phone].

## 2018-12-15 ENCOUNTER — Telehealth: Payer: Self-pay

## 2018-12-15 DIAGNOSIS — I2510 Atherosclerotic heart disease of native coronary artery without angina pectoris: Secondary | ICD-10-CM

## 2018-12-15 DIAGNOSIS — I10 Essential (primary) hypertension: Secondary | ICD-10-CM

## 2018-12-15 MED ORDER — ROSUVASTATIN CALCIUM 20 MG PO TABS: 20.0000 mg | ORAL_TABLET | Freq: Every day | ORAL | 3 refills | Status: DC

## 2018-12-15 NOTE — Telephone Encounter (Signed)
Made pt aware of results and recommendations who verbalized understanding.  Per Tereso Newcomer, PA-C to: stop atorvastatin, start rosuvastatin 20 mg daily and repeat fasting lipids and lfts in 3 months. Lab appt scheduled for 03/18/19. Rx sent to [facility] [place]. Pt thanked me for the call.

## 2018-12-30 ENCOUNTER — Ambulatory Visit (HOSPITAL_COMMUNITY)
Admission: RE | Admit: 2018-12-30 | Discharge: 2018-12-30 | Disposition: A | Discharge status: Home / Self Care | Payer: Medicaid Other | Source: Ambulatory Visit | Attending: Emergency Medicine | Admitting: Emergency Medicine

## 2018-12-30 DIAGNOSIS — D869 Sarcoidosis, unspecified: Secondary | ICD-10-CM | POA: Insufficient documentation

## 2019-01-01 ENCOUNTER — Ambulatory Visit: Payer: Medicaid Other | Admitting: Emergency Medicine

## 2019-01-13 ENCOUNTER — Ambulatory Visit (INDEPENDENT_AMBULATORY_CARE_PROVIDER_SITE_OTHER): Payer: Medicaid Other | Admitting: Emergency Medicine

## 2019-01-13 ENCOUNTER — Encounter: Payer: Self-pay | Admitting: Emergency Medicine

## 2019-01-13 DIAGNOSIS — D869 Sarcoidosis, unspecified: Secondary | ICD-10-CM

## 2019-01-13 NOTE — Assessment & Plan Note (Signed)
CT chest stable compared with 2015.  He had not had his pulmonary function testing done yet.  He is trying to work on diet and exercise.  No new rash, no new breathing difficulty are described.  We will continue his surveillance.  He needs pulmonary function testing at his next visit in 6 months.  Your CT chest is stable compared with 2015. This is good news.  Please follow up in 6 months with pulmonary function testing testing on the same day.  Remember you need an annual eye exam. Follow with Dr Delton Coombes in 6 months or sooner if you have any problems

## 2019-01-13 NOTE — Progress Notes (Signed)
Subjective:    Patient ID: Tony Howard, male    DOB: [birth year], 58 y.o.   MRN: [id number]  HPI 58 year old man, never smoker, with a history of sarcoidosis that was diagnosed in the early 2000's by skin biopsy. He had an ACE level of 280 in 2000.  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   Most recent CT scan of the chest reviewed by me from 11/09/2014 showed some bilateral hilar and mediastinal lymphadenopathy with some scattered pleural-based nodules, a 7 mm left upper lobe nodule  Pulmonary function testing from 10/18/2014 reviewed, this shows restriction with probable superimposed obstruction based on normal FEV1 to FVC ratio.  FEV1 is 59% predicted.  His lung volumes are restricted, decreased diffusion capacity that corrects when adjusted for alveolar volume  He tells me that his breathing was difficult due to wt gain and losing his conditioning when he had hip replacement. Since his hip was fixed his exercise and wt have improved, as has his breathing. No cough, occasional wheeze w exercise. Has not seen any rash. He does have some sinus drainage especially when cool weather.   ROV 01/13/19 --Tony Howard is 58, never smoker with a history of sarcoidosis, on anticoagulation for an apical cardiac aneurysm.  He also has hypertension, osteoarthritis with a history of a CVA.  I saw him in late December for his sarcoidosis.  A CT scan of his chest was done on 12/30/2018 that I reviewed.  This shows bilateral mediastinal and hilar lymphadenopathy up to 2.3 cm, no axillary adenopathy, some subpleural nodular lesions bilaterally with some associated bronchiectasis and architectural distortion in the lower lobes.  Overall appears to be stable compared with his CT that was done in 2015.  We ordered pulmonary function testing but he has not had these done yet.  No rash. No cough, some improving DOE.    Review  of Systems  Constitutional: Positive for unexpected weight change. Negative for activity change.  HENT: Positive for ear pain. Negative for congestion, postnasal drip, rhinorrhea, sinus pain, sneezing and sore throat.   Respiratory: Negative for apnea, cough, chest tightness, shortness of breath and wheezing.   Cardiovascular: Negative for chest pain.  Gastrointestinal: Negative.   Musculoskeletal: Negative.   Neurological: Negative.     Past Medical History:  Diagnosis Date  . Aneurysm of heart    LHC 03/13/12:  Normal cors, EF 60-65% with small but definite apical aneurysm.  He was started on coumadin as thrombus formation was a high probability  . Cardiac aneurysm   . Chronic pain   . Cutaneous sarcoidosis   . CVA (cerebral infarction)    Head CT showing encephalomalacia c/w old infarct in the distribution of the right PCA in April 2013  . DJD (degenerative joint disease)   . Family history of adverse reaction to anesthesia    pts mother and pts sister had difficulty with breathing   . History of kidney stones   . History of stroke   . Hypertension    echo 4/13:  mild LVH, EF 65-70%, grade 1 diast dysfxn, mild LAE  . Left sided numbness   . Osteoarthritis   . Sarcoidosis of lung (HCC)    Myoview 3/13 with EF 47%; anterior ischemia;  LHC without CAD;  MRI recommended to r/o cardiac sarcoid - but patient claustrophobic and MRI not done  . Stroke (HCC)   . Swallowing difficulty  Family History  Problem Relation Age of Onset  . Lung cancer Mother   . Multiple sclerosis Sister      Social History   Socioeconomic History  . Marital status: Married    Spouse name: Not on file  . Number of children: 1  . Years of education: Not on file  . Highest education level: Not on file  Occupational History    Employer: [employer]  Social Needs  . Financial resource strain: Not on file  . Food insecurity:    Worry: Not on file    Inability: Not on file  .  Transportation needs:    Medical: Not on file    Non-medical: Not on file  Tobacco Use  . Smoking status: Never Smoker  . Smokeless tobacco: Never Used  Substance and Sexual Activity  . Alcohol use: Yes    Comment: occas   . Drug use: No  . Sexual activity: Yes    Birth control/protection: None  Lifestyle  . Physical activity:    Days per week: Not on file    Minutes per session: Not on file  . Stress: Not on file  Relationships  . Social connections:    Talks on phone: Not on file    Gets together: Not on file    Attends religious service: Not on file    Active member of club or organization: Not on file    Attends meetings of clubs or organizations: Not on file    Relationship status: Not on file  . Intimate partner violence:    Fear of current or ex partner: Not on file    Emotionally abused: Not on file    Physically abused: Not on file    Forced sexual activity: Not on file  Other Topics Concern  . Not on file  Social History Narrative  . Not on file     Allergies  Allergen Reactions  . Iodine Hives and Rash  . Naprosyn [Naproxen] Hives  . Shellfish Allergy Hives     Outpatient Medications Prior to Visit  Medication Sig Dispense Refill  . carvedilol (COREG) 12.5 MG tablet Take 0.5 tablets (6.25 mg total) by mouth 2 (two) times daily with a meal. 180 tablet 3  . cyanocobalamin 500 MCG tablet Take 1 tablet (500 mcg total) by mouth daily. 30 tablet 0  . hydrochlorothiazide (HYDRODIURIL) 25 MG tablet Take 1 tablet (25 mg total) by mouth daily. 90 tablet 3  . lisinopril (PRINIVIL,ZESTRIL) 30 MG tablet Take 1 tablet (30 mg total) by mouth daily. 90 tablet 1  . rosuvastatin (CRESTOR) 20 MG tablet Take 1 tablet (20 mg total) by mouth daily. 90 tablet 3  . warfarin (COUMADIN) 10 MG tablet Take 1 tablet daily except 1.5 tablets on Tuesday, Thursday, Saturdays or as directed by coumadin clinic. 45 tablet 0   No facility-administered medications prior to visit.          Objective:   Physical Exam Vitals:   01/13/19 1606  BP: 126/82  Pulse: 66  SpO2: 98%  Weight: (!) 320 lb (145.2 kg)  Height: 6\' 4"  (1.93 m)   Gen: Pleasant, obese man, in no distress, somewhat flat affect  ENT: No lesions,  mouth clear,  oropharynx clear, no postnasal drip  Neck: No JVD, no stridor  Lungs: No use of accessory muscles, no crackles or wheezing on normal respiration, no wheeze on forced expiration  Cardiovascular: Distant, RRR, heart sounds normal, no murmur or gallops, trace pretibial  peripheral edema  Musculoskeletal: No deformities, no cyanosis or clubbing  Neuro: alert, awake, non focal  Skin: Warm, old hypopigmented skin and flat patches on bilateral upper extremities more so on the right than on the left, possibly some raised areas although it looks mostly like scar.      Assessment & Plan:  Sarcoidosis (HCC) CT chest stable compared with 2015.  He had not had his pulmonary function testing done yet.  He is trying to work on diet and exercise.  No new rash, no new breathing difficulty are described.  We will continue his surveillance.  He needs pulmonary function testing at his next visit in 6 months.  Your CT chest is stable compared with 2015. This is good news.  Please follow up in 6 months with pulmonary function testing testing on the same day.  Remember you need an annual eye exam. Follow with Dr Delton CoombesByrum in 6 months or sooner if you have any problems  Tony Pupaobert Doyce Saling, MD, PhD 01/13/2019, 4:26 PM [facility] [phone] or if no answer [phone]

## 2019-01-13 NOTE — Patient Instructions (Signed)
Your CT chest is stable compared with 2015. This is good news.  Please follow up in 6 months with pulmonary function testing testing on the same day.  Remember you need an annual eye exam. Follow with Dr Delton Coombes in 6 months or sooner if you have any problems

## 2019-01-27 ENCOUNTER — Other Ambulatory Visit: Payer: Self-pay | Admitting: Physician Assistant

## 2019-01-28 ENCOUNTER — Other Ambulatory Visit: Payer: Self-pay | Admitting: Physician Assistant

## 2019-02-09 ENCOUNTER — Ambulatory Visit: Payer: Medicaid Other | Admitting: Physician Assistant

## 2019-02-09 ENCOUNTER — Encounter: Payer: Self-pay | Admitting: Physician Assistant

## 2019-02-09 DIAGNOSIS — I429 Cardiomyopathy, unspecified: Secondary | ICD-10-CM

## 2019-02-09 DIAGNOSIS — E785 Hyperlipidemia, unspecified: Secondary | ICD-10-CM | POA: Insufficient documentation

## 2019-02-09 HISTORY — DX: Cardiomyopathy, unspecified: I42.9

## 2019-02-09 NOTE — Progress Notes (Deleted)
Cardiology Office Note:    Date:  02/09/2019   ID:  Tony Howard, DOB 1961/02/01, MRN [id number]  PCP:  Tony Able, MD  Cardiologist:  Tony Kendall, MD *** Electrophysiologist:  None   Referring MD: Tony Able, MD   No chief complaint on file. ***  History of Present Illness:    Tony Howard is a 58 y.o. 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  He was previously followed by Tony Howard.  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  Prior LHC showed no significant CAD.  The patient could not tolerate an MRI in the past due to claustrophobia.  He was last seen in clinic by Tony Flesher, PA-C in 12/2017.  ***  Tony Howard ***  Prior CV studies:   The following studies were reviewed today:  *** Chest CT WO Contrast 12/30/2018 IMPRESSION: 1. Mediastinal, hilar and pleuroparenchymal findings of sarcoid. Minimal increase in bibasilar bronchiectasis, architectural distortion and volume loss. 2.  Aortic atherosclerosis (ICD10-170.0). 3. Enlarged pulmonary arteries, indicative of pulmonary arterial hypertension. 4.  Emphysema (ICD10-J43.9).  Myoview 12/14/15 Low risk stress nuclear study with small, severe intensity, partially reversible apical defect and small, moderate intensity, fixed inferior basal defect; findings consistent with mild apical ischemia and inferior basal thinning; EF 53 with normal wall motion.  Echo 12/14/15 EF 55-60, no RWMA, mild LAE; LV aneurysm not visualized on this study.  Echo 10/04/14 Mild LVE, mild LVH, EF 55-60, no RWMA, Gr 1 DD  Myoview 07/16/13 EF 47, no ischemia, Low Risk  Cardiac Catheterization 03/13/12 Left Main: The Left main is smooth and normal. Left anterior Descending: The LAD is smooth and normal throughout it's course.  There are several small diagonals that are smooth and normal.  The distal LAD wraps  around the apex Left Circumflex: The left circumflex is a moderate - large artery.  It is smooth and normal throughout it's course.  There are several small marginal branches that are normal. Ramus Intermediate Artery:   The ramus intermediate artery is large.  It is smooth and normal . Right Coronary Artery:  The RCA is moderate in size.  It is smooth and normal throughout it's course.   LV Gram: The LV gram was performed in a 30 RAO position.  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  The contractility in the remaining areas is well preserved and is actually quite vigorous.  The EF is 60-65%. Conclusions:   1. Smooth and normal coronaries.  2. 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   Will review this with my partners.  He may need further studies ( MRI, repeat echo with contrast)  Past Medical History:  Diagnosis Date  . Cardiomyopathy (HCC) 02/09/2019   +LV Apical Anuerysm >> Coumadin Rx (hx of CVA) // Admx with CP in 4/13 - Myoview with ant ischemia and EF 47; LHC with normal Cors but small LV apical aneurysm (?related to cardiac sarcoid) // Pt unable to do MRI due to claustrophobia // Echo 12/2015-EF 55-60, no RWMA, mild LAE; LV aneurysm not visualized on this study // Myoview 1/17: Low Risk, mild apical ischemia, inf basal thinning, EF 53  . Chronic pain   . Cutaneous sarcoidosis   . DJD (degenerative joint disease)   . Family history of adverse reaction to anesthesia    pts mother and pts sister had difficulty with  breathing   . History of kidney stones   . History of stroke    Head CT showing encephalomalacia c/w old infarct in the distribution of the right PCA in April 2013  . Hypertension    echo 4/13:  mild LVH, EF 65-70%, grade 1 diast dysfxn, mild LAE  . Left sided numbness   . LV Apical Aneurysm    Dx on LHC in 2013 >> Coumadin Rx  . Osteoarthritis   . Sarcoidosis of lung (HCC)    Myoview 3/13 with EF 47%; anterior ischemia;  LHC without CAD;   MRI recommended to r/o cardiac sarcoid - but patient claustrophobic and MRI not done  . Swallowing difficulty   PMH:  1. Sarcoidosis: Pulmonary and cutaneous.  CT chest in 4/13 showed hilar and mediastinal lymphadenopathy but no significant parenchymal involvement.  PFTs (4/13) showed mild restriction.  Patient follows with Dr. Shelle Howard. Suspect cardiac involvement with non-CAD-related LV aneurysm.  PFTs (11/15): FVC 58%, FEV1 59%, ration 101%, TLC 57%, DLCO 53% (moderate-severe obstruction, severe parenchymal restriction).  CT chest (12/15) with bilateral hilar and mediastinal lymphadenopathy, pleural-based nodules.  2. HTN 3. OA left hip with severe pain.  4. H/o CVA 5. Contrast allergy 6. Cardiomyopathy: Admitted in 4/13 with CP.  Myoview showed EF 47% with anterior ischemia. Echo showed EF 65%, apex poorly visualized.  LHC (4/13) showed no significant coronary disease but there was a small LV apical aneurysm. LV aneurysms have been seen with cardiac sarcoidosis, and I suspect this is the cause.  We planned an MRI to evaluate but patient was too claustrophobic and could not do the study.  Lexiscan Cardiolite (7/14) with EF 47%, no ischemia or infarction.  Echo (7/14) with EF 55-60%, mild MR, PA systolic pressure 33 mmHg, normal RV size and systolic function, LV apical aneurysm was not visualized on this echo.  Echo (10/15) with EF 55-60%, mild LVH, LV apical aneurysm was not visualized.  - Echo (1/17) with EF 55%, unable to visualize apical aneurysm. - Cardiolite (1/17) with small, partially reversible apical defect, EF 53%, low risk.     Surgical Hx: The patient  has a past surgical history that includes Knee surgery; Cystoscopy w/ ureteral stent placement; left heart catheterization with coronary angiogram (N/A, 03/13/2012); Wisdom tooth extraction; and Total hip arthroplasty (Left, 11/07/2016).   Current Medications: No outpatient medications have been marked as taking for the 02/09/19 encounter  (Appointment) with Tony Newcomer T, PA-C.     Allergies:   Iodine; Naprosyn [naproxen]; and Shellfish allergy   Social History   Tobacco Use  . Smoking status: Never Smoker  . Smokeless tobacco: Never Used  Substance Use Topics  . Alcohol use: Yes    Comment: occas   . Drug use: No     Family Hx: The patient's family history includes Lung cancer in his mother; Multiple sclerosis in his sister.  ROS:   Please see the history of present illness.    ROS All other systems reviewed and are negative.   EKGs/Labs/Other Test Reviewed:    EKG:  EKG is *** ordered today.  The ekg ordered today demonstrates ***  Recent Labs: 12/14/2018: ALT 18   Recent Lipid Panel Lab Results  Component Value Date/Time   CHOL 210 (H) 12/14/2018 10:58 AM   TRIG 195 (H) 12/14/2018 10:58 AM   HDL 36 (L) 12/14/2018 10:58 AM   CHOLHDL 5.8 (H) 12/14/2018 10:58 AM   CHOLHDL 4.3 02/06/2016 11:10 AM   LDLCALC 135 (H)  12/14/2018 10:58 AM   LDLDIRECT 143.9 07/23/2013 08:09 AM    Physical Exam:    VS:  There were no vitals taken for this visit.    Wt Readings from Last 3 Encounters:  01/13/19 (!) 320 lb (145.2 kg)  11/26/18 (!) 323 lb 9.6 oz (146.8 kg)  01/07/18 (!) 319 lb 12.8 oz (145.1 kg)     ***Physical Exam  ASSESSMENT & PLAN:    LV Apical Aneurysm  Cardiomyopathy, unspecified type (HCC)  Essential hypertension  Sarcoidosis (HCC)  Hyperlipidemia, unspecified hyperlipidemia type*** 1. Cardiomyopathy: Patient has been noted in the past to have a small LV apical aneurysm.  He was unable to do a cardiac MRI due to claustrophobia.  He had no coronary disease on angiography in the past.  I suspect that the aneurysm was caused by cardiac sarcoidosis.  LV aneurysm formation has been described with cardiac sarcoidosis.  He had a CVA in the past so is on chronic coumadin.  Most recent echo in 1/17 did not visualize the LV apical aneurysm.  2. Chest pain: Atypical chest pain in the past.  He had  coronary angiography in 2013 without significant CAD.  Last Cardiolite in 1/17 was low risk.  3. Hyperlipidemia: He is now on atorvastatin. Good lipids in 2/17.  4. Sarcoidosis: There was evidence for sarcoidosis on 12/15 CT.  He used to see Dr Tony Ironlance who has now retired.  He needs pulmonary followup, I will arrange.  5. Pre-operative workup: He should be of acceptable risk to undergo left THR.  Given history of possible cardioembolic stroke, he should have Lovenox bridge off warfarin.  Dispo:  No follow-ups on file.   Medication Adjustments/Labs and Tests Ordered: Current medicines are reviewed at length with the patient today.  Concerns regarding medicines are outlined above.  Tests Ordered: No orders of the defined types were placed in this encounter.  Medication Changes: No orders of the defined types were placed in this encounter.   Signed, Tony NewcomerScott Cerenity Goshorn, PA-C  02/09/2019 8:49 AM    [facility] [address], [place], [place]  [address] Phone: [phone]; Fax: [phone]

## 2019-02-12 ENCOUNTER — Encounter: Payer: Self-pay | Admitting: Physician Assistant

## 2019-02-12 ENCOUNTER — Ambulatory Visit: Payer: Medicaid Other | Admitting: Physician Assistant

## 2019-02-12 ENCOUNTER — Encounter (INDEPENDENT_AMBULATORY_CARE_PROVIDER_SITE_OTHER): Payer: Self-pay

## 2019-02-12 VITALS — BP 140/94 | HR 64 | Ht 76.0 in | Wt 321.1 lb

## 2019-02-12 DIAGNOSIS — I493 Ventricular premature depolarization: Secondary | ICD-10-CM

## 2019-02-12 DIAGNOSIS — I429 Cardiomyopathy, unspecified: Secondary | ICD-10-CM

## 2019-02-12 DIAGNOSIS — I253 Aneurysm of heart: Secondary | ICD-10-CM

## 2019-02-12 DIAGNOSIS — D869 Sarcoidosis, unspecified: Secondary | ICD-10-CM | POA: Diagnosis not present

## 2019-02-12 LAB — BASIC METABOLIC PANEL
BUN/Creatinine Ratio: 15 (ref 9–20)
BUN: 18 mg/dL (ref 6–24)
CO2: 20 mmol/L (ref 20–29)
Calcium: 9.6 mg/dL (ref 8.7–10.2)
Chloride: 107 mmol/L — ABNORMAL HIGH (ref 96–106)
Creatinine, Ser: 1.21 mg/dL (ref 0.76–1.27)
GFR calc non Af Amer: 66 mL/min/{1.73_m2} (ref >59)
GFR, EST AFRICAN AMERICAN: 76 mL/min/{1.73_m2} (ref >59)
Glucose: 98 mg/dL (ref 65–99)
Potassium: 4.4 mmol/L (ref 3.5–5.2)
Sodium: 141 mmol/L (ref 134–144)

## 2019-02-12 LAB — MAGNESIUM: Magnesium: 1.9 mg/dL (ref 1.6–2.3)

## 2019-02-12 MED ORDER — CARVEDILOL 12.5 MG PO TABS: ORAL_TABLET | ORAL | 3 refills | Status: DC

## 2019-02-12 MED ORDER — ROSUVASTATIN CALCIUM 20 MG PO TABS: 20.0000 mg | ORAL_TABLET | Freq: Every day | ORAL | 3 refills | Status: DC

## 2019-02-12 NOTE — Progress Notes (Signed)
Cardiology Office Note:    Date:  02/12/2019   ID:  Tony Howard, DOB [birth year], MRN [id number]  PCP:  Tony Able, MD  Cardiologist:  Previously Dr Tony Howard Electrophysiologist:  None  Pulmonologist:  Levy Pupa, MD  Referring MD: Tony Able, MD   Chief Complaint  Patient presents with  . Follow-up    Cardiomyopathy     History of Present Illness:    Tony Howard is a 58 y.o. 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  He was previously followed by Dr. Shirlee Howard.  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  Prior LHC showed no significant CAD.  The patient could not tolerate an MRI in the past due to claustrophobia.  He was last seen in clinic by Tony Flesher, PA-C in 12/2017.     Tony Howard returns for follow up.  He is here alone.  He has not had any chest pain.  His breathing is stable.  He has not had any syncope or near syncope.  He has not had any paroxysmal nocturnal dyspnea, leg swelling.  He is our of his Carvedilol.  He does not check his BP at home.    Prior CV studies:   The following studies were reviewed today:  Chest CT WO Contrast 12/30/2018 IMPRESSION: 1. Mediastinal, hilar and pleuroparenchymal findings of sarcoid. Minimal increase in bibasilar bronchiectasis, architectural distortion and volume loss. 2.  Aortic atherosclerosis (ICD10-170.0). 3. Enlarged pulmonary arteries, indicative of pulmonary arterial hypertension. 4.  Emphysema (ICD10-J43.9).  Myoview 12/14/15 Low risk stress nuclear study with small, severe intensity, partially reversible apical defect and small, moderate intensity, fixed inferior basal defect; findings consistent with mild apical ischemia and inferior basal thinning; EF 53 with normal wall motion.  Echo 12/14/15 EF 55-60, no RWMA, mild LAE; LV aneurysm not visualized on this study.  Echo  10/04/14 Mild LVE, mild LVH, EF 55-60, no RWMA, Gr 1 DD  Myoview 07/16/13 EF 47, no ischemia, Low Risk  Cardiac Catheterization 03/13/12 Left Main: The Left main is smooth and normal. Left anterior Descending: The LAD is smooth and normal throughout it's course.  There are several small diagonals that are smooth and normal.  The distal LAD wraps around the apex Left Circumflex: The left circumflex is a moderate - large artery.  It is smooth and normal throughout it's course.  There are several small marginal branches that are normal. Ramus Intermediate Artery:   The ramus intermediate artery is large.  It is smooth and normal . Right Coronary Artery:  The RCA is moderate in size.  It is smooth and normal throughout it's course.   LV Gram: The LV gram was performed in a 30 RAO position.  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  The contractility in the remaining areas is well preserved and is actually quite vigorous.  The EF is 60-65%. Conclusions:   1. Smooth and normal coronaries.  2. 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   Will review this with my partners.  He may need further studies ( MRI, repeat echo with contrast)  Past Medical History:  Diagnosis Date  . Cardiomyopathy (HCC) 02/09/2019   +LV Apical Anuerysm >> Coumadin Rx (hx of CVA) // Admx with CP in 4/13 - Myoview with ant ischemia and EF 47; LHC with normal Cors but small LV apical aneurysm (?related to cardiac sarcoid) //  Pt unable to do MRI due to claustrophobia // Echo 12/2015-EF 55-60, no RWMA, mild LAE; LV aneurysm not visualized on this study // Myoview 1/17: Low Risk, mild apical ischemia, inf basal thinning, EF 53  . Chronic pain   . Cutaneous sarcoidosis   . DJD (degenerative joint disease)   . Family history of adverse reaction to anesthesia    pts mother and pts sister had difficulty with breathing   . History of kidney stones   . History of stroke    Head CT showing encephalomalacia c/w  old infarct in the distribution of the right PCA in April 2013  . Hypertension    echo 4/13:  mild LVH, EF 65-70%, grade 1 diast dysfxn, mild LAE  . Left sided numbness   . LV Apical Aneurysm    Dx on LHC in 2013 >> Coumadin Rx  . Osteoarthritis   . Sarcoidosis of lung (HCC)   . Swallowing difficulty   PMH:  1. Sarcoidosis: Pulmonary and cutaneous.  CT chest in 4/13 showed hilar and mediastinal lymphadenopathy but no significant parenchymal involvement.  PFTs (4/13) showed mild restriction.  Patient follows with Dr. Shelle Iron. Suspect cardiac involvement with non-CAD-related LV aneurysm.  PFTs (11/15): FVC 58%, FEV1 59%, ration 101%, TLC 57%, DLCO 53% (moderate-severe obstruction, severe parenchymal restriction).  CT chest (12/15) with bilateral hilar and mediastinal lymphadenopathy, pleural-based nodules.  2. HTN 3. OA left hip with severe pain.  4. H/o CVA 5. Contrast allergy 6. Cardiomyopathy: Admitted in 4/13 with CP.  Myoview showed EF 47% with anterior ischemia. Echo showed EF 65%, apex poorly visualized.  LHC (4/13) showed no significant coronary disease but there was a small LV apical aneurysm. LV aneurysms have been seen with cardiac sarcoidosis, and I suspect this is the cause.  We planned an MRI to evaluate but patient was too claustrophobic and could not do the study.  Lexiscan Cardiolite (7/14) with EF 47%, no ischemia or infarction.  Echo (7/14) with EF 55-60%, mild MR, PA systolic pressure 33 mmHg, normal RV size and systolic function, LV apical aneurysm was not visualized on this echo.  Echo (10/15) with EF 55-60%, mild LVH, LV apical aneurysm was not visualized.  - Echo (1/17) with EF 55%, unable to visualize apical aneurysm. - Cardiolite (1/17) with small, partially reversible apical defect, EF 53%, low risk.     Surgical Hx: The patient  has a past surgical history that includes Knee surgery; Cystoscopy w/ ureteral stent placement; left heart catheterization with coronary  angiogram (N/A, 03/13/2012); Wisdom tooth extraction; and Total hip arthroplasty (Left, 11/07/2016).   Current Medications: Current Meds  Medication Sig  . carvedilol (COREG) 12.5 MG tablet TAKE 1/2 (ONE-HALF) TABLET BY MOUTH TWICE DAILY WITH A MEAL  . cyanocobalamin 500 MCG tablet Take 1 tablet (500 mcg total) by mouth daily.  . hydrochlorothiazide (HYDRODIURIL) 25 MG tablet Take 1 tablet (25 mg total) by mouth daily.  Marland Kitchen lisinopril (PRINIVIL,ZESTRIL) 30 MG tablet Take 1 tablet (30 mg total) by mouth daily.  . rosuvastatin (CRESTOR) 20 MG tablet Take 1 tablet (20 mg total) by mouth daily.  Marland Kitchen warfarin (COUMADIN) 10 MG tablet Take 1 tablet daily except 1.5 tablets on Tuesday, Thursday, Saturdays or as directed by coumadin clinic.  . [DISCONTINUED] carvedilol (COREG) 12.5 MG tablet TAKE 1/2 (ONE-HALF) TABLET BY MOUTH TWICE DAILY WITH A MEAL  . [DISCONTINUED] rosuvastatin (CRESTOR) 20 MG tablet Take 1 tablet (20 mg total) by mouth daily.     Allergies:  Iodine; Naprosyn [naproxen]; and Shellfish allergy   Social History   Tobacco Use  . Smoking status: Never Smoker  . Smokeless tobacco: Never Used  Substance Use Topics  . Alcohol use: Yes    Comment: occas   . Drug use: No     Family Hx: The patient's family history includes Lung cancer in his mother; Multiple sclerosis in his sister.  ROS:   Please see the history of present illness.    ROS All other systems reviewed and are negative.   EKGs/Labs/Other Test Reviewed:    EKG:  EKG is  ordered today.  The ekg ordered today demonstrates normal sinus rhythm, HR 64, LAD, TW inversions in 2, 3, aVF, V4-6, QTc 420, PVCs.  No significant changes when compared to old EKGs.    Recent Labs: 12/14/2018: ALT 18   Recent Lipid Panel Lab Results  Component Value Date/Time   CHOL 210 (H) 12/14/2018 10:58 AM   TRIG 195 (H) 12/14/2018 10:58 AM   HDL 36 (L) 12/14/2018 10:58 AM   CHOLHDL 5.8 (H) 12/14/2018 10:58 AM   CHOLHDL 4.3 02/06/2016  11:10 AM   LDLCALC 135 (H) 12/14/2018 10:58 AM   LDLDIRECT 143.9 07/23/2013 08:09 AM    Physical Exam:    VS:  BP (!) 140/94   Pulse 64   Ht 6\' 4"  (1.93 m)   Wt (!) 321 lb 1.9 oz (145.7 kg)   SpO2 96%   BMI 39.09 kg/m     Wt Readings from Last 3 Encounters:  02/12/19 (!) 321 lb 1.9 oz (145.7 kg)  01/13/19 (!) 320 lb (145.2 kg)  11/26/18 (!) 323 lb 9.6 oz (146.8 kg)     Physical Exam  Constitutional: He is oriented to person, place, and time. He appears well-developed and well-nourished. No distress.  HENT:  Head: Normocephalic and atraumatic.  Eyes: No scleral icterus.  Neck: No JVD present. No thyromegaly present.  Cardiovascular: Normal rate and regular rhythm.  No murmur heard. Pulmonary/Chest: Effort normal. He has no rales.  Abdominal: Soft. He exhibits no distension.  Musculoskeletal:        General: No edema.  Lymphadenopathy:    He has no cervical adenopathy.  Neurological: He is alert and oriented to person, place, and time.  Skin: Skin is warm and dry.  Psychiatric: He has a normal mood and affect.    ASSESSMENT & PLAN:    Cardiomyopathy / LV Apical Aneurysm LV apical aneurysm is suspected to be secondary to cardiac sarcoidosis.  He has a hx of CVA and is therefore on long term anticoagulation with Coumadin.  He is overall stable.  He has not had an echocardiogram since 2017.  I will arrange a follow up 2-D echocardiogram.  I will also review with Dr. Shirlee Howard.  It may be best for him to maintain follow up with the Advanced Heart Failure clinic given his know cardiomyopathy related to sarcoid with assoc LV apical aneurysm.    PVC's (premature ventricular contractions) Obtain BMET, Mg2+.  Arrange 24 Hr Holter to rule out NSVT and assess PVC burden.   Sarcoidosis (HCC)  Continue follow up with pulmonology.  Hypertension  BP elevated.  He has not had Carvedilol in 2-3 days.  Resume Carvedilol.  Continue HCTZ, Lisinopril.  Obtain BMET, Mg2+.  Hyperlipidemia   Continue Crestor.  FU Lipids and LFTs due in 1 month.   Dispo:  I will review with Dr. Shirlee Howard.  I suspect he would benefit from follow up at the  CHF Clinic with Dr. Shirlee Howard.     Medication Adjustments/Labs and Tests Ordered: Current medicines are reviewed at length with the patient today.  Concerns regarding medicines are outlined above.  Tests Ordered: Orders Placed This Encounter  Procedures  . Basic metabolic panel  . Magnesium  . HOLTER MONITOR - 24 HOUR  . EKG 12-Lead  . ECHOCARDIOGRAM COMPLETE   Medication Changes: Meds ordered this encounter  Medications  . rosuvastatin (CRESTOR) 20 MG tablet    Sig: Take 1 tablet (20 mg total) by mouth daily.    Dispense:  90 tablet    Refill:  3  . carvedilol (COREG) 12.5 MG tablet    Sig: TAKE 1/2 (ONE-HALF) TABLET BY MOUTH TWICE DAILY WITH A MEAL    Dispense:  45 tablet    Refill:  3    Signed, Tereso Newcomer, PA-C  02/12/2019 11:47 AM    [facility] [address] [place], [place], [place]  [address] Phone: [phone]; Fax: [phone]

## 2019-02-12 NOTE — Patient Instructions (Signed)
Medication Instructions:  Refills have been sent in to your pharmacy for your Cardiac medications.  If you need a refill on your cardiac medications before your next appointment, please call your pharmacy.   Lab work: TODAY: BMET, MAGNESIUM   If you have labs (blood work) drawn today and your tests are completely normal, you will receive your results only by: Marland Kitchen MyChart Message (if you have MyChart) OR . A paper copy in the mail If you have any lab test that is abnormal or we need to change your treatment, we will call you to review the results.  Testing/Procedures: Your physician has requested that you have an echocardiogram. Echocardiography is a painless test that uses sound waves to create images of your heart. It provides your doctor with information about the size and shape of your heart and how well your heart's chambers and valves are working. This procedure takes approximately one hour. There are no restrictions for this procedure.  Your physician has recommended that you wear a 24-HOUR holter monitor. Holter monitors are medical devices that record the heart's electrical activity. Doctors most often use these monitors to diagnose arrhythmias. Arrhythmias are problems with the speed or rhythm of the heartbeat. The monitor is a small, portable device. You can wear one while you do your normal daily activities. This is usually used to diagnose what is causing palpitations/syncope (passing out).   Follow-Up: . Your physician recommends that you schedule a follow-up appointment TO BE DETERMINED WITH DR. Shirlee Latch.

## 2019-02-15 ENCOUNTER — Telehealth: Payer: Self-pay

## 2019-02-15 NOTE — Telephone Encounter (Signed)
Notes recorded by Sigurd Sos, RN on 02/15/2019 at 8:24 AM EDT Tried calling patient, no answer 3/9 ------

## 2019-02-15 NOTE — Telephone Encounter (Signed)
-----   Message from Beatrice Lecher, [place] sent at 02/15/2019  7:47 AM EDT ----- Creatinine normal.  K+ normal.  Mg2+ normal.   Recommendations:  - Continue current medications and follow up as planned.  Tereso Newcomer, PA-C    02/15/2019 7:42 AM

## 2019-02-18 ENCOUNTER — Telehealth: Payer: Self-pay | Admitting: Physician Assistant

## 2019-02-18 NOTE — Telephone Encounter (Signed)
Please call patient. Tell him I reviewed with Dr. Shirlee Latch. He would like Mr. Boxer to follow up with him in the Heart Failure Clinic. Please arrange a follow up with Dr. Shirlee Latch in the CHF Clinic in 6 months. Tereso Newcomer, PA-C    02/18/2019 11:21 AM

## 2019-02-18 NOTE — Telephone Encounter (Signed)
-----   Message from Laurey Morale, MD sent at 02/12/2019  5:04 PM EST ----- Yes he can see me in HF clinic if you would set up followup.  ----- Message ----- From: Kennon Rounds Sent: 02/12/2019  11:48 AM EST To: Laurey Morale, MD  [place] - With his probable cardiac sarcoid and LV apical aneurysm, do you want to have him FU with you at the HF clinic? Thanks, [employer]

## 2019-02-18 NOTE — Telephone Encounter (Signed)
ATTEMPTED TO CONTACT PT ON HOME NUMBER NO VM  LMOVM OF WIFE CELL NUMBER OF FOLLOW UP WITH [facility]LE[facility] IN 6 MONTHS.

## 2019-03-13 ENCOUNTER — Other Ambulatory Visit: Payer: Self-pay | Admitting: Internal Medicine

## 2019-03-15 NOTE — Telephone Encounter (Signed)
Refill Request.  

## 2019-03-15 NOTE — Telephone Encounter (Signed)
Pt is overdue for follow-up.  Pt last seen 12/14/18.  Pt needs appt for refill.

## 2019-03-15 NOTE — Telephone Encounter (Signed)
Pt scheduled appt for 03/18/19 to get INR checked, note place d on appt to refill rx at that time.

## 2019-03-17 ENCOUNTER — Telehealth: Payer: Self-pay

## 2019-03-17 NOTE — Telephone Encounter (Signed)

## 2019-03-18 ENCOUNTER — Other Ambulatory Visit: Payer: Self-pay

## 2019-03-18 ENCOUNTER — Other Ambulatory Visit: Payer: Medicaid Other

## 2019-03-18 ENCOUNTER — Ambulatory Visit (INDEPENDENT_AMBULATORY_CARE_PROVIDER_SITE_OTHER): Payer: Medicaid Other | Admitting: Pharmacist

## 2019-03-18 DIAGNOSIS — I253 Aneurysm of heart: Secondary | ICD-10-CM | POA: Diagnosis not present

## 2019-03-18 DIAGNOSIS — Z7902 Long term (current) use of antithrombotics/antiplatelets: Secondary | ICD-10-CM

## 2019-03-18 DIAGNOSIS — I10 Essential (primary) hypertension: Secondary | ICD-10-CM

## 2019-03-18 DIAGNOSIS — I2510 Atherosclerotic heart disease of native coronary artery without angina pectoris: Secondary | ICD-10-CM

## 2019-03-18 LAB — HEPATIC FUNCTION PANEL
ALT: 21 IU/L (ref 0–44)
AST: 20 IU/L (ref 0–40)
Albumin: 3.7 g/dL — ABNORMAL LOW (ref 3.8–4.9)
Alkaline Phosphatase: 84 IU/L (ref 39–117)
Bilirubin Total: 0.3 mg/dL (ref 0.0–1.2)
Bilirubin, Direct: 0.1 mg/dL (ref 0.00–0.40)
Total Protein: 6.5 g/dL (ref 6.0–8.5)

## 2019-03-18 LAB — LIPID PANEL
Chol/HDL Ratio: 4.9 ratio (ref 0.0–5.0)
Cholesterol, Total: 193 mg/dL (ref 100–199)
HDL: 39 mg/dL — ABNORMAL LOW (ref >39)
LDL Calculated: 128 mg/dL — ABNORMAL HIGH (ref 0–99)
Triglycerides: 131 mg/dL (ref 0–149)
VLDL Cholesterol Cal: 26 mg/dL (ref 5–40)

## 2019-03-18 LAB — POCT INR: INR: 2.2 (ref 2.0–3.0)

## 2019-03-18 MED ORDER — WARFARIN SODIUM 10 MG PO TABS: ORAL_TABLET | ORAL | 0 refills | Status: DC

## 2019-03-19 ENCOUNTER — Telehealth: Payer: Self-pay | Admitting: Cardiology

## 2019-03-19 ENCOUNTER — Telehealth: Payer: Self-pay | Admitting: Radiology

## 2019-03-19 ENCOUNTER — Telehealth: Payer: Self-pay | Admitting: *Deleted

## 2019-03-19 ENCOUNTER — Other Ambulatory Visit: Payer: Self-pay | Admitting: *Deleted

## 2019-03-19 DIAGNOSIS — I493 Ventricular premature depolarization: Secondary | ICD-10-CM

## 2019-03-19 NOTE — Telephone Encounter (Signed)
Enrolled patient for a 3 Day Zio long term Holter to be mailed due to Covid-19. Patient knows to expect monitor in 3-4 days.  *24hr was changed to 3 day Zio

## 2019-03-19 NOTE — Telephone Encounter (Signed)
Please change to Zio for 3 days and mail to him. Thanks Tereso Newcomer, PA-C    03/19/2019 12:07 PM

## 2019-03-19 NOTE — Telephone Encounter (Signed)
Per pt called - he would like to know wether or not he should still get the monitor put on on Tuesday 14th April.  Pt r/s the Echo.  Please give him a call.

## 2019-03-19 NOTE — Telephone Encounter (Signed)
SPOKE WITH PT AND LET THEM KNOW [facility] HAS BEEN CONTACTED TO SEE IF MONITOR  CAN BE MAILED TO PT HOUSE.  PT WAS TOLD HE WOULD BE CONTACTED BACK AS SOON AS I  GET FEEDBACK THIS CAN HAPPEN  PT GAVE GOOD CONTACT NUMBER TO CALL BACK   AND  TO LEAVE MESSAGE. IF HES UNAVAILABLE  [phone]

## 2019-03-23 ENCOUNTER — Other Ambulatory Visit (HOSPITAL_COMMUNITY): Payer: Medicaid Other

## 2019-03-23 ENCOUNTER — Ambulatory Visit (INDEPENDENT_AMBULATORY_CARE_PROVIDER_SITE_OTHER): Payer: Medicaid Other

## 2019-03-23 DIAGNOSIS — I493 Ventricular premature depolarization: Secondary | ICD-10-CM

## 2019-04-01 ENCOUNTER — Other Ambulatory Visit: Payer: Self-pay

## 2019-04-02 ENCOUNTER — Encounter: Payer: Self-pay | Admitting: Physician Assistant

## 2019-04-12 ENCOUNTER — Telehealth: Payer: Self-pay

## 2019-04-12 NOTE — Telephone Encounter (Signed)
Unable to lmom

## 2019-05-17 ENCOUNTER — Other Ambulatory Visit: Payer: Self-pay

## 2019-05-17 ENCOUNTER — Ambulatory Visit (INDEPENDENT_AMBULATORY_CARE_PROVIDER_SITE_OTHER): Payer: Medicaid Other | Admitting: *Deleted

## 2019-05-17 DIAGNOSIS — Z7902 Long term (current) use of antithrombotics/antiplatelets: Secondary | ICD-10-CM

## 2019-05-17 DIAGNOSIS — I253 Aneurysm of heart: Secondary | ICD-10-CM

## 2019-05-17 LAB — POCT INR: INR: 1.6 — AB (ref 2.0–3.0)

## 2019-05-17 MED ORDER — WARFARIN SODIUM 10 MG PO TABS: ORAL_TABLET | ORAL | 0 refills | Status: DC

## 2019-05-17 NOTE — Patient Instructions (Addendum)
Description    Take 15mg  today and take 20mg  tomorrow then continue taking10mg  daily except 15mg  on Tuesdays and Saturdays.  Recheck in 1 week. Call us with any medication changes or concerns # [phone].

## 2019-05-20 ENCOUNTER — Telehealth: Payer: Self-pay

## 2019-05-20 NOTE — Telephone Encounter (Signed)
Unable to lmom for prescreen  

## 2019-06-16 ENCOUNTER — Other Ambulatory Visit (HOSPITAL_COMMUNITY): Payer: Medicaid Other

## 2019-06-25 ENCOUNTER — Ambulatory Visit: Payer: Medicaid Other | Admitting: *Deleted

## 2019-06-25 ENCOUNTER — Other Ambulatory Visit: Payer: Self-pay

## 2019-06-25 DIAGNOSIS — Z7902 Long term (current) use of antithrombotics/antiplatelets: Secondary | ICD-10-CM | POA: Diagnosis not present

## 2019-06-25 DIAGNOSIS — I253 Aneurysm of heart: Secondary | ICD-10-CM | POA: Diagnosis not present

## 2019-06-25 LAB — POCT INR: INR: 2.3 (ref 2.0–3.0)

## 2019-06-25 MED ORDER — WARFARIN SODIUM 10 MG PO TABS: ORAL_TABLET | ORAL | 0 refills | Status: DC

## 2019-06-25 NOTE — Patient Instructions (Signed)
Description   Continue taking10mg  daily except 15mg  on Tuesdays and Saturdays.  Recheck in 3 weeks. Call us with any medication changes or concerns # [phone].

## 2019-08-09 ENCOUNTER — Other Ambulatory Visit (HOSPITAL_COMMUNITY): Payer: Medicaid Other

## 2019-09-01 ENCOUNTER — Other Ambulatory Visit: Payer: Self-pay

## 2019-09-01 ENCOUNTER — Ambulatory Visit (INDEPENDENT_AMBULATORY_CARE_PROVIDER_SITE_OTHER): Payer: Medicaid Other | Admitting: *Deleted

## 2019-09-01 DIAGNOSIS — Z7902 Long term (current) use of antithrombotics/antiplatelets: Secondary | ICD-10-CM | POA: Diagnosis not present

## 2019-09-01 DIAGNOSIS — I253 Aneurysm of heart: Secondary | ICD-10-CM | POA: Diagnosis not present

## 2019-09-01 LAB — POCT INR: INR: 1.8 — AB (ref 2.0–3.0)

## 2019-09-01 MED ORDER — WARFARIN SODIUM 10 MG PO TABS: ORAL_TABLET | ORAL | 0 refills | Status: DC

## 2019-09-01 NOTE — Patient Instructions (Addendum)
Description   Today and tomorrow take 15mg  then continue taking 10mg  daily except 15mg  on Tuesdays and Saturdays.  Recheck in 3 weeks. Call us with any medication changes or concerns # [phone].

## 2019-09-03 IMAGING — CT CT CHEST W/O CM
2 of 3 series · 15 of 36 positions shown, 18 images · non-contrast
Comparison: 11/09/2014.

CLINICAL DATA: Sarcoid.

EXAM:
CT CHEST WITHOUT CONTRAST
TECHNIQUE: Multidetector CT imaging of the chest was performed following the
standard protocol without IV contrast.

[Series 2: thorax · axial · 0.76mm/px · z∈[-346,-98]mm · 12 of 146 slices shown, 15 images]
[im 11/146  mediastinal]
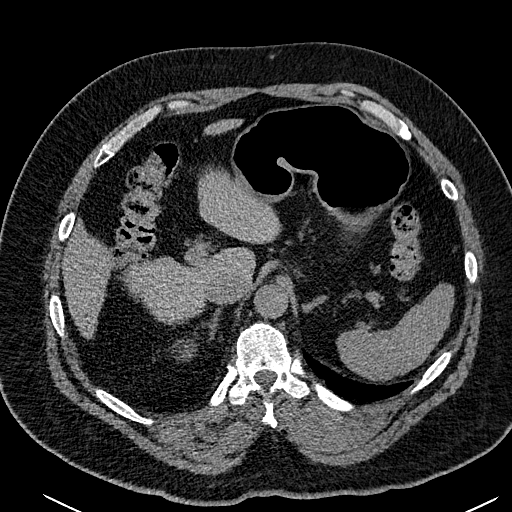
[im 11/146  lung]
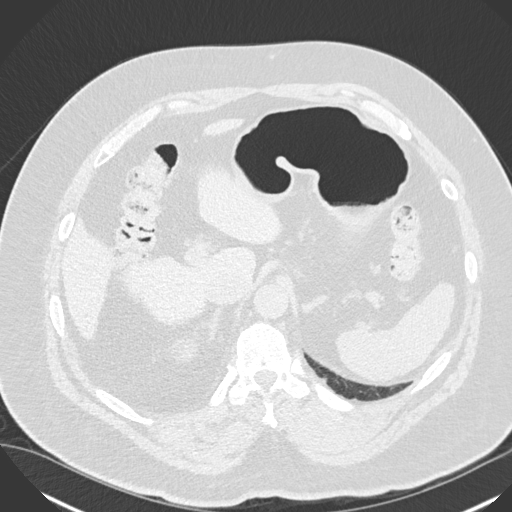
[im 22/146  lung]
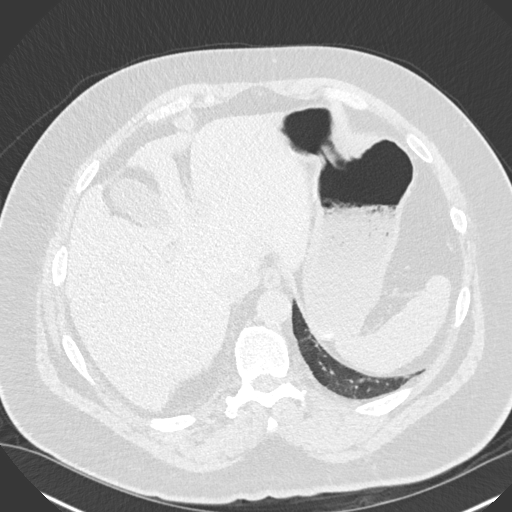
[im 33/146  lung]
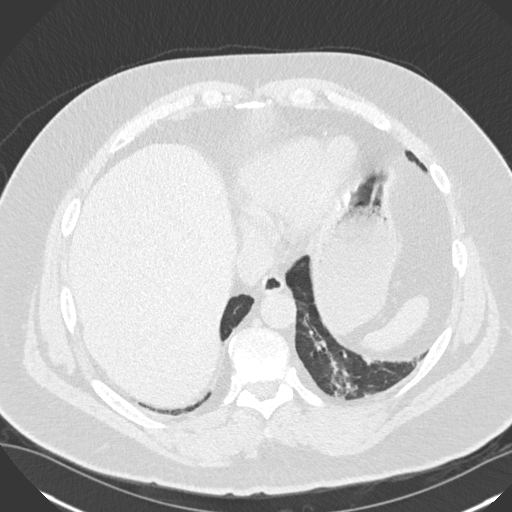
[im 43/146  lung]
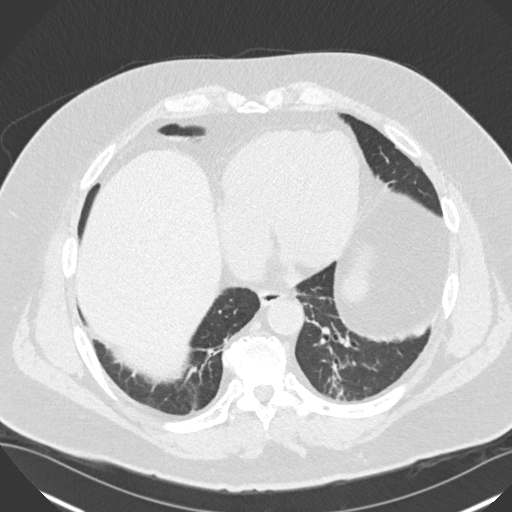
[im 54/146  mediastinal]
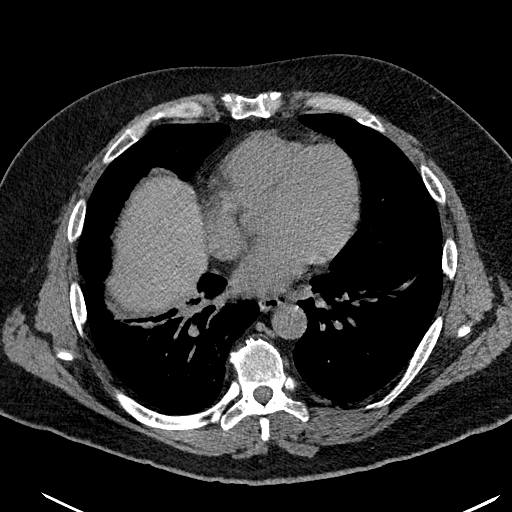
[im 54/146  lung]
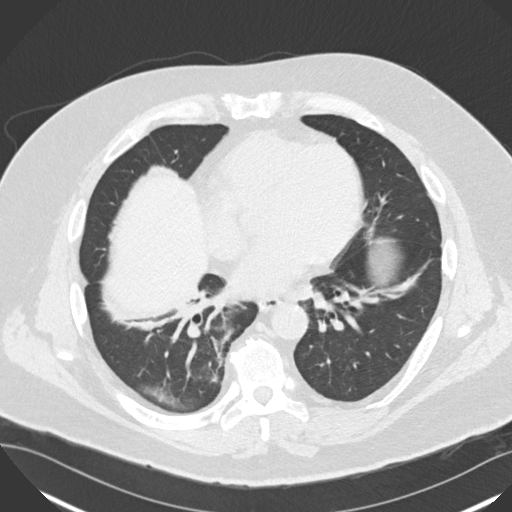
[im 65/146  lung]
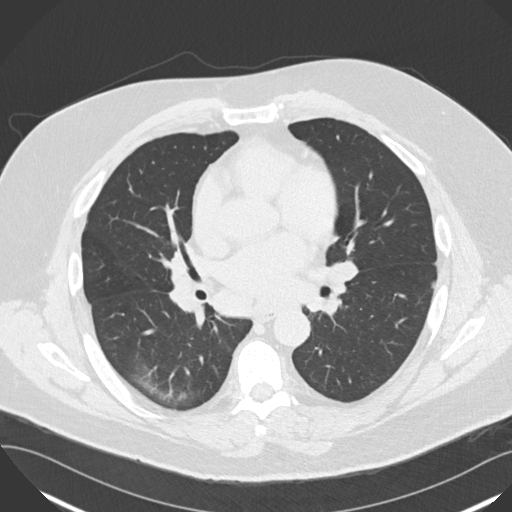
[im 81/146  lung]
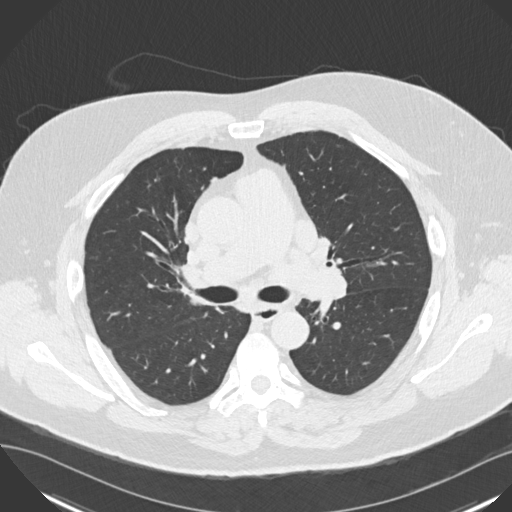
[im 92/146  lung]
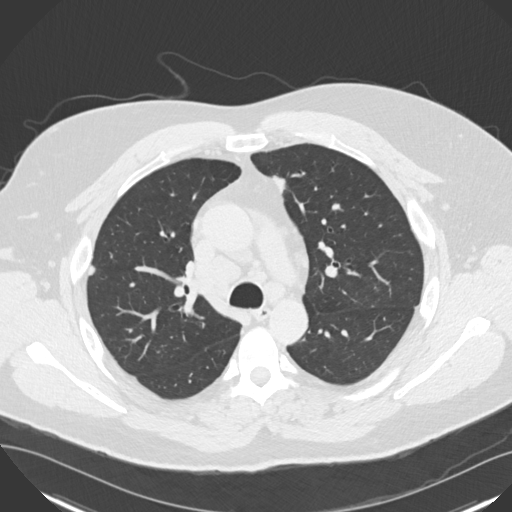
[im 103/146  mediastinal]
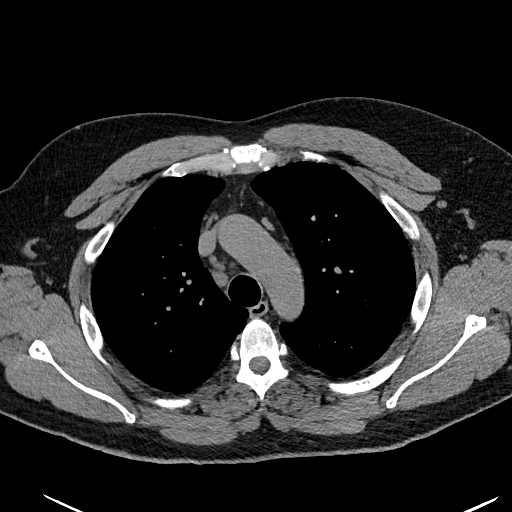
[im 103/146  lung]
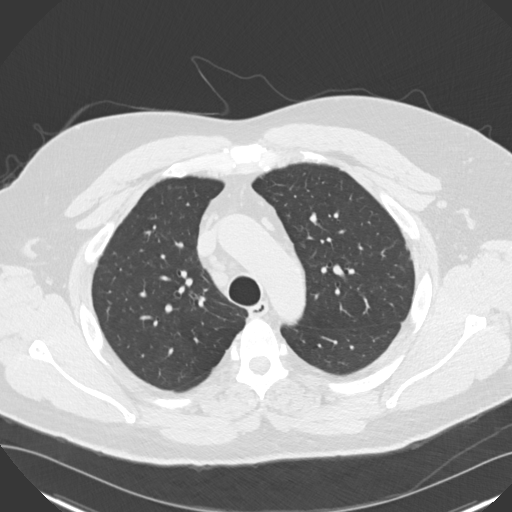
[im 113/146  lung]
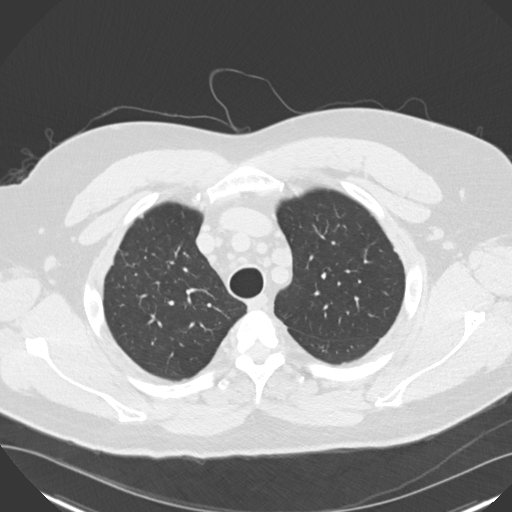
[im 124/146  lung]
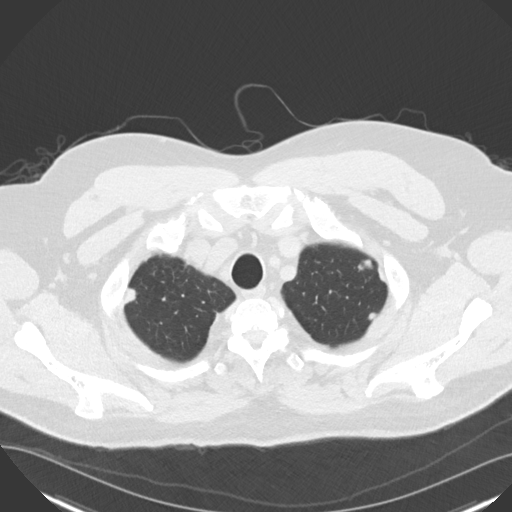
[im 135/146  lung]
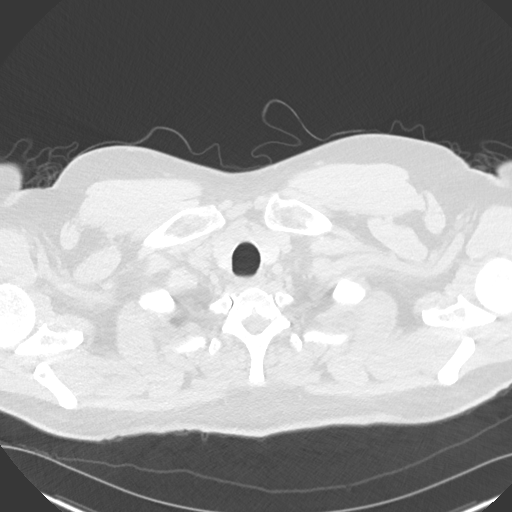

[Series 6: coronal · coronal · 0.59mm/px · 3 of 151 slices shown]
[im 31/151  lung]
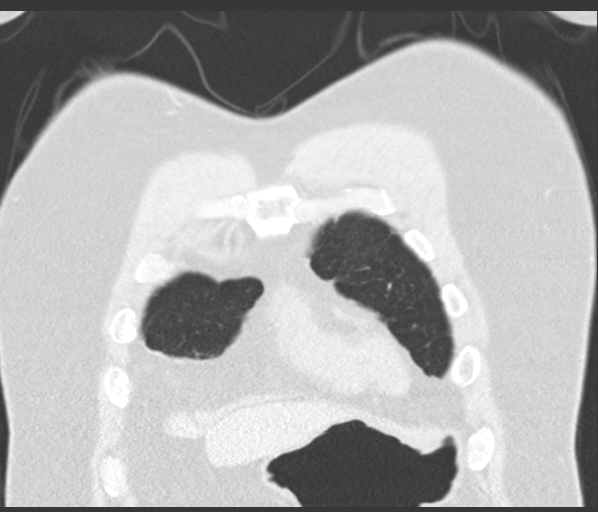
[im 61/151  lung]
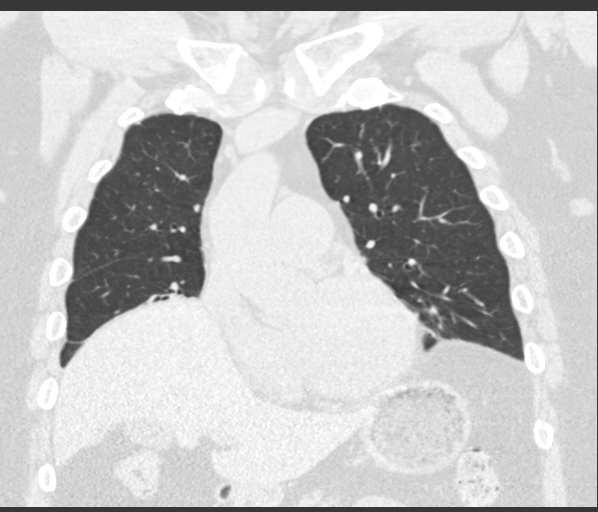
[im 91/151  lung]
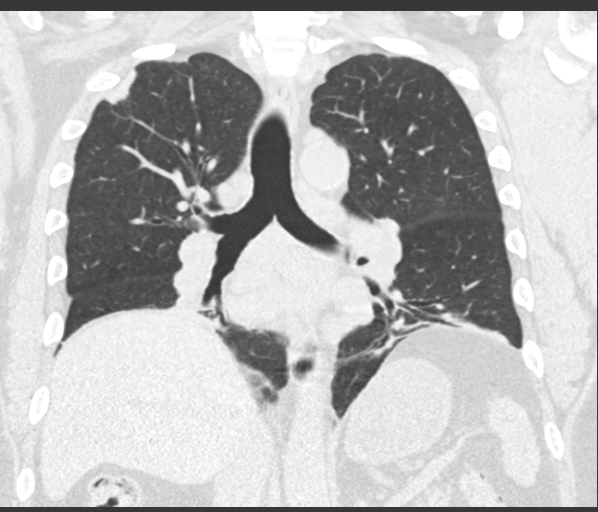

[15 of 36 positions shown; findings below may reference images not displayed]

FINDINGS: Cardiovascular: Atherosclerotic calcification of the aorta with an
ulcerative plaque along the lateral transverse aorta. Pulmonary
arteries are enlarged. Heart is at the upper limits of normal in
size. No pericardial effusion.

Mediastinum/Nodes: Mediastinal lymph nodes measure up to 2.3 cm in
the low right paratracheal station, similar. Bihilar adenopathy is
difficult to measure without IV contrast. No axillary adenopathy.
Esophagus is grossly unremarkable.

Lungs/Pleura: Centrilobular emphysema. Subpleural nodular lesions
are seen in the lungs bilaterally. Bronchiectasis, mild
architectural distortion and volume loss in the lower lobes,
slightly progressive. No pleural fluid. Airway is unremarkable.

Upper Abdomen: Liver, gallbladder and visualized portions of the
adrenal glands, right kidney, spleen, pancreas, stomach and bowel
are grossly unremarkable. Periportal lymph node measures 11 mm.

Musculoskeletal: No worrisome lytic or sclerotic lesions.
IMPRESSION: 1. Mediastinal, hilar and pleuroparenchymal findings of sarcoid.
Minimal increase in bibasilar bronchiectasis, architectural
distortion and volume loss.
2.  Aortic atherosclerosis (21SMU-170.0).
3. Enlarged pulmonary arteries, indicative of pulmonary arterial
hypertension.
4.  Emphysema (21SMU-4GZ.N).

## 2019-09-06 ENCOUNTER — Other Ambulatory Visit: Payer: Self-pay | Admitting: Physician Assistant

## 2019-09-10 ENCOUNTER — Ambulatory Visit (HOSPITAL_COMMUNITY): Payer: Medicaid Other | Attending: Internal Medicine

## 2019-09-10 ENCOUNTER — Other Ambulatory Visit: Payer: Self-pay

## 2019-09-10 DIAGNOSIS — I429 Cardiomyopathy, unspecified: Secondary | ICD-10-CM | POA: Insufficient documentation

## 2019-09-13 ENCOUNTER — Encounter: Payer: Self-pay | Admitting: Physician Assistant

## 2019-10-12 ENCOUNTER — Other Ambulatory Visit: Payer: Self-pay

## 2019-10-12 ENCOUNTER — Ambulatory Visit (INDEPENDENT_AMBULATORY_CARE_PROVIDER_SITE_OTHER): Payer: Medicaid Other | Admitting: Pharmacist

## 2019-10-12 ENCOUNTER — Ambulatory Visit: Payer: Medicaid Other

## 2019-10-12 DIAGNOSIS — I253 Aneurysm of heart: Secondary | ICD-10-CM | POA: Diagnosis not present

## 2019-10-12 DIAGNOSIS — Z7902 Long term (current) use of antithrombotics/antiplatelets: Secondary | ICD-10-CM

## 2019-10-12 LAB — POCT INR: INR: 1.9 — AB (ref 2.0–3.0)

## 2019-10-12 MED ORDER — WARFARIN SODIUM 10 MG PO TABS: ORAL_TABLET | ORAL | 1 refills | Status: DC

## 2019-10-12 NOTE — Patient Instructions (Signed)
Today and tomorrow take 15mg  then continue taking 10mg  daily except 15mg  on Tuesdays and Saturdays.  Recheck in 3 weeks. Call us with any medication changes or concerns # [phone].

## 2019-11-01 ENCOUNTER — Encounter (HOSPITAL_COMMUNITY): Payer: Medicaid Other | Admitting: Cardiology

## 2019-12-14 ENCOUNTER — Encounter (HOSPITAL_COMMUNITY): Payer: Medicaid Other | Admitting: Cardiology

## 2020-01-04 ENCOUNTER — Other Ambulatory Visit: Payer: Self-pay

## 2020-01-04 ENCOUNTER — Ambulatory Visit (INDEPENDENT_AMBULATORY_CARE_PROVIDER_SITE_OTHER): Payer: Medicaid Other | Admitting: *Deleted

## 2020-01-04 DIAGNOSIS — Z7902 Long term (current) use of antithrombotics/antiplatelets: Secondary | ICD-10-CM | POA: Diagnosis not present

## 2020-01-04 DIAGNOSIS — I253 Aneurysm of heart: Secondary | ICD-10-CM

## 2020-01-04 LAB — POCT INR: INR: 3 (ref 2.0–3.0)

## 2020-01-04 MED ORDER — WARFARIN SODIUM 10 MG PO TABS: ORAL_TABLET | ORAL | 1 refills | Status: DC

## 2020-01-04 NOTE — Patient Instructions (Signed)
Description   Continue taking 10mg  daily except 15mg  on Tuesdays and Saturdays.  Recheck in 6 weeks. Call [id number] with any medication changes or concerns # [phone].

## 2020-01-20 ENCOUNTER — Ambulatory Visit (HOSPITAL_COMMUNITY)
Admission: RE | Admit: 2020-01-20 | Discharge: 2020-01-20 | Disposition: A | Discharge status: Home / Self Care | Payer: Medicaid Other | Source: Ambulatory Visit | Attending: Cardiology | Admitting: Cardiology

## 2020-01-20 ENCOUNTER — Encounter (HOSPITAL_COMMUNITY): Payer: Self-pay | Admitting: Cardiology

## 2020-01-20 ENCOUNTER — Other Ambulatory Visit: Payer: Self-pay

## 2020-01-20 VITALS — BP 153/97 | HR 71 | Wt 317.6 lb

## 2020-01-20 DIAGNOSIS — F40240 Claustrophobia: Secondary | ICD-10-CM | POA: Insufficient documentation

## 2020-01-20 DIAGNOSIS — I253 Aneurysm of heart: Secondary | ICD-10-CM | POA: Diagnosis not present

## 2020-01-20 DIAGNOSIS — Z8673 Personal history of transient ischemic attack (TIA), and cerebral infarction without residual deficits: Secondary | ICD-10-CM | POA: Insufficient documentation

## 2020-01-20 DIAGNOSIS — D8685 Sarcoid myocarditis: Secondary | ICD-10-CM | POA: Insufficient documentation

## 2020-01-20 DIAGNOSIS — E7849 Other hyperlipidemia: Secondary | ICD-10-CM | POA: Diagnosis not present

## 2020-01-20 DIAGNOSIS — I10 Essential (primary) hypertension: Secondary | ICD-10-CM | POA: Insufficient documentation

## 2020-01-20 DIAGNOSIS — D869 Sarcoidosis, unspecified: Secondary | ICD-10-CM

## 2020-01-20 DIAGNOSIS — Z79899 Other long term (current) drug therapy: Secondary | ICD-10-CM | POA: Insufficient documentation

## 2020-01-20 DIAGNOSIS — Z7901 Long term (current) use of anticoagulants: Secondary | ICD-10-CM | POA: Diagnosis not present

## 2020-01-20 DIAGNOSIS — I429 Cardiomyopathy, unspecified: Secondary | ICD-10-CM | POA: Diagnosis not present

## 2020-01-20 DIAGNOSIS — I5022 Chronic systolic (congestive) heart failure: Secondary | ICD-10-CM | POA: Diagnosis not present

## 2020-01-20 DIAGNOSIS — I493 Ventricular premature depolarization: Secondary | ICD-10-CM | POA: Diagnosis not present

## 2020-01-20 DIAGNOSIS — E785 Hyperlipidemia, unspecified: Secondary | ICD-10-CM | POA: Insufficient documentation

## 2020-01-20 LAB — BASIC METABOLIC PANEL
Anion gap: 8 (ref 5–15)
BUN: 17 mg/dL (ref 6–20)
CO2: 21 mmol/L — ABNORMAL LOW (ref 22–32)
Calcium: 9.1 mg/dL (ref 8.9–10.3)
Chloride: 110 mmol/L (ref 98–111)
Creatinine, Ser: 1.19 mg/dL (ref 0.61–1.24)
GFR calc Af Amer: >60 mL/min (ref >60)
GFR calc non Af Amer: >60 mL/min (ref >60)
Glucose, Bld: 109 mg/dL — ABNORMAL HIGH (ref 70–99)
Potassium: 4.3 mmol/L (ref 3.5–5.1)
Sodium: 139 mmol/L (ref 135–145)

## 2020-01-20 LAB — LIPID PANEL
Cholesterol: 215 mg/dL — ABNORMAL HIGH (ref 0–200)
HDL: 41 mg/dL (ref >40)
LDL Cholesterol: 149 mg/dL — ABNORMAL HIGH (ref 0–99)
Total CHOL/HDL Ratio: 5.2 RATIO
Triglycerides: 126 mg/dL (ref <150)
VLDL: 25 mg/dL (ref 0–40)

## 2020-01-20 MED ORDER — CARVEDILOL 12.5 MG PO TABS: 12.5000 mg | ORAL_TABLET | Freq: Two times a day (BID) | ORAL | 3 refills | Status: DC

## 2020-01-20 NOTE — Patient Instructions (Signed)
INCREASE Coreg to 12.5mg  (1 tab) twice a day  Labs today We will only contact you if something comes back abnormal or we need to make some changes. Otherwise no news is good news!   Your physician recommends that you schedule a follow-up appointment in: 6 months with Dr Shirlee Latch. Our office will call you to schedule your appointment  Please call office at [phone] option 2 if you have any questions or concerns.    At the [facility], you and your health needs are our priority. As part of our continuing mission to provide you with exceptional heart care, we have created designated Provider Care Teams. These Care Teams include your primary Cardiologist (physician) and Advanced Practice Providers (APPs- Physician Assistants and Nurse Practitioners) who all work together to provide you with the care you need, when you need it.   You may see any of the following providers on your designated Care Team at your next follow up: Marland Kitchen Dr Arvilla Meres . Dr [name] . Tonye Becket, NP . Robbie Lis, PA . Karle Plumber, PharmD   Please be sure to bring in all your medications bottles to every appointment.

## 2020-01-20 NOTE — Progress Notes (Signed)
Patient ID: Tony Howard, male   DOB: 1961-05-02, 59 y.o.   MRN: [id number] PCP: Dr. Pecola Leisure Cardiology: Dr. Shirlee Latch  59 y.o. 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  Last echo in 10/20 showed EF 55-60%, unable to visualize apical aneurysm. Last CT chest in 1/20 showed hilar, mediastinal, and pleuroparenchymal signs of sarcoidosis.  Cardiolite in 1/17 showed small, partially reversible apical defect, EF 53%, low risk.  He was not able to do a cardiac MRI due to claustrophobia.   Sarcoidosis seems to have been quiescent recently. He has never had definitively proven cardiac sarcoidosis but apical aneurysm in absence of coronary disease was suggestive.  He has been doing well since left THR, walks without dyspnea. No chest pain.  No problems with a flight of stairs.  No syncope, no palpitations.   Labs (4/15): K 4.1, creatinine 1.2, LDL 168 Labs (10/15): ACE level 4 (low), K 4.7, creatinine 1.5, pro-BNP 12 Labs (2/17): LDL 115, HDL 43 Labs (4/20): LDL 128, K 4.4, creatinine 1.2  ECG: NSR, nonspecific T wave changes (personally reviewed)  PMH:  1. Sarcoidosis: Pulmonary and cutaneous.  CT chest in 4/13 showed hilar and mediastinal lymphadenopathy but no significant parenchymal involvement.  PFTs (4/13) showed mild restriction.  Patient follows with Dr. Shelle Iron. Suspect cardiac involvement with non-CAD-related LV aneurysm.  PFTs (11/15): FVC 58%, FEV1 59%, ration 101%, TLC 57%, DLCO 53% (moderate-severe obstruction, severe parenchymal restriction).  CT chest (12/15) with bilateral hilar and mediastinal lymphadenopathy, pleural-based nodules.  - CT chest (1/20): mediastinal, hilar, and pleuroparenchymal signs of sarcoidosis, bibasilar bronchiectasis, emphysema.  2. HTN 3. OA left hip s/p THR in 2017.   4. H/o CVA 5. Contrast allergy 6. Cardiomyopathy: Admitted in 4/13 with CP.  Myoview showed EF 47% with anterior ischemia. Echo showed  EF 65%, apex poorly visualized.  LHC (4/13) showed no significant coronary disease but there was a small LV apical aneurysm. LV aneurysms have been seen with cardiac sarcoidosis, and I suspect this is the cause.  We planned an MRI to evaluate but patient was too claustrophobic and could not do the study.  Lexiscan Cardiolite (7/14) with EF 47%, no ischemia or infarction.  Echo (7/14) with EF 55-60%, mild MR, PA systolic pressure 33 mmHg, normal RV size and systolic function, LV apical aneurysm was not visualized on this echo.  Echo (10/15) with EF 55-60%, mild LVH, LV apical aneurysm was not visualized.  - Echo (1/17) with EF 55%, unable to visualize apical aneurysm. - Cardiolite (1/17) with small, partially reversible apical defect, EF 53%, low risk.  - Echo (10/20): EF 55-60%, mild LVH, normal RV, normal IVC.  7. PVCs: Holter in 9/20 showed 7.5% PVCs.   FH: Sister with multiple sclerosis.   SH: Married, nonsmoker, unemployed (used to work in [employer]).  H/o cocaine abuse.   ROS: All systems reviewed and negative except as per HPI.   Current Outpatient Medications  Medication Sig Dispense Refill  . carvedilol (COREG) 12.5 MG tablet Take 1 tablet (12.5 mg total) by mouth 2 (two) times daily with a meal. 60 tablet 3  . hydrochlorothiazide (HYDRODIURIL) 25 MG tablet Take 1 tablet (25 mg total) by mouth daily. 90 tablet 3  . lisinopril (ZESTRIL) 30 MG tablet Take 1 tablet by mouth daily 90 tablet 1  . rosuvastatin (CRESTOR) 20 MG tablet Take 1 tablet (20 mg total) by mouth daily. 90 tablet 3  . warfarin (COUMADIN) 10  MG tablet Take 1 tablet daily except 1.5 tablets on Tuesday and Saturdays or as directed by Anticoagulation Clinic. 35 tablet 1   No current facility-administered medications for this encounter.    BP (!) 153/97   Pulse 71   Wt (!) 144.1 kg (317 lb 9.6 oz)   SpO2 100%   BMI 38.66 kg/m  General: NAD Neck: No JVD, no thyromegaly or thyroid nodule.  Lungs: Clear to  auscultation bilaterally with normal respiratory effort. CV: Nondisplaced PMI.  Heart regular S1/S2, no S3/S4, no murmur.  No peripheral edema.  No carotid bruit.  Normal pedal pulses.  Abdomen: Soft, nontender, no hepatosplenomegaly, no distention.  Skin: Intact without lesions or rashes.  Neurologic: Alert and oriented x 3.  Psych: Normal affect. Extremities: No clubbing or cyanosis.  HEENT: Normal.   Assessment/Plan: 1. Cardiomyopathy: Patient has been noted in the past to have a small LV apical aneurysm.  He was unable to do a cardiac MRI due to claustrophobia.  He had no coronary disease on angiography in the past.  I suspect that the aneurysm was caused by cardiac sarcoidosis.  LV aneurysm formation has been described with cardiac sarcoidosis.  He had a CVA in the past so is on chronic coumadin.  Most recent echo in 10/20 did not visualize the LV apical aneurysm.  - Sarcoidosis seems quiescent.  If cardiac symptoms were to worsen in the future, would get cardiac PET to make definitive diagnosis of cardiac sarcoidosis.  2. Chest pain: Atypical chest pain in the past.  He had coronary angiography in 2013 without significant CAD.  Last Cardiolite in 1/17 was low risk.  3. Hyperlipidemia: He is now on Crestor.  Check lipids today.  4. Sarcoidosis: There was evidence for sarcoidosis on 12/15 and again on CT in 1/20.  He is due to see Dr. Lamonte Sakai soon.  5. 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  7.5% PVCs on 9/20 holter.  - Continue Coreg.   Followup in 6 months.   Loralie Champagne 01/20/2020

## 2020-01-24 ENCOUNTER — Telehealth (HOSPITAL_COMMUNITY): Payer: Self-pay

## 2020-01-24 DIAGNOSIS — E7849 Other hyperlipidemia: Secondary | ICD-10-CM

## 2020-01-24 MED ORDER — ROSUVASTATIN CALCIUM 40 MG PO TABS: 40.0000 mg | ORAL_TABLET | Freq: Every day | ORAL | 3 refills | Status: DC

## 2020-01-24 MED FILL — ROSUVASTATIN CALCIUM 40 MG: 40 | 90 days supply | Qty: 90 | Fill #0

## 2020-01-24 NOTE — Telephone Encounter (Signed)
-----   Message from Laurey Morale, MD sent at 01/20/2020  8:38 PM EST ----- LDL too high.  Make sure he is taking Crestor 20 mg daily. If he is, increase to 40 mg daily.  Lipids/LFTs in 2 months.

## 2020-03-23 ENCOUNTER — Other Ambulatory Visit (HOSPITAL_COMMUNITY): Payer: Medicaid Other

## 2020-03-30 ENCOUNTER — Other Ambulatory Visit: Payer: Self-pay

## 2020-03-30 ENCOUNTER — Ambulatory Visit (INDEPENDENT_AMBULATORY_CARE_PROVIDER_SITE_OTHER): Payer: Medicaid Other | Admitting: *Deleted

## 2020-03-30 DIAGNOSIS — Z7902 Long term (current) use of antithrombotics/antiplatelets: Secondary | ICD-10-CM

## 2020-03-30 DIAGNOSIS — I253 Aneurysm of heart: Secondary | ICD-10-CM | POA: Diagnosis not present

## 2020-03-30 LAB — POCT INR: INR: 1.6 — AB (ref 2.0–3.0)

## 2020-03-30 MED ORDER — WARFARIN SODIUM 10 MG PO TABS: ORAL_TABLET | ORAL | 0 refills | Status: DC

## 2020-03-30 NOTE — Patient Instructions (Signed)
Description   Take 1.5 tablets today and tomorrow then continue taking 10mg  daily except 15mg  on Tuesdays and Saturdays.  Recheck in 2 weeks. Call 06-16-1973 with any medication changes or concerns # [phone].

## 2020-04-13 ENCOUNTER — Ambulatory Visit (HOSPITAL_COMMUNITY)
Admission: RE | Admit: 2020-04-13 | Discharge: 2020-04-13 | Disposition: A | Discharge status: Home / Self Care | Payer: Medicaid Other | Source: Ambulatory Visit | Attending: Internal Medicine | Admitting: Internal Medicine

## 2020-04-13 ENCOUNTER — Other Ambulatory Visit: Payer: Self-pay

## 2020-04-13 DIAGNOSIS — E7849 Other hyperlipidemia: Secondary | ICD-10-CM | POA: Insufficient documentation

## 2020-04-13 LAB — LIPID PANEL
Cholesterol: 226 mg/dL — ABNORMAL HIGH (ref 0–200)
HDL: 41 mg/dL (ref >40)
LDL Cholesterol: 150 mg/dL — ABNORMAL HIGH (ref 0–99)
Total CHOL/HDL Ratio: 5.5 RATIO
Triglycerides: 174 mg/dL — ABNORMAL HIGH (ref <150)
VLDL: 35 mg/dL (ref 0–40)

## 2020-04-13 LAB — HEPATIC FUNCTION PANEL
ALT: 24 U/L (ref 0–44)
AST: 19 U/L (ref 15–41)
Albumin: 2.9 g/dL — ABNORMAL LOW (ref 3.5–5.0)
Alkaline Phosphatase: 63 U/L (ref 38–126)
Bilirubin, Direct: 0.1 mg/dL (ref 0.0–0.2)
Indirect Bilirubin: 0.8 mg/dL (ref 0.3–0.9)
Total Bilirubin: 0.9 mg/dL (ref 0.3–1.2)
Total Protein: 6.6 g/dL (ref 6.5–8.1)

## 2020-04-17 ENCOUNTER — Telehealth (HOSPITAL_COMMUNITY): Payer: Self-pay

## 2020-04-17 DIAGNOSIS — E7849 Other hyperlipidemia: Secondary | ICD-10-CM

## 2020-04-17 MED ORDER — ROSUVASTATIN CALCIUM 40 MG PO TABS: 40.0000 mg | ORAL_TABLET | Freq: Every day | ORAL | 3 refills | Status: DC

## 2020-04-17 NOTE — Telephone Encounter (Signed)
Pt states he is taking crestor 20mg  daily. Advised need for increase in crestor dose to 40mg  daily based on elevated cholesterol levels on recent labs. Verbalized understanding. Pt states he will call our office to schedule an appointment for labs as he is going out of town in 2 months.

## 2020-04-17 NOTE — Telephone Encounter (Signed)
-----   Message from Laurey Morale, MD sent at 04/16/2020 10:07 PM EDT ----- Is he taking Crestor 20 mg daily? If so, should increase Crestor to 40 mg daily with lipids/LFTs in 2 months because of high LDL.

## 2020-05-05 ENCOUNTER — Other Ambulatory Visit: Payer: Self-pay

## 2020-05-05 ENCOUNTER — Ambulatory Visit (INDEPENDENT_AMBULATORY_CARE_PROVIDER_SITE_OTHER): Payer: Medicaid Other

## 2020-05-05 DIAGNOSIS — I253 Aneurysm of heart: Secondary | ICD-10-CM

## 2020-05-05 DIAGNOSIS — Z7902 Long term (current) use of antithrombotics/antiplatelets: Secondary | ICD-10-CM

## 2020-05-05 LAB — POCT INR: INR: 1.5 — AB (ref 2.0–3.0)

## 2020-05-05 MED ORDER — WARFARIN SODIUM 10 MG PO TABS: ORAL_TABLET | ORAL | 0 refills | Status: DC

## 2020-05-05 NOTE — Patient Instructions (Signed)
Description   Take 1.5 tablets x 3 days,  then resume same dosage 10mg  daily except 15mg  on Tuesdays and Saturdays.  Recheck in 2 weeks. Call 06-16-1973 with any medication changes or concerns # [phone].

## 2020-06-05 ENCOUNTER — Other Ambulatory Visit: Payer: Self-pay | Admitting: Physician Assistant

## 2020-06-21 ENCOUNTER — Ambulatory Visit (INDEPENDENT_AMBULATORY_CARE_PROVIDER_SITE_OTHER): Payer: Medicaid Other | Admitting: *Deleted

## 2020-06-21 ENCOUNTER — Other Ambulatory Visit: Payer: Self-pay

## 2020-06-21 DIAGNOSIS — Z7902 Long term (current) use of antithrombotics/antiplatelets: Secondary | ICD-10-CM | POA: Diagnosis not present

## 2020-06-21 DIAGNOSIS — Z5181 Encounter for therapeutic drug level monitoring: Secondary | ICD-10-CM

## 2020-06-21 DIAGNOSIS — I253 Aneurysm of heart: Secondary | ICD-10-CM

## 2020-06-21 LAB — POCT INR: INR: 2.7 (ref 2.0–3.0)

## 2020-06-21 MED ORDER — WARFARIN SODIUM 10 MG PO TABS: ORAL_TABLET | ORAL | 0 refills | Status: DC

## 2020-06-21 NOTE — Patient Instructions (Signed)
Description   Continue same dosage 10mg  daily except 15mg  on Tuesdays and Saturdays.  Recheck in 3 weeks. Call 06-16-1973 with any medication changes or concerns # [phone].

## 2020-07-13 ENCOUNTER — Telehealth: Payer: Self-pay | Admitting: *Deleted

## 2020-07-13 NOTE — Telephone Encounter (Signed)
Pt due to have INR check. Pt in the coumadin clinic follow up book. Attempted to call pt but did not get in touch with him.

## 2020-07-13 NOTE — Telephone Encounter (Signed)
Called and spoke to pt who stated that he was down in [place], [place]. Informed pt that he is due to have his INR checked. Pt stated that there was a [facility] that he could have his INR checked. Asked if pt would go and have the results faxed to [place], pt said no. Informed pt the reason why we want him to have his INR checked is for his safety. Asked pt if we could schedule a follow up appointment when he returns. Pt stated that he would call to schedule an appointment when he gets back.

## 2020-07-31 ENCOUNTER — Other Ambulatory Visit: Payer: Self-pay | Admitting: Physician Assistant

## 2020-08-02 ENCOUNTER — Telehealth: Payer: Self-pay

## 2020-08-02 NOTE — Telephone Encounter (Signed)
Unable to lmom for overdue inr 

## 2020-08-02 NOTE — Telephone Encounter (Signed)
Patient returned call, he stated he will callback to schedule when he gets back.

## 2020-08-02 NOTE — Telephone Encounter (Signed)
Will route to [employer] supervisor of the coumadin clinic to see if he wants to end his episode since the pt doesn't wish to come in.Marland KitchenMarland Kitchen

## 2020-08-02 NOTE — Telephone Encounter (Signed)
Patient will be in [place] until after labor day.  Please postpone INR reminder until 9/7

## 2020-08-29 ENCOUNTER — Telehealth: Payer: Self-pay | Admitting: *Deleted

## 2020-08-29 NOTE — Telephone Encounter (Signed)
Called pt since he is overdue for Anticoagulation Appt; set an appt for Thursday at pt request.

## 2020-08-31 ENCOUNTER — Other Ambulatory Visit: Payer: Self-pay

## 2020-08-31 ENCOUNTER — Ambulatory Visit (INDEPENDENT_AMBULATORY_CARE_PROVIDER_SITE_OTHER): Payer: Medicaid Other

## 2020-08-31 DIAGNOSIS — I253 Aneurysm of heart: Secondary | ICD-10-CM

## 2020-08-31 DIAGNOSIS — Z7902 Long term (current) use of antithrombotics/antiplatelets: Secondary | ICD-10-CM

## 2020-08-31 LAB — POCT INR: INR: 2.8 (ref 2.0–3.0)

## 2020-08-31 MED ORDER — WARFARIN SODIUM 10 MG PO TABS: ORAL_TABLET | ORAL | 0 refills | Status: DC

## 2020-08-31 NOTE — Patient Instructions (Signed)
Description   Continue same dosage 10mg  daily except 15mg  on Tuesdays and Saturdays.  Recheck in  6 weeks. Call 06-16-1973 with any medication changes or concerns # [phone].

## 2020-09-14 ENCOUNTER — Other Ambulatory Visit: Payer: Self-pay | Admitting: Cardiology

## 2020-11-06 ENCOUNTER — Other Ambulatory Visit: Payer: Self-pay | Admitting: Cardiology

## 2020-11-09 ENCOUNTER — Other Ambulatory Visit: Payer: Self-pay | Admitting: Cardiology

## 2020-11-17 ENCOUNTER — Telehealth: Payer: Self-pay | Admitting: *Deleted

## 2020-11-17 NOTE — Telephone Encounter (Signed)
Called pt since he missed his appt today. The phone constantly rang without anyone answering or the voicemail picking up. Will try back at a later time/date.

## 2020-11-20 ENCOUNTER — Telehealth: Payer: Self-pay | Admitting: *Deleted

## 2020-11-20 NOTE — Telephone Encounter (Signed)
Pt overdue to have INR checked. Called pt unable to leave message or get in touch with patient.

## 2020-12-11 ENCOUNTER — Ambulatory Visit (INDEPENDENT_AMBULATORY_CARE_PROVIDER_SITE_OTHER): Payer: Medicaid Other

## 2020-12-11 ENCOUNTER — Other Ambulatory Visit: Payer: Self-pay

## 2020-12-11 DIAGNOSIS — Z5181 Encounter for therapeutic drug level monitoring: Secondary | ICD-10-CM

## 2020-12-11 DIAGNOSIS — Z7902 Long term (current) use of antithrombotics/antiplatelets: Secondary | ICD-10-CM

## 2020-12-11 DIAGNOSIS — I253 Aneurysm of heart: Secondary | ICD-10-CM

## 2020-12-11 LAB — POCT INR: INR: 1.3 — AB (ref 2.0–3.0)

## 2020-12-11 MED ORDER — WARFARIN SODIUM 10 MG PO TABS: ORAL_TABLET | ORAL | 0 refills | Status: DC

## 2020-12-11 NOTE — Patient Instructions (Signed)
-   take 1.5 tablets warfarin today, - take 2 tablets tomorrow,  - then resume same dosage 10mg  daily except 15mg  on Tuesdays and Saturdays.   - Recheck in 5 weeks.  Call 06-16-1973 with any medication changes or concerns # [phone].

## 2021-01-15 ENCOUNTER — Other Ambulatory Visit: Payer: Self-pay | Admitting: Cardiology

## 2021-01-22 NOTE — Progress Notes (Signed)
[facility] Note   HF Cardiology: Dr. Shirlee Latch PCP: Dr. Pecola Leisure  60 y.o. 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  Last echo in 10/20 showed EF 55-60%, unable to visualize apical aneurysm. Last CT chest in 1/20 showed hilar, mediastinal, and pleuroparenchymal signs of sarcoidosis.  Cardiolite in 1/17 showed small, partially reversible apical defect, EF 53%, low risk.  He was not able to do a cardiac MRI due to claustrophobia.   Last seen 2/21 and sarcoidosis seemed to have been quiescent recently. He has never had definitively proven cardiac sarcoidosis but apical aneurysm in absence of coronary disease was suggestive.  He has been doing well since left THR, walks without dyspnea. No chest pain.  No problems with a flight of stairs.  No syncope, no palpitations.   Today he returns for HF follow up. Ran out of HCTZ, lisinopril and carvedilol on Sunday. Has lost some weight with dietary changes. Uses a cane when walking. Overall feeling fine. Denies increasing SOB, CP, dizziness, edema, or PND/Orthopnea. Appetite ok. No fever or chills. Does not check weights at home. Drinks 1 pint of liquor every 2 weeks.  Labs (4/15): K 4.1, creatinine 1.2, LDL 168 Labs (10/15): ACE level 4 (low), K 4.7, creatinine 1.5, pro-BNP 12 Labs (2/17): LDL 115, HDL 43 Labs (4/20): LDL 128, K 4.4, creatinine 1.2 Labs (5/21): LDL 150  ECG: SR 77 bpm (personally reviewed)  PMH:  1. Sarcoidosis: Pulmonary and cutaneous.  CT chest in 4/13 showed hilar and mediastinal lymphadenopathy but no significant parenchymal involvement.  PFTs (4/13) showed mild restriction.  Patient follows with Dr. Shelle Iron. Suspect cardiac involvement with non-CAD-related LV aneurysm.  PFTs (11/15): FVC 58%, FEV1 59%, ration 101%, TLC 57%, DLCO 53% (moderate-severe obstruction, severe parenchymal restriction).  CT chest (12/15) with bilateral hilar and mediastinal  lymphadenopathy, pleural-based nodules.  - CT chest (1/20): mediastinal, hilar, and pleuroparenchymal signs of sarcoidosis, bibasilar bronchiectasis, emphysema.  2. HTN 3. OA left hip s/p THR in 2017.   4. H/o CVA 5. Contrast allergy 6. Cardiomyopathy: Admitted in 4/13 with CP.  Myoview showed EF 47% with anterior ischemia. Echo showed EF 65%, apex poorly visualized.  LHC (4/13) showed no significant coronary disease but there was a small LV apical aneurysm. LV aneurysms have been seen with cardiac sarcoidosis, and I suspect this is the cause.  We planned an MRI to evaluate but patient was too claustrophobic and could not do the study.  Lexiscan Cardiolite (7/14) with EF 47%, no ischemia or infarction.  Echo (7/14) with EF 55-60%, mild MR, PA systolic pressure 33 mmHg, normal RV size and systolic function, LV apical aneurysm was not visualized on this echo.  Echo (10/15) with EF 55-60%, mild LVH, LV apical aneurysm was not visualized.  - Echo (1/17) with EF 55%, unable to visualize apical aneurysm. - Cardiolite (1/17) with small, partially reversible apical defect, EF 53%, low risk.  - Echo (10/20): EF 55-60%, mild LVH, normal RV, normal IVC.  7. PVCs: Holter in 9/20 showed 7.5% PVCs.   FH: Sister with multiple sclerosis.   SH: Married, nonsmoker, unemployed (used to work in [employer]).  H/o cocaine abuse.   ROS: All systems reviewed and negative except as per HPI.   Current Outpatient Medications  Medication Sig Dispense Refill   rosuvastatin (CRESTOR) 40 MG tablet Take 1 tablet (40 mg total) by mouth daily. 90 tablet 3   warfarin (COUMADIN) 10  MG tablet Take 1 tablet daily except 1.5 tablets on Tuesday and Saturdays or as directed by Anticoagulation Clinic. 80 tablet 0   No current facility-administered medications for this encounter.   Wt Readings from Last 3 Encounters:  01/23/21 (!) 138.6 kg (305 lb 9.6 oz)  01/20/20 (!) 144.1 kg (317 lb 9.6 oz)  02/12/19 (!) 145.7 kg (321 lb  1.9 oz)    BP (!) 158/101    Pulse 77    Wt (!) 138.6 kg (305 lb 9.6 oz)    SpO2 98%    BMI 37.20 kg/m  General:  NAD. No resp difficulty HEENT: Normal Neck: Supple. No JVD. Carotids 2+ bilat; no bruits. No lymphadenopathy or thryomegaly appreciated. Cor: PMI nondisplaced. Regular rate & rhythm. No rubs, gallops or murmurs. Lungs: Clear Abdomen: Obese, soft, nontender, nondistended. No hepatosplenomegaly. No bruits or masses. Good bowel sounds. Extremities: No cyanosis, clubbing, rash, trace ankle edema R>L Neuro: alert & oriented x 3, cranial nerves grossly intact. Moves all 4 extremities w/o difficulty. Affect pleasant.  Assessment/Plan: 1. Cardiomyopathy: Patient has been noted in the past to have a small LV apical aneurysm.  He was unable to do a cardiac MRI due to claustrophobia.  He had no coronary disease on angiography in the past.  I suspect that the aneurysm was caused by cardiac sarcoidosis.  LV aneurysm formation has been described with cardiac sarcoidosis.  He had a CVA in the past so is on chronic coumadin.  Most recent echo in 10/20 did not visualize the LV apical aneurysm. Stable NYHA II symptoms, volume looks good today on exam. - Continue Coumadin. - Sarcoidosis seems quiescent.  If cardiac symptoms were to worsen in the future, would get cardiac PET to make definitive diagnosis of cardiac sarcoidosis.  2. HTN: elevated today in setting of being out of medications for 3 days. Restart lisinopril, hctz and carvedilol. - Have wife check BP 1-2x/week, goal SBP 120-130s 3. Chest pain: Atypical chest pain in the past.  He had coronary angiography in 2013 without significant CAD.  Last Cardiolite in 1/17 was low risk. No CP today. 4. Hyperlipidemia: He is now on Crestor.  Check lipids today.  5. Sarcoidosis: There was evidence for sarcoidosis on 12/15 and again on CT in 1/20.  Has not followed up with Dr. Delton Coombes recently. 6. 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  7.5% PVCs  on 9/20 holter.  - Asymptomatic. Continue carvedilol. 7. ETOH use: counseled on cutting down on ETOH use. 8. Obesity: Body mass index is 36.71 kg/m. - Continue current weight loss efforts. Down about 16 lbs so far.  Followup in 6 months with Dr. Shirlee Latch.   Anderson Malta [facility] FNP-BC 01/23/2021

## 2021-01-23 ENCOUNTER — Ambulatory Visit (HOSPITAL_COMMUNITY)
Admission: RE | Admit: 2021-01-23 | Discharge: 2021-01-23 | Disposition: A | Discharge status: Home / Self Care | Payer: Medicaid Other | Source: Ambulatory Visit | Attending: Family Medicine | Admitting: Family Medicine

## 2021-01-23 ENCOUNTER — Encounter (HOSPITAL_COMMUNITY): Payer: Self-pay

## 2021-01-23 ENCOUNTER — Other Ambulatory Visit: Payer: Self-pay

## 2021-01-23 VITALS — BP 158/101 | HR 77 | Ht 76.5 in | Wt 305.6 lb

## 2021-01-23 DIAGNOSIS — E7849 Other hyperlipidemia: Secondary | ICD-10-CM

## 2021-01-23 DIAGNOSIS — F40240 Claustrophobia: Secondary | ICD-10-CM | POA: Insufficient documentation

## 2021-01-23 DIAGNOSIS — I253 Aneurysm of heart: Secondary | ICD-10-CM | POA: Insufficient documentation

## 2021-01-23 DIAGNOSIS — Z6836 Body mass index (BMI) 36.0-36.9, adult: Secondary | ICD-10-CM | POA: Insufficient documentation

## 2021-01-23 DIAGNOSIS — Z8673 Personal history of transient ischemic attack (TIA), and cerebral infarction without residual deficits: Secondary | ICD-10-CM | POA: Diagnosis not present

## 2021-01-23 DIAGNOSIS — D869 Sarcoidosis, unspecified: Secondary | ICD-10-CM

## 2021-01-23 DIAGNOSIS — I493 Ventricular premature depolarization: Secondary | ICD-10-CM

## 2021-01-23 DIAGNOSIS — Z7901 Long term (current) use of anticoagulants: Secondary | ICD-10-CM | POA: Diagnosis not present

## 2021-01-23 DIAGNOSIS — D8685 Sarcoid myocarditis: Secondary | ICD-10-CM | POA: Diagnosis not present

## 2021-01-23 DIAGNOSIS — E785 Hyperlipidemia, unspecified: Secondary | ICD-10-CM | POA: Insufficient documentation

## 2021-01-23 DIAGNOSIS — Z87898 Personal history of other specified conditions: Secondary | ICD-10-CM

## 2021-01-23 DIAGNOSIS — I429 Cardiomyopathy, unspecified: Secondary | ICD-10-CM | POA: Diagnosis present

## 2021-01-23 DIAGNOSIS — E669 Obesity, unspecified: Secondary | ICD-10-CM | POA: Diagnosis not present

## 2021-01-23 DIAGNOSIS — Z79899 Other long term (current) drug therapy: Secondary | ICD-10-CM | POA: Diagnosis not present

## 2021-01-23 DIAGNOSIS — F1020 Alcohol dependence, uncomplicated: Secondary | ICD-10-CM

## 2021-01-23 DIAGNOSIS — I10 Essential (primary) hypertension: Secondary | ICD-10-CM | POA: Diagnosis not present

## 2021-01-23 LAB — LIPID PANEL
Cholesterol: 202 mg/dL — ABNORMAL HIGH (ref 0–200)
HDL: 44 mg/dL (ref >40)
LDL Cholesterol: 136 mg/dL — ABNORMAL HIGH (ref 0–99)
Total CHOL/HDL Ratio: 4.6 RATIO
Triglycerides: 110 mg/dL (ref <150)
VLDL: 22 mg/dL (ref 0–40)

## 2021-01-23 LAB — COMPREHENSIVE METABOLIC PANEL
ALT: 27 U/L (ref 0–44)
AST: 25 U/L (ref 15–41)
Albumin: 2.8 g/dL — ABNORMAL LOW (ref 3.5–5.0)
Alkaline Phosphatase: 71 U/L (ref 38–126)
Anion gap: 8 (ref 5–15)
BUN: 14 mg/dL (ref 6–20)
CO2: 23 mmol/L (ref 22–32)
Calcium: 9 mg/dL (ref 8.9–10.3)
Chloride: 107 mmol/L (ref 98–111)
Creatinine, Ser: 0.96 mg/dL (ref 0.61–1.24)
GFR, Estimated: >60 mL/min (ref >60)
Glucose, Bld: 107 mg/dL — ABNORMAL HIGH (ref 70–99)
Potassium: 4 mmol/L (ref 3.5–5.1)
Sodium: 138 mmol/L (ref 135–145)
Total Bilirubin: 0.5 mg/dL (ref 0.3–1.2)
Total Protein: 6.4 g/dL — ABNORMAL LOW (ref 6.5–8.1)

## 2021-01-23 MED ORDER — HYDROCHLOROTHIAZIDE 25 MG PO TABS: 25.0000 mg | ORAL_TABLET | Freq: Every day | ORAL | 3 refills | Status: DC

## 2021-01-23 MED ORDER — CARVEDILOL 12.5 MG PO TABS: 12.5000 mg | ORAL_TABLET | Freq: Two times a day (BID) | ORAL | 3 refills | Status: DC

## 2021-01-23 MED ORDER — LISINOPRIL 30 MG PO TABS: 30.0000 mg | ORAL_TABLET | Freq: Every day | ORAL | 3 refills | Status: DC

## 2021-01-23 MED ORDER — ROSUVASTATIN CALCIUM 40 MG PO TABS: 40.0000 mg | ORAL_TABLET | Freq: Every day | ORAL | 3 refills | Status: DC

## 2021-01-23 NOTE — Patient Instructions (Signed)
It was great to see you today! No medication changes are needed at this time.  Labs today We will only contact you if something comes back abnormal or we need to make some changes. Otherwise no news is good news!  Your physician recommends that you schedule a follow-up appointment in: 5-6 months with Dr Shirlee Latch  Do the following things EVERYDAY: 1) Weigh yourself in the morning before breakfast. Write it down and keep it in a log. 2) Take your medicines as prescribed 3) Eat low salt foods-Limit salt (sodium) to 2000 mg per day.  4) Stay as active as you can everyday 5) Limit all fluids for the day to less than 2 liters  At the [facility], you and your health needs are our priority. As part of our continuing mission to provide you with exceptional heart care, we have created designated Provider Care Teams. These Care Teams include your primary Cardiologist (physician) and Advanced Practice Providers (APPs- Physician Assistants and Nurse Practitioners) who all work together to provide you with the care you need, when you need it.   You may see any of the following providers on your designated Care Team at your next follow up: Marland Kitchen Dr Arvilla Meres . Dr [name] . Tonye Becket, NP . Robbie Lis, PA . Shanda Bumps [place],NP . Karle Plumber, PharmD   Please be sure to bring in all your medications bottles to every appointment.   If you have any questions or concerns before your next appointment please send [place] a message through [place] or call our office at [phone].    TO LEAVE A MESSAGE FOR THE NURSE SELECT OPTION 2, PLEASE LEAVE A MESSAGE INCLUDING: . YOUR NAME . DATE OF BIRTH . CALL BACK NUMBER . REASON FOR CALL**this is important as we prioritize the call backs  YOU WILL RECEIVE A CALL BACK THE SAME DAY AS LONG AS YOU CALL BEFORE 4:00 PM

## 2021-02-14 ENCOUNTER — Telehealth: Payer: Self-pay | Admitting: *Deleted

## 2021-02-14 NOTE — Telephone Encounter (Signed)
Called pt since he is overdue for an Anticoagulation Appt; pt was last seen on 1/3/222 and was due to follow up on 01/15/21; last INR was subtherapeutic at 1.3 and when subtherapeutic the pt should be monitored within a week if possible. The pt did not answer and the voicemail did not come on so unable to leave a message. Will try back at a later date/time.   Called the pt on his new number/cellphone and he states he is out of town in [place], [place] and he did not know he had an appt so he will have to reschedule in a month once he returns. Advised that his appt was made at his last appt on 12/11/20. He states that he has enough meds as he picked up a refill in [place]. Advised that he needs to adhere to appts and reschedule as soon as he can.

## 2021-03-08 ENCOUNTER — Ambulatory Visit (INDEPENDENT_AMBULATORY_CARE_PROVIDER_SITE_OTHER): Payer: Medicaid Other | Admitting: *Deleted

## 2021-03-08 ENCOUNTER — Other Ambulatory Visit: Payer: Self-pay

## 2021-03-08 ENCOUNTER — Encounter (INDEPENDENT_AMBULATORY_CARE_PROVIDER_SITE_OTHER): Payer: Self-pay

## 2021-03-08 DIAGNOSIS — Z7902 Long term (current) use of antithrombotics/antiplatelets: Secondary | ICD-10-CM

## 2021-03-08 DIAGNOSIS — I253 Aneurysm of heart: Secondary | ICD-10-CM

## 2021-03-08 LAB — POCT INR: INR: 4 — AB (ref 2.0–3.0)

## 2021-03-08 MED ORDER — WARFARIN SODIUM 10 MG PO TABS: ORAL_TABLET | ORAL | 0 refills | Status: DC

## 2021-03-08 NOTE — Patient Instructions (Signed)
Description   Hold warfarin today, then continue to take 1 tablet daily except for 1.5 tablets on Tuesday and Saturdays. Recheck INR in 2 weeks. Call coumadin Clinic for any changes in medications or upcoming procedures. [phone].

## 2021-04-24 ENCOUNTER — Other Ambulatory Visit: Payer: Self-pay

## 2021-04-24 ENCOUNTER — Ambulatory Visit (INDEPENDENT_AMBULATORY_CARE_PROVIDER_SITE_OTHER): Payer: Medicaid Other | Admitting: *Deleted

## 2021-04-24 ENCOUNTER — Other Ambulatory Visit: Payer: Self-pay | Admitting: Cardiology

## 2021-04-24 DIAGNOSIS — Z7902 Long term (current) use of antithrombotics/antiplatelets: Secondary | ICD-10-CM | POA: Diagnosis not present

## 2021-04-24 DIAGNOSIS — I253 Aneurysm of heart: Secondary | ICD-10-CM | POA: Diagnosis not present

## 2021-04-24 LAB — PROTIME-INR
INR: 7.9 (ref 0.9–1.2)
Prothrombin Time: 73.3 s — ABNORMAL HIGH (ref 9.1–12.0)

## 2021-04-24 LAB — POCT INR: INR: 7.8 — AB (ref 2.0–3.0)

## 2021-04-24 MED ORDER — WARFARIN SODIUM 10 MG PO TABS: ORAL_TABLET | ORAL | 0 refills | Status: DC

## 2021-04-24 NOTE — Patient Instructions (Addendum)
Description   Do not take any Warfarin until we give you updated instructions. Report to ER with any bleeding, falls, or accidents. At 1210pm, spoke with pt and advised to hold warfarin today, hold tomorrow, hold Warfarin Thursday then resume the dose you should be taking which is 1 tablet daily except for 1.5 tablets on Tuesday and Saturdays. Recheck INR in 1 week. Call coumadin Clinic for any changes in medications or upcoming procedures. [phone].

## 2021-05-08 ENCOUNTER — Other Ambulatory Visit: Payer: Self-pay

## 2021-05-08 MED ORDER — WARFARIN SODIUM 10 MG PO TABS: ORAL_TABLET | ORAL | 0 refills | Status: DC

## 2021-05-17 ENCOUNTER — Telehealth: Payer: Self-pay | Admitting: *Deleted

## 2021-05-17 NOTE — Telephone Encounter (Signed)
Pt is overdue for his Anticoagulation Appt; voicemail not set up on one line so unable to leave a message.

## 2021-06-29 ENCOUNTER — Other Ambulatory Visit: Payer: Self-pay

## 2021-06-29 ENCOUNTER — Ambulatory Visit (INDEPENDENT_AMBULATORY_CARE_PROVIDER_SITE_OTHER): Payer: Medicaid Other

## 2021-06-29 DIAGNOSIS — Z7902 Long term (current) use of antithrombotics/antiplatelets: Secondary | ICD-10-CM

## 2021-06-29 DIAGNOSIS — I253 Aneurysm of heart: Secondary | ICD-10-CM

## 2021-06-29 LAB — POCT INR: INR: 4.5 — AB (ref 2.0–3.0)

## 2021-06-29 MED ORDER — WARFARIN SODIUM 10 MG PO TABS: ORAL_TABLET | ORAL | 0 refills | Status: DC

## 2021-06-29 NOTE — Patient Instructions (Signed)
Description   Skip today's dosage of Warfarin, then start taking 1 tablet daily.  Recheck INR in 2 weeks. Call coumadin Clinic for any changes in medications or upcoming procedures. [phone].

## 2021-08-02 ENCOUNTER — Telehealth: Payer: Self-pay

## 2021-08-07 ENCOUNTER — Telehealth: Payer: Self-pay

## 2021-08-17 ENCOUNTER — Telehealth: Payer: Self-pay | Admitting: *Deleted

## 2021-08-17 NOTE — Telephone Encounter (Signed)
Called pt to advise he needed to reschedule his missed appt. Spoke with pt and he states he is out of town and will call back to reschedule. Asked if he has been taking warfarin and he stated he has. Will await a call back from the pt.

## 2021-09-04 NOTE — Telephone Encounter (Signed)
Overdue - called patient  

## 2021-09-04 NOTE — Telephone Encounter (Signed)
Overdue - Called patient

## 2021-09-05 ENCOUNTER — Other Ambulatory Visit: Payer: Self-pay

## 2021-09-05 ENCOUNTER — Ambulatory Visit (INDEPENDENT_AMBULATORY_CARE_PROVIDER_SITE_OTHER): Payer: Medicaid Other

## 2021-09-05 DIAGNOSIS — Z7902 Long term (current) use of antithrombotics/antiplatelets: Secondary | ICD-10-CM

## 2021-09-05 DIAGNOSIS — I253 Aneurysm of heart: Secondary | ICD-10-CM | POA: Diagnosis not present

## 2021-09-05 LAB — POCT INR: INR: 4.9 — AB (ref 2.0–3.0)

## 2021-09-05 MED ORDER — WARFARIN SODIUM 10 MG PO TABS: ORAL_TABLET | ORAL | 0 refills | Status: DC

## 2021-09-05 NOTE — Patient Instructions (Addendum)
Description   Hold today's dose and tomorrow's dose and then continue taking 1 tablet daily.  Recheck INR in 3 weeks (Explained the need to come back in 1 week to recheck INR, pt stated he will be out of town until 09/26/21, understands risk). Call coumadin Clinic for any changes in medications or upcoming procedures. [phone].

## 2021-09-05 NOTE — Telephone Encounter (Signed)
Prescription refill request received for warfarin Lov: 01/20/20  Next INR check: 09/27/21 Warfarin tablet strength: 10mg   Pt overdue for office visit. Pt stated today at Anti-coag visit he was out of Warfarin and needed refill. Appropriate dose and 1 month supply refill sent to requested pharmacy, patient made aware.

## 2021-09-27 ENCOUNTER — Telehealth: Payer: Self-pay

## 2021-09-27 NOTE — Telephone Encounter (Signed)
Pt INR overdue. Attempted to call and re-schedule appt.

## 2021-11-15 ENCOUNTER — Other Ambulatory Visit (HOSPITAL_COMMUNITY): Payer: Self-pay | Admitting: *Deleted

## 2021-11-15 ENCOUNTER — Other Ambulatory Visit: Payer: Self-pay | Admitting: Cardiology

## 2021-11-15 MED ORDER — CARVEDILOL 12.5 MG PO TABS: 12.5000 mg | ORAL_TABLET | Freq: Two times a day (BID) | ORAL | 0 refills | Status: DC

## 2021-11-15 MED ORDER — LISINOPRIL 30 MG PO TABS: 30.0000 mg | ORAL_TABLET | Freq: Every day | ORAL | 3 refills | Status: DC

## 2021-12-14 ENCOUNTER — Ambulatory Visit: Payer: Medicaid Other | Admitting: *Deleted

## 2021-12-14 ENCOUNTER — Other Ambulatory Visit: Payer: Self-pay

## 2021-12-14 DIAGNOSIS — Z5181 Encounter for therapeutic drug level monitoring: Secondary | ICD-10-CM | POA: Diagnosis not present

## 2021-12-14 DIAGNOSIS — I253 Aneurysm of heart: Secondary | ICD-10-CM

## 2021-12-14 DIAGNOSIS — Z7902 Long term (current) use of antithrombotics/antiplatelets: Secondary | ICD-10-CM | POA: Diagnosis not present

## 2021-12-14 LAB — POCT INR: INR: 1.9 — AB (ref 2.0–3.0)

## 2021-12-14 MED ORDER — WARFARIN SODIUM 10 MG PO TABS: ORAL_TABLET | ORAL | 0 refills | Status: DC

## 2021-12-14 NOTE — Patient Instructions (Signed)
Description    Take 1.5 tablets today of warfarin then continue to take warfarin 1 tablet daily. Recheck INR in 2 weeks. Coumadin Clinic [phone].

## 2022-01-18 ENCOUNTER — Encounter (HOSPITAL_COMMUNITY): Payer: Medicaid Other | Admitting: Cardiology

## 2022-01-22 ENCOUNTER — Ambulatory Visit: Payer: Medicaid Other

## 2022-01-22 ENCOUNTER — Other Ambulatory Visit: Payer: Self-pay

## 2022-01-22 DIAGNOSIS — Z5181 Encounter for therapeutic drug level monitoring: Secondary | ICD-10-CM | POA: Diagnosis not present

## 2022-01-22 DIAGNOSIS — I253 Aneurysm of heart: Secondary | ICD-10-CM

## 2022-01-22 DIAGNOSIS — Z7902 Long term (current) use of antithrombotics/antiplatelets: Secondary | ICD-10-CM | POA: Diagnosis not present

## 2022-01-22 LAB — POCT INR: INR: 2.6 (ref 2.0–3.0)

## 2022-01-22 MED ORDER — WARFARIN SODIUM 10 MG PO TABS: ORAL_TABLET | ORAL | 0 refills | Status: DC

## 2022-01-22 NOTE — Patient Instructions (Signed)
-   continue to take warfarin 1 tablet daily.  - Recheck INR in 3 weeks. Coumadin Clinic [phone].

## 2022-03-05 ENCOUNTER — Other Ambulatory Visit (HOSPITAL_COMMUNITY): Payer: Self-pay | Admitting: Cardiology

## 2022-03-05 ENCOUNTER — Ambulatory Visit (HOSPITAL_COMMUNITY)
Admission: RE | Admit: 2022-03-05 | Discharge: 2022-03-05 | Disposition: A | Discharge status: Home / Self Care | Payer: Medicaid Other | Source: Ambulatory Visit | Attending: Cardiology | Admitting: Cardiology

## 2022-03-05 ENCOUNTER — Encounter (HOSPITAL_COMMUNITY): Payer: Self-pay | Admitting: Cardiology

## 2022-03-05 ENCOUNTER — Other Ambulatory Visit: Payer: Self-pay

## 2022-03-05 VITALS — BP 124/80 | HR 68 | Wt 292.6 lb

## 2022-03-05 DIAGNOSIS — I10 Essential (primary) hypertension: Secondary | ICD-10-CM | POA: Insufficient documentation

## 2022-03-05 DIAGNOSIS — D8685 Sarcoid myocarditis: Secondary | ICD-10-CM | POA: Diagnosis present

## 2022-03-05 DIAGNOSIS — Z79899 Other long term (current) drug therapy: Secondary | ICD-10-CM | POA: Diagnosis not present

## 2022-03-05 DIAGNOSIS — Z7901 Long term (current) use of anticoagulants: Secondary | ICD-10-CM | POA: Insufficient documentation

## 2022-03-05 DIAGNOSIS — Z8673 Personal history of transient ischemic attack (TIA), and cerebral infarction without residual deficits: Secondary | ICD-10-CM | POA: Diagnosis not present

## 2022-03-05 DIAGNOSIS — F40240 Claustrophobia: Secondary | ICD-10-CM | POA: Diagnosis not present

## 2022-03-05 DIAGNOSIS — D869 Sarcoidosis, unspecified: Secondary | ICD-10-CM | POA: Diagnosis not present

## 2022-03-05 DIAGNOSIS — I429 Cardiomyopathy, unspecified: Secondary | ICD-10-CM | POA: Diagnosis not present

## 2022-03-05 DIAGNOSIS — E785 Hyperlipidemia, unspecified: Secondary | ICD-10-CM

## 2022-03-05 DIAGNOSIS — I4891 Unspecified atrial fibrillation: Secondary | ICD-10-CM

## 2022-03-05 DIAGNOSIS — I493 Ventricular premature depolarization: Secondary | ICD-10-CM | POA: Diagnosis not present

## 2022-03-05 DIAGNOSIS — I253 Aneurysm of heart: Secondary | ICD-10-CM | POA: Insufficient documentation

## 2022-03-05 LAB — LIPID PANEL
Cholesterol: 175 mg/dL (ref 0–200)
HDL: 48 mg/dL (ref >40)
LDL Cholesterol: 108 mg/dL — ABNORMAL HIGH (ref 0–99)
Total CHOL/HDL Ratio: 3.6 RATIO
Triglycerides: 95 mg/dL (ref <150)
VLDL: 19 mg/dL (ref 0–40)

## 2022-03-05 LAB — BASIC METABOLIC PANEL
Anion gap: 7 (ref 5–15)
BUN: 15 mg/dL (ref 6–20)
CO2: 24 mmol/L (ref 22–32)
Calcium: 9.1 mg/dL (ref 8.9–10.3)
Chloride: 107 mmol/L (ref 98–111)
Creatinine, Ser: 1.05 mg/dL (ref 0.61–1.24)
GFR, Estimated: >60 mL/min (ref >60)
Glucose, Bld: 105 mg/dL — ABNORMAL HIGH (ref 70–99)
Potassium: 4.1 mmol/L (ref 3.5–5.1)
Sodium: 138 mmol/L (ref 135–145)

## 2022-03-05 NOTE — Progress Notes (Signed)
Patient ID: Tony Howard, male   DOB: 1961-08-14, 61 y.o.   MRN: [id number] ?PCP: Dr. Ayesha Rumpf ?Cardiology: Dr. Aundra Dubin ? ?61 y.o. 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  Last echo in 10/20 showed EF 55-60%, unable to visualize apical aneurysm. Last CT chest in 1/20 showed hilar, mediastinal, and pleuroparenchymal signs of sarcoidosis.  Cardiolite in 1/17 showed small, partially reversible apical defect, EF 53%, low risk.  He was not able to do a cardiac MRI due to claustrophobia.  ? ?Sarcoidosis seems to have been quiescent recently. He has never had definitively proven cardiac sarcoidosis but apical aneurysm in absence of coronary disease was suggestive.   ? ?Patient had a CVA in 4/22 that was treated with tPA.  He had a right frontal infarct with left arm weakness.  Echo at the time showed EF 50% with LV apical aneurysm, normal RV.  INR was subtherapeutic.   ? ?Patient returns for followup of ?cardiac sarcoidosis.  He denies having a stroke in 4/22; he is convinced that he had a "pinched nerve" in his shoulder.  Left hand remains mildly weak. He walks for exercise, no exertional dyspnea or chest pain. Occasional palpitations, no lightheadedness or syncope.   ? ?Labs (4/15): K 4.1, creatinine 1.2, LDL 168 ?Labs (10/15): ACE level 4 (low), K 4.7, creatinine 1.5, pro-BNP 12 ?Labs (2/17): LDL 115, HDL 43 ?Labs (4/20): LDL 128, K 4.4, creatinine 1.2 ?Labs (4/22): K 4.1, creatinine 1.1, LDL 99 ? ?ECG: NSR, PVC (personally reviewed) ? ?PMH:  ?1. Sarcoidosis: Pulmonary and cutaneous.  CT chest in 4/13 showed hilar and mediastinal lymphadenopathy but no significant parenchymal involvement.  PFTs (4/13) showed mild restriction.  Patient follows with Dr. Gwenette Greet. Suspect cardiac involvement with non-CAD-related LV aneurysm.  PFTs (11/15): FVC 58%, FEV1 59%, ration 101%, TLC 57%, DLCO 53% (moderate-severe obstruction, severe parenchymal restriction).  CT chest  (12/15) with bilateral hilar and mediastinal lymphadenopathy, pleural-based nodules.  ?- CT chest (1/20): mediastinal, hilar, and pleuroparenchymal signs of sarcoidosis, bibasilar bronchiectasis, emphysema.  ?2. HTN ?3. OA left hip s/p THR in 2017.   ?4. 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  Right frontal infarct with left arm weakness.  ?- Carotid dopplers (4/22): <50% right CCA stenosis.  ?5. Contrast allergy ?6. Cardiomyopathy: Admitted in 4/13 with CP.  Myoview showed EF 47% with anterior ischemia. Echo showed EF 65%, apex poorly visualized.  LHC (4/13) showed no significant coronary disease but there was a small LV apical aneurysm. LV aneurysms have been seen with cardiac sarcoidosis, and I suspect this is the cause.  We planned an MRI to evaluate but patient was too claustrophobic and could not do the study.  Lexiscan Cardiolite (7/14) with EF 47%, no ischemia or infarction.  Echo (7/14) with EF 55-60%, mild MR, PA systolic pressure 33 mmHg, normal RV size and systolic function, LV apical aneurysm was not visualized on this echo.  Echo (10/15) with EF 55-60%, mild LVH, LV apical aneurysm was not visualized.  ?- Echo (1/17) with EF 55%, unable to visualize apical aneurysm. ?- Cardiolite (1/17) with small, partially reversible apical defect, EF 53%, low risk.  ?- Echo (10/20): EF 55-60%, mild LVH, normal RV, normal IVC.  ?- Echo (4/22): EF 50% with LV apical aneurysm, normal RV.  ?7. PVCs: Holter in 9/20 showed 7.5% PVCs.  ? ?FH: Sister with multiple sclerosis.  ? ?SH: Married, nonsmoker, unemployed (used to work in [employer]).  H/o cocaine abuse.  ? ?  ROS: All systems reviewed and negative except as per HPI.  ? ?Current Outpatient Medications  ?Medication Sig Dispense Refill  ? carvedilol (COREG) 12.5 MG tablet Take 1 tablet (12.5 mg total) by mouth 2 (two) times daily with a meal. PLEASE CALL [phone] TO SCHEDULE AN OFFICE VISIT FOR FURTHER REFILLS. 180 tablet 0  ? hydrochlorothiazide (HYDRODIURIL) 25 MG tablet  Take 1 tablet (25 mg total) by mouth daily. 90 tablet 3  ? lisinopril (ZESTRIL) 30 MG tablet Take 1 tablet (30 mg total) by mouth daily. PLEASE CALL [phone] TO SCHEDULE AN OFFICE VISIT FOR FURTHER REFILLS. 90 tablet 3  ? rosuvastatin (CRESTOR) 40 MG tablet Take 1 tablet (40 mg total) by mouth daily. 90 tablet 3  ? warfarin (COUMADIN) 10 MG tablet Take 1 tablet by mouth daily as ddirectedby the coumadin clinic 35 tablet 0  ? ?No current facility-administered medications for this encounter.  ? ? ?BP 124/80   Pulse 68   Wt 132.7 kg (292 lb 9.6 oz)   SpO2 98%   BMI 35.15 kg/m?  ?General: NAD ?Neck: No JVD, no thyromegaly or thyroid nodule.  ?Lungs: Clear to auscultation bilaterally with normal respiratory effort. ?CV: Nondisplaced PMI.  Heart regular S1/S2, no S3/S4, no murmur.  No peripheral edema.  No carotid bruit.  Normal pedal pulses.  ?Abdomen: Soft, nontender, no hepatosplenomegaly, no distention.  ?Skin: Intact without lesions or rashes.  ?Neurologic: Alert and oriented x 3.  ?Psych: Normal affect. ?Extremities: No clubbing or cyanosis.  ?HEENT: Normal.  ? ?Assessment/Plan: ?1. Cardiomyopathy: Patient has been noted in the past to have a small LV apical aneurysm.  He was unable to do a cardiac MRI due to claustrophobia.  He had no coronary disease on angiography in the past.  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  LV aneurysm formation has been described with cardiac sarcoidosis.  Last echo in 4/22 showed EF 50%, LV apical aneurysm. ?- I will arrange for cardiac PET to assess for evidence for active inflammation suggesting active cardiac sarcoidosis.   ?2. Hyperlipidemia: He is now on Crestor.  Check lipids today.  ?3. Sarcoidosis: There was evidence for pulmonary sarcoidosis on CT in 12/15 and again on CT in 1/20.   ?- He needs followup with pulmonary, will arrange.  ?4. 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  7.5%  PVCs on 9/20 holter.  ?- Continue Coreg.  ?- I will arrange for repeat Zio monitor x 14 days to assess PVCs and also to look for atrial fibrillation given recent CVA.  ?5. CVA: Patient had remote CVA and CVA again in 4/22.  Suspected cause is cardioembolic from LV apical aneurysm.  He had been on warfarin but INR subtherapeutic in 4/22.   ?- Increase INR goal to 2.5-3.  ?- Zio monitor x 14 days to assess for atrial fibrillation (unlikely to be cause of CVA).  ?6. HTN: BP controlled. Continue current meds, check BMET.  ? ? ?Followup in 6 months.  ? ?Loralie Champagne ?03/05/2022 ?

## 2022-03-05 NOTE — Patient Instructions (Addendum)
There has been no changes to your medications  ? ?Labs done today, your results will be available in MyChart, we will contact you for abnormal readings. ? ?Your provider has recommended that  you wear a Zio Patch for 14 days.  This monitor will record your heart rhythm for our review.  IF you have any symptoms while wearing the monitor please press the button.  If you have any issues with the patch or you notice a red or orange light on it please call the company at [phone].  Once you remove the patch please mail it back to the company as soon as possible so we can get the results. ? ? ?Your provider has order a PET SCAN at [facility]. Once approved by your insurance company you will be called to schedule the appointment. ? ?Your physician recommends that you schedule a follow-up appointment in: 6 months (September 2023) **please call the office in July to arrange your follow up appointment** ? ?If you have any questions or concerns before your next appointment please send [place] a message through [place] or call our office at [phone].   ? ?TO LEAVE A MESSAGE FOR THE NURSE SELECT OPTION 2, PLEASE LEAVE A MESSAGE INCLUDING: ?YOUR NAME ?DATE OF BIRTH ?CALL BACK NUMBER ?REASON FOR CALL**this is important as we prioritize the call backs ? ?YOU WILL RECEIVE A CALL BACK THE SAME DAY AS LONG AS YOU CALL BEFORE 4:00 PM ? ?At the [facility], you and your health needs are our priority. As part of our continuing mission to provide you with exceptional heart care, we have created designated Provider Care Teams. These Care Teams include your primary Cardiologist (physician) and Advanced Practice Providers (APPs- Physician Assistants and Nurse Practitioners) who all work together to provide you with the care you need, when you need it.  ? ?You may see any of the following providers on your designated Care Team at your next follow up: ?Dr Glori Bickers ?Dr Loralie Champagne ?Darrick Grinder, NP ?Lyda Jester,  PA ?Jessica Milford,NP ?Marlyce Huge, PA ?Audry Riles, PharmD ? ? ?Please be sure to bring in all your medications bottles to every appointment.  ? ? ? ?

## 2022-03-07 ENCOUNTER — Telehealth (HOSPITAL_COMMUNITY): Payer: Self-pay

## 2022-03-07 NOTE — Telephone Encounter (Addendum)
Pt aware, agreeable, and verbalized understanding ? ? ?----- Message from Laurey Morale, MD sent at 03/06/2022  4:22 PM EDT ----- ?Has he been taking Crestor 40 mg daily? Would expect lower LDL.  ?

## 2022-03-21 ENCOUNTER — Ambulatory Visit (INDEPENDENT_AMBULATORY_CARE_PROVIDER_SITE_OTHER): Payer: Medicaid Other

## 2022-03-21 ENCOUNTER — Other Ambulatory Visit: Payer: Self-pay

## 2022-03-21 DIAGNOSIS — I253 Aneurysm of heart: Secondary | ICD-10-CM

## 2022-03-21 DIAGNOSIS — Z7902 Long term (current) use of antithrombotics/antiplatelets: Secondary | ICD-10-CM

## 2022-03-21 LAB — POCT INR: INR: 1 — AB (ref 2.0–3.0)

## 2022-03-21 MED ORDER — WARFARIN SODIUM 10 MG PO TABS: ORAL_TABLET | ORAL | 0 refills | Status: DC

## 2022-03-21 NOTE — Telephone Encounter (Signed)
Prescription refill request received for warfarin ?Lov: 03/05/22 Shirlee Latch)  ?Next INR check: 04/04/22 ?Warfarin tablet strength: 10mg  ? ?Appropriate dose and refill sent to requested pharmacy.  ?

## 2022-03-21 NOTE — Patient Instructions (Signed)
Description   ?- Take 2 tablets today and 1.5 tablets tomorrow and then continue to take warfarin 1 tablet daily.  ?- Recheck INR in 2 weeks. ?Coumadin Clinic [phone].  ?  ?   ?

## 2022-03-25 NOTE — Addendum Note (Signed)
Encounter addended by: Crissie Figures, RN on: 03/25/2022 9:33 AM ? Actions taken: Imaging Exam ended

## 2022-03-28 ENCOUNTER — Telehealth (HOSPITAL_COMMUNITY): Payer: Self-pay | Admitting: Cardiology

## 2022-03-28 MED ORDER — CARVEDILOL 12.5 MG PO TABS: 18.7500 mg | ORAL_TABLET | Freq: Two times a day (BID) | ORAL | 3 refills | Status: DC

## 2022-03-28 NOTE — Telephone Encounter (Signed)
Patient aware.

## 2022-03-28 NOTE — Telephone Encounter (Signed)
-----   Message from Laurey Morale, MD sent at 03/28/2022  8:47 AM EDT ----- ?No atrial fibrillation noted though he has frequent PVCs. Would increase Coreg to 18.75 mg bid with PVCs.  ?

## 2022-04-03 ENCOUNTER — Telehealth (HOSPITAL_COMMUNITY): Payer: Self-pay | Admitting: *Deleted

## 2022-04-03 NOTE — Telephone Encounter (Signed)
Pet scan auth request faxed to [employer] ?

## 2022-04-09 NOTE — Telephone Encounter (Signed)
Can we appeal please as standard of care?  ?

## 2022-04-09 NOTE — Telephone Encounter (Signed)
[facility] does not cover cardiac PET scans. Therefore we can not order them for ANY [place] medicaid plans ([facility] medicaid, [employer] medicaid,etc).  ?

## 2022-04-10 NOTE — Telephone Encounter (Signed)
No this is not a denial. They do not cover this procedure at all. Therefore there is nothing to appeal.  ?

## 2022-04-15 ENCOUNTER — Other Ambulatory Visit: Payer: Self-pay | Admitting: *Deleted

## 2022-04-15 DIAGNOSIS — I253 Aneurysm of heart: Secondary | ICD-10-CM

## 2022-04-15 MED ORDER — WARFARIN SODIUM 10 MG PO TABS: ORAL_TABLET | ORAL | 0 refills | Status: DC

## 2022-04-15 NOTE — Telephone Encounter (Signed)
Pt called and stated he dropped his pills in water and can't use them. He is pending an appt on 04/18/2022; he is aware will send in enough pills until appt. 4 warfarin tablets sent until appt ?

## 2022-04-23 ENCOUNTER — Telehealth: Payer: Self-pay | Admitting: *Deleted

## 2022-04-23 NOTE — Telephone Encounter (Signed)
Pt was transferred via scheduling department to speak with Anticoagulation Clinic. He was transferred and pt stated in a demanding tone over the phone "why did y'all send in 4 pills of my warfarin I lost 20 pills and need them replaced", advised that per the phone conversation on 04/15/2022, that he was advised that enough pills would be sent in until the appointment that he made on 04/18/2022 and that since he canceled that appt and is overdue for INR check I will need to make him an appt in the Anticoagulation Clinic and will send in more tablets until an appointment. Pt kept interrupting while I was talking and He stated "he needs all 20 pills replaced now" advised that we need to get a follow up appt and I would be able to send in warfarin pills until that appt. He stated "that he will not be making an appointment and will call back when he is ready to make an appointment". Pt was due on 04/04/2022 and last INR check was on 03/21/22 and INR was 1.0; which places him at increased risk of stroke and clots. ? ?Pt hung up before I could advise him on warfarin being a monitored medication and that we cannot send in warfarin meds without proper follow-up.   ? ? ?

## 2022-04-26 ENCOUNTER — Other Ambulatory Visit (HOSPITAL_COMMUNITY): Payer: Self-pay | Admitting: *Deleted

## 2022-08-22 ENCOUNTER — Telehealth: Payer: Self-pay | Admitting: *Deleted

## 2022-08-22 DIAGNOSIS — I513 Intracardiac thrombosis, not elsewhere classified: Secondary | ICD-10-CM

## 2022-08-22 NOTE — Telephone Encounter (Signed)
Called pt since he is overdue for INR monitoring. He was last seen on 03/21/2022 for an INR check. Spoke with pt and he stated he does not want to be scheduled for an appointment and asked not to call back. Pt did not give any information regarding where he was being monitored or if he was taking the warfarin or if it was being filled.

## 2022-08-27 NOTE — Telephone Encounter (Signed)
He is on warfarin to prevent stroke (thought to be from LV aneurysm).  If he does not want to be monitored for warfarin, could transition him to Xarelto or Eliquis.  He needs to stop warfarin if he is not willing to be monitored.

## 2022-08-29 ENCOUNTER — Other Ambulatory Visit (HOSPITAL_COMMUNITY): Payer: Self-pay

## 2022-08-29 NOTE — Telephone Encounter (Signed)
Dr. Aundra Dubin Per Etheleen Nicks, RN  The Anticoagulation/Coumadin Clinic from [facility] is located at [facility] . The clinic was combined a couple months ago which consist of Biomedical scientist. How do you want to proceed?

## 2022-08-29 NOTE — Telephone Encounter (Signed)
If he has not been to [facility], maybe he will find the new office acceptable.

## 2022-08-29 NOTE — Telephone Encounter (Signed)
The Anticoagulation Clinic from [facility] was moved to [facility] and it includes the staff from [facility]. Please advise.

## 2022-08-29 NOTE — Telephone Encounter (Signed)
Spoke with patient and he states that he doesn't mind being followed closely but he would prefer not to be followed by [address] office. Dicussed this with Dr. Aundra Dubin more in depth and he would like for patient to followed by the [facility] for his Coumadin.

## 2022-08-30 NOTE — Telephone Encounter (Signed)
I am unclear what patient wants. The [facility] office is staffed by the same nurses that formerly worked at the [facility]. Per protocol, they can not refill warfarin since patient has not had an INR check in almost 6 months and they have been calling him to ask him to please come in. If the patient does not like receiving reminder calls from our nursing staff then switching to a DOAC is an option or perhaps HF clinic or PCP can monitor?

## 2022-08-30 NOTE — Telephone Encounter (Signed)
The patient states the the reminder calls were not the problem. He states that he was out of town in [place] and called for a refill but was only told he could get 4 tablets. He was afraid that the 4 tablets would not last him until he came back in town, he states he did not like how the situation was handled.

## 2022-09-01 NOTE — Telephone Encounter (Signed)
Can we just please get him back into the coumadin clinic with a minimum of fuss? I don't care what happened, just address his concerns and get him following up as he is supposed to.  Apologize to him and let him know it won't happen again so we can get to the end of this string of messages.

## 2022-09-02 ENCOUNTER — Telehealth: Payer: Self-pay | Admitting: Home Health

## 2022-09-02 DIAGNOSIS — I513 Intracardiac thrombosis, not elsewhere classified: Secondary | ICD-10-CM | POA: Insufficient documentation

## 2022-09-02 MED ORDER — RIVAROXABAN 20 MG PO TABS: 20.0000 mg | ORAL_TABLET | Freq: Every day | ORAL | 11 refills | Status: DC

## 2022-09-02 NOTE — Telephone Encounter (Signed)
Patient called after-hours line, asking why was he switched from Coumadin to Xarelto.  Chart reviewed.  Apparently he was subtherapeutic with INR while taking Coumadin.  He also did not want ongoing blood work for monitor.  Xarelto was started for this reason.  Patient states he understood and is agreeable.  He wants to let Dr. Aundra Dubin know/will forward this message.

## 2022-09-02 NOTE — Telephone Encounter (Signed)
Will convert patient to Xarelto 20mg  once daily.  No PA required.  D/C warfarin at this time.

## 2022-09-02 NOTE — Telephone Encounter (Addendum)
Called pt as instructed and after identifying myself advised him on the reason I was calling and he stated who asked you to call me and what is this clinic? Advised pt that I was following up from a message from Dr. Aundra Dubin and that this is the Anticoagulation/Coumadin Clinic. He stated well I told y'all I ain't coming over there and then he hung up.  Pt did NOT make an appointment or receive any information as he hung up.

## 2022-09-02 NOTE — Addendum Note (Signed)
Addended by: Rollen Sox on: 09/02/2022 04:19 PM   Modules accepted: Orders

## 2022-12-18 ENCOUNTER — Other Ambulatory Visit (HOSPITAL_COMMUNITY): Payer: Self-pay | Admitting: Family Medicine

## 2022-12-18 ENCOUNTER — Other Ambulatory Visit (HOSPITAL_COMMUNITY): Payer: Self-pay

## 2022-12-18 MED ORDER — LISINOPRIL 30 MG PO TABS: ORAL_TABLET | ORAL | 0 refills | Status: DC

## 2023-01-15 ENCOUNTER — Encounter (HOSPITAL_COMMUNITY): Payer: Self-pay | Admitting: Cardiology

## 2023-01-15 ENCOUNTER — Ambulatory Visit (HOSPITAL_COMMUNITY)
Admission: RE | Admit: 2023-01-15 | Discharge: 2023-01-15 | Disposition: A | Discharge status: Home / Self Care | Payer: Medicaid Other | Source: Ambulatory Visit | Attending: Cardiology | Admitting: Cardiology

## 2023-01-15 VITALS — BP 150/88 | HR 60 | Wt 295.2 lb

## 2023-01-15 DIAGNOSIS — Z7901 Long term (current) use of anticoagulants: Secondary | ICD-10-CM | POA: Insufficient documentation

## 2023-01-15 DIAGNOSIS — R079 Chest pain, unspecified: Secondary | ICD-10-CM

## 2023-01-15 DIAGNOSIS — D869 Sarcoidosis, unspecified: Secondary | ICD-10-CM

## 2023-01-15 DIAGNOSIS — I493 Ventricular premature depolarization: Secondary | ICD-10-CM | POA: Diagnosis not present

## 2023-01-15 DIAGNOSIS — Z8673 Personal history of transient ischemic attack (TIA), and cerebral infarction without residual deficits: Secondary | ICD-10-CM | POA: Diagnosis not present

## 2023-01-15 DIAGNOSIS — I10 Essential (primary) hypertension: Secondary | ICD-10-CM | POA: Insufficient documentation

## 2023-01-15 DIAGNOSIS — I429 Cardiomyopathy, unspecified: Secondary | ICD-10-CM | POA: Diagnosis not present

## 2023-01-15 DIAGNOSIS — Z91041 Radiographic dye allergy status: Secondary | ICD-10-CM | POA: Diagnosis not present

## 2023-01-15 DIAGNOSIS — I253 Aneurysm of heart: Secondary | ICD-10-CM

## 2023-01-15 DIAGNOSIS — Z96642 Presence of left artificial hip joint: Secondary | ICD-10-CM | POA: Diagnosis not present

## 2023-01-15 LAB — BASIC METABOLIC PANEL
Anion gap: 8 (ref 5–15)
BUN: 12 mg/dL (ref 8–23)
CO2: 25 mmol/L (ref 22–32)
Calcium: 9 mg/dL (ref 8.9–10.3)
Chloride: 104 mmol/L (ref 98–111)
Creatinine, Ser: 1.06 mg/dL (ref 0.61–1.24)
GFR, Estimated: >60 mL/min (ref >60)
Glucose, Bld: 91 mg/dL (ref 70–99)
Potassium: 3.9 mmol/L (ref 3.5–5.1)
Sodium: 137 mmol/L (ref 135–145)

## 2023-01-15 LAB — BRAIN NATRIURETIC PEPTIDE: B Natriuretic Peptide: 39 pg/mL (ref 0.0–100.0)

## 2023-01-15 LAB — LIPID PANEL
Cholesterol: 201 mg/dL — ABNORMAL HIGH (ref 0–200)
HDL: 45 mg/dL (ref >40)
LDL Cholesterol: 137 mg/dL — ABNORMAL HIGH (ref 0–99)
Total CHOL/HDL Ratio: 4.5 RATIO
Triglycerides: 94 mg/dL (ref <150)
VLDL: 19 mg/dL (ref 0–40)

## 2023-01-15 LAB — CBC
HCT: 44.6 % (ref 39.0–52.0)
Hemoglobin: 15.3 g/dL (ref 13.0–17.0)
MCH: 31.2 pg (ref 26.0–34.0)
MCHC: 34.3 g/dL (ref 30.0–36.0)
MCV: 90.8 fL (ref 80.0–100.0)
Platelets: 149 10*3/uL — ABNORMAL LOW (ref 150–400)
RBC: 4.91 MIL/uL (ref 4.22–5.81)
RDW: 12.2 % (ref 11.5–15.5)
WBC: 5.1 10*3/uL (ref 4.0–10.5)
nRBC: 0 % (ref 0.0–0.2)

## 2023-01-15 MED ORDER — CARVEDILOL 25 MG PO TABS: 25.0000 mg | ORAL_TABLET | Freq: Two times a day (BID) | ORAL | 5 refills | Status: DC

## 2023-01-15 NOTE — Patient Instructions (Signed)
INCREASE Coreg to 25 mg Twice daily  Your physician has requested that you have an echocardiogram. Echocardiography is a painless test that uses sound waves to create images of your heart. It provides your doctor with information about the size and shape of your heart and how well your heart's chambers and valves are working. This procedure takes approximately one hour. There are no restrictions for this procedure. Please do NOT wear cologne, perfume, aftershave, or lotions (deodorant is allowed). Please arrive 15 minutes prior to your appointment time.  A chest x-ray takes a picture of the organs and structures inside the chest, including the heart, lungs, and blood vessels. This test can show several things, including, whether the heart is enlarges; whether fluid is building up in the lungs; and whether pacemaker / defibrillator leads are still in place.  Cardiac PET Scan  once insurance approves we will call to schedule How to Prepare for Your Cardiac PET/CT Stress Test:  1. Please do not take these medications before your test:   Medications that may interfere with the cardiac pharmacological stress agent (ex. nitrates - including erectile dysfunction medications, isosorbide mononitrate, tamulosin or beta-blockers) the day of the exam. (Erectile dysfunction medication should be held for at least 72 hrs prior to test) Theophylline containing medications for 12 hours. Dipyridamole 48 hours prior to the test. Your remaining medications may be taken with water.  2. Nothing to eat or drink, except water, 3 hours prior to arrival time.   NO caffeine/decaffeinated products, or chocolate 12 hours prior to arrival.  3. NO perfume, cologne or lotion  4. Total time is 1 to 2 hours; you may want to bring reading material for the waiting time.  5. Please report to Radiology at the [facility] Main Entrance 30 minutes early for your test.  Tony Howard, [place]  [id number]  Diabetic Preparation:  Hold oral medications. You may take NPH and Lantus insulin. Do not take Humalog or Humulin R (Regular Insulin) the day of your test. Check blood sugars prior to leaving the house. If able to eat breakfast prior to 3 hour fasting, you may take all medications, including your insulin, Do not worry if you miss your breakfast dose of insulin - start at your next meal.  IF YOU THINK YOU MAY BE PREGNANT, OR ARE NURSING PLEASE INFORM THE TECHNOLOGIST.  In preparation for your appointment, medication and supplies will be purchased.  Appointment availability is limited, so if you need to cancel or reschedule, please call the Radiology Department at [phone]  24 hours in advance to avoid a cancellation fee of $100.00  What to Expect After you Arrive:  Once you arrive and check in for your appointment, you will be taken to a preparation room within the Radiology Department.  A technologist or Nurse will obtain your medical history, verify that you are correctly prepped for the exam, and explain the procedure.  Afterwards,  an IV will be started in your arm and electrodes will be placed on your skin for EKG monitoring during the stress portion of the exam. Then you will be escorted to the PET/CT scanner.  There, staff will get you positioned on the scanner and obtain a blood pressure and EKG.  During the exam, you will continue to be connected to the EKG and blood pressure machines.  A small, safe amount of a radioactive tracer will be injected in your IV to obtain a series of pictures of your heart  along with an injection of a stress agent.    After your Exam:  It is recommended that you eat a meal and drink a caffeinated beverage to counter act any effects of the stress agent.  Drink plenty of fluids for the remainder of the day and urinate frequently for the first couple of hours after the exam.  Your doctor will inform you of your test results within 7-10 business  days.  For questions about your test or how to prepare for your test, please call: Marchia Bond, Cardiac Imaging Nurse Navigator  Gordy Clement, Cardiac Imaging Nurse Navigator Office: [phone]   Labs done today, your results will be available in [place], we will contact you for abnormal readings.  You have been referred to pulmonary they will call to schedule an appointment Your physician recommends that you schedule a follow-up appointment in: 3 months  If you have any questions or concerns before your next appointment please send [place] a message through [place] or call our office at [phone].    TO LEAVE A MESSAGE FOR THE NURSE SELECT OPTION 2, PLEASE LEAVE A MESSAGE INCLUDING: YOUR NAME DATE OF BIRTH CALL BACK NUMBER REASON FOR CALL**this is important as we prioritize the call backs  YOU WILL RECEIVE A CALL BACK THE SAME DAY AS LONG AS YOU CALL BEFORE 4:00 PM  At the [facility], you and your health needs are our priority. As part of our continuing mission to provide you with exceptional heart care, we have created designated Provider Care Teams. These Care Teams include your primary Cardiologist (physician) and Advanced Practice Providers (APPs- Physician Assistants and Nurse Practitioners) who all work together to provide you with the care you need, when you need it.   You may see any of the following providers on your designated Care Team at your next follow up: Dr Glori Bickers Dr Loralie Champagne Dr. Roxana Hires, NP Lyda Jester, [facility] [place], [place] Forestine Na, NP Audry Riles, PharmD   Please be sure to bring in all your medications bottles to every appointment.    Thank you for choosing [facility]

## 2023-01-15 NOTE — Progress Notes (Signed)
Patient ID: Tony Howard, male   DOB: 01-Feb-1961, 62 y.o.   MRN: [id number] PCP: Dr. Ayesha Rumpf Cardiology: Dr. Aundra Dubin  62 y.o. 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  Last echo in 10/20 showed EF 55-60%, unable to visualize apical aneurysm. Last CT chest in 1/20 showed hilar, mediastinal, and pleuroparenchymal signs of sarcoidosis.  Cardiolite in 1/17 showed small, partially reversible apical defect, EF 53%, low risk.  He was not able to do a cardiac MRI due to claustrophobia.   Sarcoidosis seems to have been quiescent recently. He has never had definitively proven cardiac sarcoidosis but apical aneurysm in absence of coronary disease was suggestive.    Patient had a CVA in 4/22 that was treated with tPA.  He had a right frontal infarct with left arm weakness.  Echo at the time showed EF 50% with LV apical aneurysm, normal RV.  INR was subtherapeutic.    Zio monitor in 4/23 showed 5.9% PVCs, no atrial fibrillation.    Patient returns for followup of ?cardiac sarcoidosis.  He was not compliant with INRs on warfarin and is now on Xarelto.  He generally feels good.  Walks for exercise, denies exertional dyspnea or chest pain.  No lightheadedness or syncope. He feels palpitations at times. Weight is stable.   Labs (4/15): K 4.1, creatinine 1.2, LDL 168 Labs (10/15): ACE level 4 (low), K 4.7, creatinine 1.5, pro-BNP 12 Labs (2/17): LDL 115, HDL 43 Labs (4/20): LDL 128, K 4.4, creatinine 1.2 Labs (4/22): K 4.1, creatinine 1.1, LDL 99 Labs (3/23): K 4.1, creatinine 1.05, LDL 108  ECG: NSR, LVH, nonspecific T wave changes (personally reviewed)  PMH:  1. Sarcoidosis: Pulmonary and cutaneous.  CT chest in 4/13 showed hilar and mediastinal lymphadenopathy but no significant parenchymal involvement.  PFTs (4/13) showed mild restriction.  Patient follows with Dr. Gwenette Greet. Suspect cardiac involvement with non-CAD-related LV aneurysm.  PFTs  (11/15): FVC 58%, FEV1 59%, ration 101%, TLC 57%, DLCO 53% (moderate-severe obstruction, severe parenchymal restriction).  CT chest (12/15) with bilateral hilar and mediastinal lymphadenopathy, pleural-based nodules.  - CT chest (1/20): mediastinal, hilar, and pleuroparenchymal signs of sarcoidosis, bibasilar bronchiectasis, emphysema.  2. HTN 3. OA left hip s/p THR in 2017.   4. 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  Right frontal infarct with left arm weakness.  - Carotid dopplers (4/22): <50% right CCA stenosis.  5. Contrast allergy 6. Cardiomyopathy: Admitted in 4/13 with CP.  Myoview showed EF 47% with anterior ischemia. Echo showed EF 65%, apex poorly visualized.  LHC (4/13) showed no significant coronary disease but there was a small LV apical aneurysm. LV aneurysms have been seen with cardiac sarcoidosis, and I suspect this is the cause.  We planned an MRI to evaluate but patient was too claustrophobic and could not do the study.  Lexiscan Cardiolite (7/14) with EF 47%, no ischemia or infarction.  Echo (7/14) with EF 55-60%, mild MR, PA systolic pressure 33 mmHg, normal RV size and systolic function, LV apical aneurysm was not visualized on this echo.  Echo (10/15) with EF 55-60%, mild LVH, LV apical aneurysm was not visualized.  - Echo (1/17) with EF 55%, unable to visualize apical aneurysm. - Cardiolite (1/17) with small, partially reversible apical defect, EF 53%, low risk.  - Echo (10/20): EF 55-60%, mild LVH, normal RV, normal IVC.  - Echo (4/22): EF 50% with LV apical aneurysm, normal RV.  7. PVCs: Holter in 9/20 showed  7.5% PVCs.  - Zio monitor (7/23): 5.9% PVCs, no atrial fibrillation.   FH: Sister with multiple sclerosis.   SH: Married, nonsmoker, unemployed (used to work in [employer]).  H/o cocaine abuse.   ROS: All systems reviewed and negative except as per HPI.   Current Outpatient Medications  Medication Sig Dispense Refill   hydrochlorothiazide (HYDRODIURIL) 25 MG tablet Take 1  tablet (25 mg total) by mouth daily. 90 tablet 3   lisinopril (ZESTRIL) 30 MG tablet TAKE 1 TABLET BY MOUTH ONCE DAILY. PLEASE CALL [phone] to schedule an appointment 60 tablet 0   rivaroxaban (XARELTO) 20 MG TABS tablet Take 1 tablet (20 mg total) by mouth daily with supper. 30 tablet 11   rosuvastatin (CRESTOR) 40 MG tablet Take 1 tablet (40 mg total) by mouth daily. 90 tablet 3   carvedilol (COREG) 25 MG tablet Take 1 tablet (25 mg total) by mouth 2 (two) times daily with a meal. 60 tablet 5   No current facility-administered medications for this encounter.    BP (!) 150/88   Pulse 60   Wt 133.9 kg (295 lb 3.2 oz)   SpO2 96%   BMI 35.46 kg/m  General: NAD Neck: No JVD, no thyromegaly or thyroid nodule.  Lungs: Decreased BS right base.  CV: Nondisplaced PMI.  Heart regular S1/S2, no S3/S4, no murmur.  No peripheral edema.  No carotid bruit.  Normal pedal pulses.  Abdomen: Soft, nontender, no hepatosplenomegaly, no distention.  Skin: Plaque-like rash diffusely.  Neurologic: Alert and oriented x 3.  Psych: Normal affect. Extremities: No clubbing or cyanosis.  HEENT: Normal.   Assessment/Plan: 1. Cardiomyopathy: Patient has been noted in the past to have a small LV apical aneurysm.  He was unable to do a cardiac MRI due to claustrophobia.  He had no coronary disease on angiography in the past.  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  LV aneurysm formation has been described with cardiac sarcoidosis.  Last echo in 4/22 showed EF 50%, LV apical aneurysm. - I am going to try to arrange for cardiac PET to assess for evidence for active inflammation suggesting active cardiac sarcoidosis.  We were unable to get this in the past due to Medicaid, but will check this again.  - I will arrange for repeat echo.  2. Hyperlipidemia: He is now on Crestor.  Check lipids today.  3. Sarcoidosis: There was evidence for  pulmonary sarcoidosis on CT in 12/15 and again on CT in 1/20.   - He needs followup with pulmonary, will make referral.  - Decreased breath sounds right base, will get CXR to look for pleural effusion.  4. 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  7.5% PVCs on 9/20 holter. 5.9% PVCs on 4/23.  - Increase Coreg to 25 mg bid.  5. CVA: Patient had remote CVA and CVA again in 4/22.  Suspected cause is cardioembolic from LV apical aneurysm.  He had been on warfarin but INR subtherapeutic. Now on Xarelto.   - Continue Xarelto.  6. HTN: BP elevated, increasing Coreg.  7. Hyperlipidemia: Check lipids today.    Followup in 3 months with APP.    Loralie Champagne 01/15/2023

## 2023-01-17 ENCOUNTER — Telehealth (HOSPITAL_COMMUNITY): Payer: Self-pay

## 2023-01-17 NOTE — Telephone Encounter (Signed)
-----   Message from Larey Dresser, MD sent at 01/16/2023  8:40 PM EST ----- No pleural effusion.

## 2023-01-17 NOTE — Telephone Encounter (Signed)
Pt aware, agreeable, and verbalized understanding 

## 2023-01-22 ENCOUNTER — Telehealth: Payer: Self-pay | Admitting: *Deleted

## 2023-01-22 NOTE — Telephone Encounter (Signed)
   No pre cert reqd for echo  

## 2023-01-22 NOTE — Telephone Encounter (Signed)
   No pre cert required for cardiac pet. Message sent to [facility] pet scan pool to schedule pt.

## 2023-02-21 ENCOUNTER — Ambulatory Visit (HOSPITAL_COMMUNITY)
Admission: RE | Admit: 2023-02-21 | Discharge: 2023-02-21 | Disposition: A | Discharge status: Home / Self Care | Payer: Medicaid Other | Source: Ambulatory Visit | Attending: Cardiology | Admitting: Cardiology

## 2023-02-21 DIAGNOSIS — D869 Sarcoidosis, unspecified: Secondary | ICD-10-CM | POA: Diagnosis present

## 2023-02-21 DIAGNOSIS — I119 Hypertensive heart disease without heart failure: Secondary | ICD-10-CM | POA: Diagnosis not present

## 2023-02-21 DIAGNOSIS — I493 Ventricular premature depolarization: Secondary | ICD-10-CM | POA: Diagnosis not present

## 2023-02-21 DIAGNOSIS — D8685 Sarcoid myocarditis: Secondary | ICD-10-CM | POA: Diagnosis not present

## 2023-02-21 DIAGNOSIS — I728 Aneurysm of other specified arteries: Secondary | ICD-10-CM | POA: Diagnosis not present

## 2023-02-21 DIAGNOSIS — Z8673 Personal history of transient ischemic attack (TIA), and cerebral infarction without residual deficits: Secondary | ICD-10-CM | POA: Diagnosis not present

## 2023-02-21 LAB — ECHOCARDIOGRAM COMPLETE
AR max vel: 2.33 cm2
AV Area VTI: 2.44 cm2
AV Area mean vel: 2.25 cm2
AV Mean grad: 3 mmHg
AV Peak grad: 5.5 mmHg
Ao pk vel: 1.17 m/s
Area-P 1/2: 3.89 cm2
S' Lateral: 3.3 cm
Single Plane A4C EF: 45.5 %

## 2023-03-14 ENCOUNTER — Telehealth (HOSPITAL_COMMUNITY): Payer: Self-pay | Admitting: *Deleted

## 2023-03-14 NOTE — Telephone Encounter (Signed)
Patient calling to clarify diet instructions and procedure for sarcoid PET study.  Upon hearing about an IV for an injection required for the study, he stated that he is allergic to IV dye used in CT.  I explain to him that it was a different substance but patient remains uncomfortable to purse testing.  I sent a message to Dr. Shirlee Latch to make him aware.  Larey Brick RN Navigator Cardiac Imaging [facility] [phone] Office [phone] Cell

## 2023-03-25 ENCOUNTER — Inpatient Hospital Stay (HOSPITAL_COMMUNITY): Admission: RE | Admit: 2023-03-25 | Payer: Medicaid Other | Source: Ambulatory Visit

## 2023-04-01 ENCOUNTER — Inpatient Hospital Stay (HOSPITAL_COMMUNITY)
Admission: RE | Admit: 2023-04-01 | Discharge: 2023-04-01 | Disposition: A | Discharge status: Home / Self Care | Payer: Medicaid Other | Source: Ambulatory Visit | Attending: Cardiology | Admitting: Cardiology

## 2023-04-15 ENCOUNTER — Encounter (HOSPITAL_COMMUNITY): Payer: Medicaid Other

## 2023-05-08 ENCOUNTER — Encounter (HOSPITAL_COMMUNITY): Payer: Medicaid Other

## 2023-05-23 ENCOUNTER — Encounter (HOSPITAL_COMMUNITY): Payer: Medicaid Other

## 2023-07-15 ENCOUNTER — Telehealth (HOSPITAL_COMMUNITY): Payer: Self-pay | Admitting: Cardiology

## 2023-07-15 MED ORDER — CARVEDILOL 25 MG PO TABS: 25.0000 mg | ORAL_TABLET | Freq: Two times a day (BID) | ORAL | 0 refills | Status: DC

## 2023-07-15 MED ORDER — LISINOPRIL 30 MG PO TABS: ORAL_TABLET | ORAL | 0 refills | Status: DC

## 2023-07-15 MED ORDER — HYDROCHLOROTHIAZIDE 25 MG PO TABS: 25.0000 mg | ORAL_TABLET | Freq: Every day | ORAL | 0 refills | Status: DC

## 2023-07-15 NOTE — Telephone Encounter (Signed)
Follow up scheduled and meds refilled until follow up  Pt voiced understanding

## 2023-07-16 ENCOUNTER — Other Ambulatory Visit (HOSPITAL_COMMUNITY): Payer: Self-pay | Admitting: Family Medicine

## 2023-07-18 ENCOUNTER — Encounter (HOSPITAL_COMMUNITY): Payer: Self-pay

## 2023-08-08 ENCOUNTER — Encounter (HOSPITAL_COMMUNITY): Payer: Self-pay

## 2023-08-08 ENCOUNTER — Ambulatory Visit (HOSPITAL_COMMUNITY)
Admission: RE | Admit: 2023-08-08 | Discharge: 2023-08-08 | Disposition: A | Discharge status: Home / Self Care | Payer: Medicaid Other | Source: Ambulatory Visit | Attending: Family Medicine | Admitting: Family Medicine

## 2023-08-08 VITALS — BP 142/94 | HR 61 | Wt 289.0 lb

## 2023-08-08 DIAGNOSIS — Z8673 Personal history of transient ischemic attack (TIA), and cerebral infarction without residual deficits: Secondary | ICD-10-CM | POA: Diagnosis not present

## 2023-08-08 DIAGNOSIS — F40240 Claustrophobia: Secondary | ICD-10-CM | POA: Insufficient documentation

## 2023-08-08 DIAGNOSIS — I253 Aneurysm of heart: Secondary | ICD-10-CM | POA: Insufficient documentation

## 2023-08-08 DIAGNOSIS — Z7901 Long term (current) use of anticoagulants: Secondary | ICD-10-CM | POA: Diagnosis not present

## 2023-08-08 DIAGNOSIS — I493 Ventricular premature depolarization: Secondary | ICD-10-CM | POA: Diagnosis not present

## 2023-08-08 DIAGNOSIS — I429 Cardiomyopathy, unspecified: Secondary | ICD-10-CM | POA: Diagnosis not present

## 2023-08-08 DIAGNOSIS — D869 Sarcoidosis, unspecified: Secondary | ICD-10-CM | POA: Diagnosis not present

## 2023-08-08 DIAGNOSIS — Z79899 Other long term (current) drug therapy: Secondary | ICD-10-CM | POA: Diagnosis not present

## 2023-08-08 DIAGNOSIS — I10 Essential (primary) hypertension: Secondary | ICD-10-CM | POA: Diagnosis not present

## 2023-08-08 DIAGNOSIS — D8685 Sarcoid myocarditis: Secondary | ICD-10-CM | POA: Insufficient documentation

## 2023-08-08 DIAGNOSIS — E785 Hyperlipidemia, unspecified: Secondary | ICD-10-CM | POA: Insufficient documentation

## 2023-08-08 DIAGNOSIS — I639 Cerebral infarction, unspecified: Secondary | ICD-10-CM

## 2023-08-08 DIAGNOSIS — D860 Sarcoidosis of lung: Secondary | ICD-10-CM | POA: Diagnosis not present

## 2023-08-08 LAB — BASIC METABOLIC PANEL
Anion gap: 8 (ref 5–15)
BUN: 19 mg/dL (ref 8–23)
CO2: 24 mmol/L (ref 22–32)
Calcium: 9.1 mg/dL (ref 8.9–10.3)
Chloride: 104 mmol/L (ref 98–111)
Creatinine, Ser: 1.14 mg/dL (ref 0.61–1.24)
GFR, Estimated: >60 mL/min (ref >60)
Glucose, Bld: 103 mg/dL — ABNORMAL HIGH (ref 70–99)
Potassium: 4.3 mmol/L (ref 3.5–5.1)
Sodium: 136 mmol/L (ref 135–145)

## 2023-08-08 LAB — BRAIN NATRIURETIC PEPTIDE: B Natriuretic Peptide: 16.2 pg/mL (ref 0.0–100.0)

## 2023-08-08 NOTE — Progress Notes (Signed)
Patient ID: Tony Howard, male   DOB: Aug 06, 1961, 62 y.o.   MRN: [id number] PCP: Dr. Pecola Leisure Cardiology: Dr. Shirlee Latch  62 y.o. 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  Last echo in 10/20 showed EF 55-60%, unable to visualize apical aneurysm. Last CT chest in 1/20 showed hilar, mediastinal, and pleuroparenchymal signs of sarcoidosis.  Cardiolite in 1/17 showed small, partially reversible apical defect, EF 53%, low risk.  He was not able to do a cardiac MRI due to claustrophobia.   Sarcoidosis seems to have been quiescent recently. He has never had definitively proven cardiac sarcoidosis but apical aneurysm in absence of coronary disease was suggestive.    Patient had a CVA in 4/22 that was treated with tPA.  He had a right frontal infarct with left arm weakness.  Echo at the time showed EF 50% with LV apical aneurysm, normal RV.  INR was subtherapeutic.    Zio monitor in 4/23 showed 5.9% PVCs, no atrial fibrillation.    Echo 3/24 showed EF 50-55%, moderate LVH, normal RV  Today he returns for HF follow up. Overall feeling fine. He is not SOB with walking up steps or doing sit ups. Denies palpitations, abnormal bleeding, CP, dizziness, edema, or PND/Orthopnea. Appetite ok. No fever or chills. He does not weigh at home. Taking all medications. Wife checks BP at home, but he does not recall readings. He does not snore. Not interested in making medication changes or cardiac PET.  ECG (personally reviewed):  NSR, LVH w/ PVCs  Labs (4/15): K 4.1, creatinine 1.2, LDL 168 Labs (10/15): ACE level 4 (low), K 4.7, creatinine 1.5, pro-BNP 12 Labs (2/17): LDL 115, HDL 43 Labs (4/20): LDL 128, K 4.4, creatinine 1.2 Labs (4/22): K 4.1, creatinine 1.1, LDL 99 Labs (3/23): K 4.1, creatinine 1.05, LDL 108 Labs (2/24): K 3.9, creatinine 1.06, LDL 137  PMH:  1. Sarcoidosis: Pulmonary and cutaneous.  CT chest in 4/13 showed hilar and mediastinal  lymphadenopathy but no significant parenchymal involvement.  PFTs (4/13) showed mild restriction.  Patient follows with Dr. Shelle Iron. Suspect cardiac involvement with non-CAD-related LV aneurysm.  PFTs (11/15): FVC 58%, FEV1 59%, ration 101%, TLC 57%, DLCO 53% (moderate-severe obstruction, severe parenchymal restriction).  CT chest (12/15) with bilateral hilar and mediastinal lymphadenopathy, pleural-based nodules.  - CT chest (1/20): mediastinal, hilar, and pleuroparenchymal signs of sarcoidosis, bibasilar bronchiectasis, emphysema.  2. HTN 3. OA left hip s/p THR in 2017.   4. 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  Right frontal infarct with left arm weakness.  - Carotid dopplers (4/22): <50% right CCA stenosis.  5. Contrast allergy 6. Cardiomyopathy: Admitted in 4/13 with CP.  Myoview showed EF 47% with anterior ischemia. Echo showed EF 65%, apex poorly visualized.  LHC (4/13) showed no significant coronary disease but there was a small LV apical aneurysm. LV aneurysms have been seen with cardiac sarcoidosis, and I suspect this is the cause.  We planned an MRI to evaluate but patient was too claustrophobic and could not do the study.  Lexiscan Cardiolite (7/14) with EF 47%, no ischemia or infarction.  Echo (7/14) with EF 55-60%, mild MR, PA systolic pressure 33 mmHg, normal RV size and systolic function, LV apical aneurysm was not visualized on this echo.  Echo (10/15) with EF 55-60%, mild LVH, LV apical aneurysm was not visualized.  - Echo (1/17) with EF 55%, unable to visualize apical aneurysm. - Cardiolite (1/17) with small, partially reversible  apical defect, EF 53%, low risk.  - Echo (10/20): EF 55-60%, mild LVH, normal RV, normal IVC.  - Echo (4/22): EF 50% with LV apical aneurysm, normal RV.  - Echo (3/24): EF 50-55% with apical aneurysm, moderate LVH, normal RV 7. PVCs: Holter in 9/20 showed 7.5% PVCs.  - Zio monitor (7/23): 5.9% PVCs, no atrial fibrillation.   FH: Sister with multiple sclerosis.    SH: Married, nonsmoker, unemployed (used to work in [employer]).  H/o cocaine abuse.   ROS: All systems reviewed and negative except as per HPI.   Current Outpatient Medications  Medication Sig Dispense Refill   carvedilol (COREG) 25 MG tablet Take 1 tablet (25 mg total) by mouth 2 (two) times daily with a meal. 60 tablet 0   hydrochlorothiazide (HYDRODIURIL) 25 MG tablet Take 1 tablet (25 mg total) by mouth daily. 30 tablet 0   lisinopril (ZESTRIL) 30 MG tablet TAKE 1 TABLET BY MOUTH ONCE DAILY. PLEASE CALL 60 tablet 0   rivaroxaban (XARELTO) 20 MG TABS tablet Take 1 tablet (20 mg total) by mouth daily with supper. 30 tablet 11   rosuvastatin (CRESTOR) 40 MG tablet Take 1 tablet (40 mg total) by mouth daily. 90 tablet 3   No current facility-administered medications for this encounter.   Wt Readings from Last 3 Encounters:  08/08/23 131.1 kg (289 lb)  01/15/23 133.9 kg (295 lb 3.2 oz)  03/05/22 132.7 kg (292 lb 9.6 oz)    BP (!) 142/94   Pulse 61   Wt 131.1 kg (289 lb)   SpO2 97%   BMI 34.72 kg/m  Physical Exam General:  NAD. No resp difficulty, walked into clinic HEENT: Normal Neck: Supple. No JVD. Carotids 2+ bilat; no bruits. No lymphadenopathy or thryomegaly appreciated. Cor: PMI nondisplaced. Regular rate & rhythm. No rubs, gallops or murmurs. Lungs: Clear Abdomen: Soft, nontender, nondistended. No hepatosplenomegaly. No bruits or masses. Good bowel sounds. Extremities: No cyanosis, clubbing, rash, edema Neuro: Alert & oriented x 3, cranial nerves grossly intact. Moves all 4 extremities w/o difficulty. Affect pleasant.  Assessment/Plan: 1. Cardiomyopathy: Patient has been noted in the past to have a small LV apical aneurysm.  He was unable to do a cardiac MRI due to claustrophobia.  He had no coronary disease on angiography in the past.  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  LV aneurysm  formation has been described with cardiac sarcoidosis.  Last echo in 4/22 showed EF 50%, LV apical aneurysm. Echo 3/24 showed EF 50-55%, moderate LVH, normal RV.  - We have attempted to arrange for cardiac PET to assess for evidence for active inflammation suggesting active cardiac sarcoidosis, however he had adamantly declined. He had a traumatic experience in the past with IV contrast and subsequent allergic reaction. I explained this injection for the PET was different substance, however he continues to refuse. I also offered Valium and pre-scan medications, again declines.  2. Hyperlipidemia: He is now on Crestor.   3. Sarcoidosis: There was evidence for pulmonary sarcoidosis on CT in 12/15 and again on CT in 1/20.   - He needs followup with pulmonary and has been referred but has refused to schedule and would like to think about it. 4. 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  7.5% PVCs on 9/20 holter. 5.9% PVCs on 4/23.  - Continue Coreg 25 mg bid 5. CVA: Patient had remote CVA and CVA again in 4/22.  Suspected  cause is cardioembolic from LV apical aneurysm.  He had been on warfarin but INR subtherapeutic. Now on Xarelto.   - Continue Xarelto.  6. HTN: BP elevated.  - Advised we increase lisinopril to 40 mg daily. He declines. BMET today. - Continue to check BP at home.  Follow up in 4 months with Dr. Kathreen Cornfield [facility] FNP-BC 08/08/2023

## 2023-08-08 NOTE — Patient Instructions (Signed)
Medication Changes:  No Changes In Medications at this time.   Lab Work:  Labs done today, your results will be available in MyChart, we will contact you for abnormal readings.  Follow-Up in: 4 months as scheduled with Dr. Shirlee Latch   At the [facility], you and your health needs are our priority. We have a designated team specialized in the treatment of Heart Failure. This Care Team includes your primary Heart Failure Specialized Cardiologist (physician), Advanced Practice Providers (APPs- Physician Assistants and Nurse Practitioners), and Pharmacist who all work together to provide you with the care you need, when you need it.   You may see any of the following providers on your designated Care Team at your next follow up:  Dr. Arvilla Meres Dr. [name] Dr. Marcos Eke, NP Robbie Lis, [place] [facility] [place], [place] Brynda Peon, NP Karle Plumber, PharmD  Please be sure to bring in all your medications bottles to every appointment.   Need to Contact us:  If you have any questions or concerns before your next appointment please send [place] a message through [place] or call our office at [phone].    TO LEAVE A MESSAGE FOR THE NURSE SELECT OPTION 2, PLEASE LEAVE A MESSAGE INCLUDING: YOUR NAME DATE OF BIRTH CALL BACK NUMBER REASON FOR CALL**this is important as we prioritize the call backs  YOU WILL RECEIVE A CALL BACK THE SAME DAY AS LONG AS YOU CALL BEFORE 4:00 PM

## 2023-11-24 ENCOUNTER — Encounter (HOSPITAL_COMMUNITY): Payer: Medicaid Other | Admitting: Cardiology

## 2023-12-30 ENCOUNTER — Other Ambulatory Visit (HOSPITAL_COMMUNITY): Payer: Self-pay | Admitting: Cardiology

## 2023-12-30 MED ORDER — LISINOPRIL 30 MG PO TABS: 30.0000 mg | ORAL_TABLET | Freq: Every day | ORAL | 0 refills | Status: DC

## 2024-04-03 ENCOUNTER — Other Ambulatory Visit (HOSPITAL_COMMUNITY): Payer: Self-pay | Admitting: Cardiology

## 2024-04-05 ENCOUNTER — Telehealth (HOSPITAL_COMMUNITY): Payer: Self-pay | Admitting: Cardiology

## 2024-04-05 NOTE — Telephone Encounter (Signed)
 Front office called patient to schedule a f/u appt with Dr. Mitzie Anda [facility]. Patient is overdue for a f/u appt in the clinic per Alfredia Ina, CMA. Patient stated that he is is busy right now and will call back to schedule a f/u appt.

## 2024-05-24 ENCOUNTER — Other Ambulatory Visit (HOSPITAL_COMMUNITY): Payer: Self-pay | Admitting: Cardiology

## 2024-06-07 ENCOUNTER — Other Ambulatory Visit (HOSPITAL_COMMUNITY): Payer: Self-pay

## 2024-06-17 ENCOUNTER — Other Ambulatory Visit (HOSPITAL_COMMUNITY): Payer: Self-pay | Admitting: Cardiology

## 2024-07-06 ENCOUNTER — Encounter (HOSPITAL_COMMUNITY)

## 2024-07-20 NOTE — Progress Notes (Signed)
 ADVANCED HF CLINIC NOTE   Patient ID: Tony Howard, male   DOB: 11/09/1961, 63 y.o.   MRN: [id number] PCP: Dr. Ilah Cardiology: Dr. Rolan  63 y.o. with sarcoidosis, HTN, and LV apical aneurysm on coumadin  due to prior CVA presents for cardiology followup.  Last echo in 10/20 showed EF 55-60%, unable to visualize apical aneurysm. Last CT chest in 1/20 showed hilar, mediastinal, and pleuroparenchymal signs of sarcoidosis.  Cardiolite  in 1/17 showed small, partially reversible apical defect, EF 53%, low risk.  He was not able to do a cardiac MRI due to claustrophobia.   Sarcoidosis seems to have been quiescent recently. He has never had definitively proven cardiac sarcoidosis but apical aneurysm in absence of coronary disease was suggestive.    Patient had a CVA in 4/22 that was treated with tPA.  He had a right frontal infarct with left arm weakness.  Echo at the time showed EF 50% with LV apical aneurysm, normal RV.  INR was subtherapeutic.    Zio monitor in 4/23 showed 5.9% PVCs, no atrial fibrillation.    Echo 3/24 showed EF 50-55%, moderate LVH, normal RV  Today he returns for AHF follow up after not being seen for 1 year. Overall feeling ok just complaining of joint pain. Denies palpitations, CP, edema, or PND/Orthopnea. Dizziness with brisk movements. SOB with exertion, tries to walk twice a week. Appetite ok. No fever or chills. Does not weigh at home, as he does not have a scale. Taking all medications however he sometimes misses doses as they are 30 day refills and he doesn't like going every 30 days. Never started Crestor . Drinks liquor once every 2 weeks.   ECG: NSR 68 bpm (Personally reviewed)    PMH:  1. Sarcoidosis: Pulmonary and cutaneous.  CT chest in 4/13 showed hilar and mediastinal lymphadenopathy but no significant parenchymal involvement.  PFTs (4/13) showed mild restriction.  Patient follows with Dr. Corrie. Suspect cardiac involvement with non-CAD-related LV aneurysm.   PFTs (11/15): FVC 58%, FEV1 59%, ration 101%, TLC 57%, DLCO 53% (moderate-severe obstruction, severe parenchymal restriction).  CT chest (12/15) with bilateral hilar and mediastinal lymphadenopathy, pleural-based nodules.  - CT chest (1/20): mediastinal, hilar, and pleuroparenchymal signs of sarcoidosis, bibasilar bronchiectasis, emphysema.  2. HTN 3. OA left hip s/p THR in 2017.   4. 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  Right frontal infarct with left arm weakness.  - Carotid dopplers (4/22): <50% right CCA stenosis.  5. Contrast allergy 6. Cardiomyopathy: Admitted in 4/13 with CP.  Myoview  showed EF 47% with anterior ischemia. Echo showed EF 65%, apex poorly visualized.  LHC (4/13) showed no significant coronary disease but there was a small LV apical aneurysm. LV aneurysms have been seen with cardiac sarcoidosis, and I suspect this is the cause.  We planned an MRI to evaluate but patient was too claustrophobic and could not do the study.  Lexiscan  Cardiolite  (7/14) with EF 47%, no ischemia or infarction.  Echo (7/14) with EF 55-60%, mild MR, PA systolic pressure 33 mmHg, normal RV size and systolic function, LV apical aneurysm was not visualized on this echo.  Echo (10/15) with EF 55-60%, mild LVH, LV apical aneurysm was not visualized.  - Echo (1/17) with EF 55%, unable to visualize apical aneurysm. - Cardiolite  (1/17) with small, partially reversible apical defect, EF 53%, low risk.  - Echo (10/20): EF 55-60%, mild LVH, normal RV, normal IVC.  - Echo (4/22): EF 50% with LV apical aneurysm, normal RV.  -  Echo (3/24): EF 50-55% with apical aneurysm, moderate LVH, normal RV 7. PVCs: Holter in 9/20 showed 7.5% PVCs.  - Zio monitor (7/23): 5.9% PVCs, no atrial fibrillation.   FH: Sister with multiple sclerosis.   SH: Married, nonsmoker, unemployed (used to work in [employer]).  H/o cocaine abuse.   ROS: All systems reviewed and negative except as per HPI.   Current Outpatient Medications   Medication Sig Dispense Refill   carvedilol  (COREG ) 25 MG tablet TAKE 1 TABLET BY MOUTH TWICE DAILY WITH A MEAL 180 tablet 3   hydrochlorothiazide  (HYDRODIURIL ) 25 MG tablet Take 1 tablet by mouth once daily 30 tablet 0   lisinopril  (ZESTRIL ) 30 MG tablet TAKE 1 TABLET BY MOUTH ONCE DAILY. LAST REFILL WITHOUT OFFICE VISIT 30 tablet 0   rivaroxaban  (XARELTO ) 20 MG TABS tablet Take 1 tablet (20 mg total) by mouth daily with supper. 30 tablet 11   rosuvastatin  (CRESTOR ) 40 MG tablet Take 1 tablet (40 mg total) by mouth daily. 90 tablet 3   No current facility-administered medications for this encounter.   Wt Readings from Last 3 Encounters:  08/03/24 129 kg (284 lb 6.4 oz)  08/08/23 131.1 kg (289 lb)  01/15/23 133.9 kg (295 lb 3.2 oz)    BP (!) 146/102   Pulse 66   Ht 6' 5 (1.956 m)   Wt 129 kg (284 lb 6.4 oz)   SpO2 95%   BMI 33.72 kg/m  Physical Exam General:  well appearing.  No respiratory difficulty. Walked in with cane.  Neck: JVD ~8 cm.  Cor: Regular rate & rhythm. No murmurs. Lungs: clear Extremities: +1 ankle edema  Neuro: alert & oriented x 3. Affect pleasant.   Wt Readings from Last 3 Encounters:  08/03/24 129 kg (284 lb 6.4 oz)  08/08/23 131.1 kg (289 lb)  01/15/23 133.9 kg (295 lb 3.2 oz)    Assessment/Plan: 1. Cardiomyopathy: Patient has been noted in the past to have a small LV apical aneurysm.  He was unable to do a cardiac MRI due to claustrophobia.  He had no coronary disease on angiography in the past. 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  LV aneurysm formation has been described with cardiac sarcoidosis.  Echo 4/22: EF 50%, LV apical aneurysm. Echo 3/24: EF 50-55%, moderate LVH, normal RV.  - We have attempted to arrange for cardiac PET to assess for evidence for active inflammation suggesting active cardiac sarcoidosis, however he had adamantly declined. He had a traumatic experience in  the past with IV contrast and subsequent allergic reaction. We explained the injection for the PET was a different substance. Also offered Valium and pre-scan medications, again declines. Discussed again today, seems more open to it but would like to follow up with pulmonology first.  - Update echo at follow up.  - He has a little bit of fluid on him today. Will give him 40 lasix  to take today. Then he can start 20 mg daily PRN for weight gain >3lbs overnight or >5lbs in 1 week.  - CMET, BNP today.   2. Hyperlipidemia: LDL 137 2/24. Update. He never started Crestor . Start Atorvastatin  80 mg daily. Repeat lipids in ~2-3 months. May need referral to lipid clinic.   3. Sarcoidosis: There was evidence for pulmonary sarcoidosis on CT in 12/15 and again on CT in 1/20.   - Discussed referral to pulmonology today, agreed to see them for further w/u. Referral placed.  4. 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  7.5% PVCs on 9/20 holter. 5.9% PVCs on 4/23.  - Continue Coreg  25 mg bid - No PVCs on EKG today.   5. CVA: Patient had remote CVA and CVA again in 4/22.  Suspected cause is cardioembolic from LV apical aneurysm.  He had been on warfarin but INR subtherapeutic. On Xarelto .   - Continue Xarelto . Denies abnormal bleeding. CBC today.   6. HTN: BP elevated.  - Advised we increase lisinopril  to 40 mg daily. He declines again today as he has intt dizziness.   Scale gifted today.   Follow up in 2-3 months with Dr. Rolan + echo.   Beckey LITTIE Coe AGACNP-BC  08/03/2024

## 2024-08-02 ENCOUNTER — Telehealth (HOSPITAL_COMMUNITY): Payer: Self-pay

## 2024-08-02 NOTE — Telephone Encounter (Signed)
 Called to confirm/remind patient of their appointment at the [facility] on 08/03/24 9:00.   Appointment:   [x] Confirmed  [] Left mess   [] No answer/No voice mail  [] VM Full/unable to leave message  [] Phone not in service  Patient reminded to bring all medications and/or complete list.  Confirmed patient has transportation. Gave directions, instructed to utilize valet parking.

## 2024-08-03 ENCOUNTER — Ambulatory Visit (HOSPITAL_COMMUNITY)
Admission: RE | Admit: 2024-08-03 | Discharge: 2024-08-03 | Disposition: A | Discharge status: Home / Self Care | Source: Ambulatory Visit | Attending: Internal Medicine | Admitting: Internal Medicine

## 2024-08-03 ENCOUNTER — Encounter (HOSPITAL_COMMUNITY): Payer: Self-pay

## 2024-08-03 ENCOUNTER — Ambulatory Visit (HOSPITAL_COMMUNITY): Payer: Self-pay | Admitting: Internal Medicine

## 2024-08-03 VITALS — BP 146/102 | HR 66 | Ht 77.0 in | Wt 284.4 lb

## 2024-08-03 DIAGNOSIS — I493 Ventricular premature depolarization: Secondary | ICD-10-CM | POA: Diagnosis not present

## 2024-08-03 DIAGNOSIS — I429 Cardiomyopathy, unspecified: Secondary | ICD-10-CM | POA: Diagnosis not present

## 2024-08-03 DIAGNOSIS — Z8673 Personal history of transient ischemic attack (TIA), and cerebral infarction without residual deficits: Secondary | ICD-10-CM | POA: Insufficient documentation

## 2024-08-03 DIAGNOSIS — E785 Hyperlipidemia, unspecified: Secondary | ICD-10-CM | POA: Diagnosis not present

## 2024-08-03 DIAGNOSIS — I10 Essential (primary) hypertension: Secondary | ICD-10-CM | POA: Insufficient documentation

## 2024-08-03 DIAGNOSIS — D860 Sarcoidosis of lung: Secondary | ICD-10-CM | POA: Diagnosis not present

## 2024-08-03 DIAGNOSIS — I253 Aneurysm of heart: Secondary | ICD-10-CM | POA: Diagnosis not present

## 2024-08-03 DIAGNOSIS — Z7901 Long term (current) use of anticoagulants: Secondary | ICD-10-CM | POA: Insufficient documentation

## 2024-08-03 DIAGNOSIS — I639 Cerebral infarction, unspecified: Secondary | ICD-10-CM

## 2024-08-03 DIAGNOSIS — D869 Sarcoidosis, unspecified: Secondary | ICD-10-CM | POA: Diagnosis not present

## 2024-08-03 LAB — CBC
HCT: 46.8 % (ref 39.0–52.0)
Hemoglobin: 15.5 g/dL (ref 13.0–17.0)
MCH: 30.5 pg (ref 26.0–34.0)
MCHC: 33.1 g/dL (ref 30.0–36.0)
MCV: 92.1 fL (ref 80.0–100.0)
Platelets: 173 K/uL (ref 150–400)
RBC: 5.08 MIL/uL (ref 4.22–5.81)
RDW: 12.1 % (ref 11.5–15.5)
WBC: 5.2 K/uL (ref 4.0–10.5)
nRBC: 0 % (ref 0.0–0.2)

## 2024-08-03 LAB — COMPREHENSIVE METABOLIC PANEL WITH GFR
ALT: 21 U/L (ref 0–44)
AST: 19 U/L (ref 15–41)
Albumin: 2.9 g/dL — ABNORMAL LOW (ref 3.5–5.0)
Alkaline Phosphatase: 66 U/L (ref 38–126)
Anion gap: 7 (ref 5–15)
BUN: 17 mg/dL (ref 8–23)
CO2: 24 mmol/L (ref 22–32)
Calcium: 9.5 mg/dL (ref 8.9–10.3)
Chloride: 105 mmol/L (ref 98–111)
Creatinine, Ser: 1.2 mg/dL (ref 0.61–1.24)
GFR, Estimated: >60 mL/min (ref >60)
Glucose, Bld: 92 mg/dL (ref 70–99)
Potassium: 3.9 mmol/L (ref 3.5–5.1)
Sodium: 136 mmol/L (ref 135–145)
Total Bilirubin: 0.7 mg/dL (ref 0.0–1.2)
Total Protein: 6.9 g/dL (ref 6.5–8.1)

## 2024-08-03 LAB — LIPID PANEL
Cholesterol: 199 mg/dL (ref 0–200)
HDL: 47 mg/dL (ref >40)
LDL Cholesterol: 134 mg/dL — ABNORMAL HIGH (ref 0–99)
Total CHOL/HDL Ratio: 4.2 ratio
Triglycerides: 89 mg/dL (ref <150)
VLDL: 18 mg/dL (ref 0–40)

## 2024-08-03 LAB — BRAIN NATRIURETIC PEPTIDE: B Natriuretic Peptide: 23 pg/mL (ref 0.0–100.0)

## 2024-08-03 MED ORDER — RIVAROXABAN 20 MG PO TABS: 20.0000 mg | ORAL_TABLET | Freq: Every day | ORAL | 3 refills | Status: AC

## 2024-08-03 MED ORDER — CARVEDILOL 25 MG PO TABS: 25.0000 mg | ORAL_TABLET | Freq: Two times a day (BID) | ORAL | 3 refills | Status: AC

## 2024-08-03 MED ORDER — ATORVASTATIN CALCIUM 80 MG PO TABS: 80.0000 mg | ORAL_TABLET | Freq: Every day | ORAL | 3 refills | Status: AC

## 2024-08-03 MED ORDER — HYDROCHLOROTHIAZIDE 25 MG PO TABS: 25.0000 mg | ORAL_TABLET | Freq: Every day | ORAL | 3 refills | Status: AC

## 2024-08-03 MED ORDER — LISINOPRIL 30 MG PO TABS: 30.0000 mg | ORAL_TABLET | Freq: Every day | ORAL | 3 refills | Status: AC

## 2024-08-03 MED ORDER — FUROSEMIDE 20 MG PO TABS: 20.0000 mg | ORAL_TABLET | Freq: Every day | ORAL | 3 refills | Status: AC | PRN

## 2024-08-03 NOTE — Patient Instructions (Addendum)
 Medication Changes:  STOP ROSUVASTATIN    START ATORVASTATIN  80MG  ONCE DAILY   START LASIX  (FUROSEMIDE ) 20MG  ONCE DAILY AS NEEDED FOR SWELLING OR WEIGHT GAIN ON 3 POUNDS OVERNIGHT OR 5 POUNDS IN 1 WEEK   TAKE LASIX  (FUROSEMIDE ) 40MG  ONCE TODAY  MEDICATIONS REFILLED AND SENT TO [facility]   Lab Work:  Labs done today, your results will be available in MyChart, we will contact you for abnormal readings.  Referrals:  YOU HAVE BEEN REFERRED TO PULMONOLOGY THEY WILL REACH OUT TO YOU OR CALL TO ARRANGE THIS. PLEASE CALL US  WITH ANY CONCERNS   Follow-Up in: 2-3 MONTHS WITH DR. ROLAN WITH ECHO  PLEASE CALL OUR OFFICE AROUND MID SEPTEMBER TO GET SCHEDULED FOR YOUR APPOINTMENT. PHONE NUMBER IS [phone] OPTION 2   At the [facility], you and your health needs are our priority. We have a designated team specialized in the treatment of Heart Failure. This Care Team includes your primary Heart Failure Specialized Cardiologist (physician), Advanced Practice Providers (APPs- Physician Assistants and Nurse Practitioners), and Pharmacist who all work together to provide you with the care you need, when you need it.   You may see any of the following providers on your designated Care Team at your next follow up:  Dr. Toribio Fuel Dr. Ezra ROLAN Dr. Ria Commander Dr. Odis Brownie Greig Mosses, NP Caffie Shed, [place] [facility], [place] Beckey Coe, NP [place] Lee, NP Tinnie Redman, PharmD   Please be sure to bring in all your medications bottles to every appointment.   Need to Contact Us :  If you have any questions or concerns before your next appointment please send us  a message through [place] or call our office at [phone].    TO LEAVE A MESSAGE FOR THE NURSE SELECT OPTION 2, PLEASE LEAVE A MESSAGE INCLUDING: YOUR NAME DATE OF BIRTH CALL BACK NUMBER REASON FOR CALL**this is important as we prioritize the call backs  YOU WILL RECEIVE  A CALL BACK THE SAME DAY AS LONG AS YOU CALL BEFORE 4:00 PM

## 2024-09-07 NOTE — Progress Notes (Signed)
 New Patient Pulmonology Office Visit   Subjective:  Patient ID: Tony Howard, male    DOB: [birth year]  MRN: [id number]  Referred by: Hayes Beckey CROME, NP  CC: No chief complaint on file.   HPI Tony Howard is a 63 y.o. 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  y. He has never had definitively proven cardiac sarcoidosis but apical aneurysm in absence of coronary disease was suggestive.  Patient had a CVA in 4/22 that was treated with tPA. He had a right frontal infarct with left arm weakness. Echo at the time showed EF 50% with LV apical aneurysm, normal RV. INR was subtherapeutic.   SOB with exertion, tries to walk twice a week   . Sarcoidosis: Pulmonary and cutaneous.  CT chest in 4/13 showed hilar and mediastinal lymphadenopathy but no significant parenchymal involvement.  PFTs (4/13) showed mild restriction.  Patient follows with Dr. Corrie. Suspect cardiac involvement with non-CAD-related LV aneurysm.  PFTs (11/15): FVC 58%, FEV1 59%, ration 101%, TLC 57%, DLCO 53% (moderate-severe obstruction, severe parenchymal restriction).  CT chest (12/15) with bilateral hilar and mediastinal lymphadenopathy, pleural-based nodules.  - CT chest (1/20): mediastinal, hilar, and pleuroparenchymal signs of sarcoidosis, bibasilar bronchiectasis, emphysema.   6. Cardiomyopathy: Admitted in 4/13 with CP. Myoview  showed EF 47% with anterior ischemia. Echo showed EF 65%, apex poorly visualized. LHC (4/13) showed no significant coronary disease but there was a small LV apical aneurysm. LV aneurysms have been seen with cardiac sarcoidosis, and I suspect this is the cause. We planned an MRI to evaluate but patient was too claustrophobic and could not do the study.   ROS  Allergies: Iodine , Naprosyn  [naproxen ], and Shellfish allergy  Current Outpatient Medications:    atorvastatin  (LIPITOR) 80 MG tablet, Take 1 tablet  (80 mg total) by mouth daily., Disp: 90 tablet, Rfl: 3   carvedilol  (COREG ) 25 MG tablet, Take 1 tablet (25 mg total) by mouth 2 (two) times daily with a meal., Disp: 180 tablet, Rfl: 3   furosemide  (LASIX ) 20 MG tablet, Take 1 tablet (20 mg total) by mouth daily as needed (FOR SWELLING OR WEIGHT GAIN OF 3 POUNDS OVERNIGHT OR 5 POUNDS IN 1 WEEK)., Disp: 30 tablet, Rfl: 3   hydrochlorothiazide  (HYDRODIURIL ) 25 MG tablet, Take 1 tablet (25 mg total) by mouth daily., Disp: 90 tablet, Rfl: 3   lisinopril  (ZESTRIL ) 30 MG tablet, Take 1 tablet (30 mg total) by mouth daily., Disp: 90 tablet, Rfl: 3   rivaroxaban  (XARELTO ) 20 MG TABS tablet, Take 1 tablet (20 mg total) by mouth daily with supper., Disp: 90 tablet, Rfl: 3 Past Medical History:  Diagnosis Date   Cardiomyopathy (HCC) 02/09/2019   +LV Apical Anuerysm >> Coumadin  Rx (hx of CVA) // Admx with CP in 4/13 - Myoview  with ant ischemia and EF 47; LHC with normal Cors but small LV apical aneurysm (?related to cardiac sarcoid) // Pt unable to do MRI due to claustrophobia // Echo 12/2015-EF 55-60, no RWMA, mild LAE; LV aneurysm not visualized on this study // Myoview  1/17: Low Risk, mild apical ischemia, inf basal thinning, EF 53    Chronic pain    Cutaneous sarcoidosis    DJD (degenerative joint disease)    Echocardiogram    Echocardiogram 09/2019: EF 55-60, mild LVH, impaired relaxation (Gr 1 DD), trace MR, trivial TR (LV apical aneurysm not noted on this study)   Family  history of adverse reaction to anesthesia    pts mother and pts sister had difficulty with breathing    History of kidney stones    History of stroke    Head CT showing encephalomalacia c/w old infarct in the distribution of the right PCA in April 2013   Hypertension    echo 4/13:  mild LVH, EF 65-70%, grade 1 diast dysfxn, mild LAE   Left sided numbness    LV Apical Aneurysm    Dx on LHC in 2013 >> Coumadin  Rx   Osteoarthritis    PVCs (premature ventricular contractions)     Zio Monitor 03/2019: normal sinus rhythm, 7.5% PVCs, no NSVT, no AFib   Sarcoidosis of lung    Swallowing difficulty    Past Surgical History:  Procedure Laterality Date   CYSTOSCOPY W/ URETERAL STENT PLACEMENT     KNEE SURGERY     right / childhood    LEFT HEART CATHETERIZATION WITH CORONARY ANGIOGRAM N/A 03/13/2012   Procedure: LEFT HEART CATHETERIZATION WITH CORONARY ANGIOGRAM;  Surgeon: Aleene JINNY Passe, MD;  Location: [facility] C[facility] LAB;  Service: Cardiovascular;  Laterality: N/A;  Contrast allergy   TOTAL HIP ARTHROPLASTY Left 11/07/2016   Procedure: LEFT TOTAL HIP ARTHROPLASTY ANTERIOR APPROACH;  Surgeon: Redell Shoals, MD;  Location: [facility] ORS;  Service: Orthopedics;  Laterality: Left;  Needs RNFA   WISDOM TOOTH EXTRACTION     4   Family History  Problem Relation Age of Onset   Lung cancer Mother    Multiple sclerosis Sister    Social History   Socioeconomic History   Marital status: Married    Spouse name: Not on file   Number of children: 1   Years of education: Not on file   Highest education level: Not on file  Occupational History    Employer: [employer]  Tobacco Use   Smoking status: Never   Smokeless tobacco: Never  Vaping Use   Vaping status: Never Used  Substance and Sexual Activity   Alcohol  use: Yes    Comment: occas    Drug use: No   Sexual activity: Yes    Birth control/protection: None  Other Topics Concern   Not on file  Social History Narrative   Not on file   Social Drivers of Health   Financial Resource Strain: Not on file  Food Insecurity: Not on file  Transportation Needs: No Transportation Needs (03/15/2021)   Received [facility]   PRAPARE - Transportation    In the past 12 months, has lack of transportation kept you from medical appointments or from getting medications?: No    Lack of Transportation (Non-Medical): No  Physical Activity: Not on file  Stress: Not on file  Social Connections: Not on file   Intimate Partner Violence: Not on file       Objective:  There were no vitals taken for this visit. {Pulm Vitals (Optional):32837}  Physical Exam  Diagnostic Review:  {Labs (Optional):32838}  CT chest 2020: Mediastinal, hilar and pleuroparenchymal findings of sarcoid. Minimal increase in bibasilar bronchiectasis, architectural distortion and volume loss. 2.  Aortic atherosclerosis (ICD10-170.0). 3. Enlarged pulmonary arteries, indicative of pulmonary arterial hypertension. 4.  Emphysema  CXR 01/2023 Midline trachea. Normal heart size and mediastinal contours. No pleural effusion or pneumothorax. Mild low lung volumes with bibasilar volume loss and scar/atelectasis.   PFTs    2015 FVC    2.8/58% FEV1  2.3/59% F/F      81% TLC  4.5/57% DLCO  20.6/53%  Cardiac images: Cardiolite  (1/17) with small, partially reversible apical defect, EF 53%, low risk.  - Echo (10/20): EF 55-60%, mild LVH, normal RV, normal IVC.  - Echo (4/22): EF 50% with LV apical aneurysm, normal RV.  - Echo (3/24): EF 50-55% with apical aneurysm, moderate LVH, normal RV 7. PVCs: Holter in 9/20 showed 7.5% PVCs.  - Zio monitor (7/23): 5.9% PVCs, no atrial fibrillation.     Assessment & Plan:   Cariodmyopathy - small LV apical aneurysm. Unable to perform MRI due to clautrophobia. Neg CAD on angiography in the past.  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  LV aneurysm formation has been described with cardiac sarcoidosis.  Echo 4/22: EF 50%, LV apical aneurysm. Echo 3/24: EF 50-55%, moderate LVH, normal RV.  - We have attempted to arrange for cardiac PET to assess for evidence for active inflammation suggesting active cardiac sarcoidosis, however he had adamantly declined. He had a traumatic experience in the past with IV contrast and subsequent allergic reaction. We explained the injection for the PET was a different substance.  Also offered Valium and pre-scan medications, again declines. Discussed again today, seems more open to it but would like to follow up with pulmonology first.   He has a little bit of fluid on him today. Will give him 40 lasix  to take today. Then he can start 20 mg daily PRN for weight gain >3lbs overnight or >5lbs in 1 week.   . Sarcoidosis: There was evidence for pulmonary sarcoidosis on CT in 12/15 and again on CT in 1/20.   - Discussed referral to pulmonology today, agreed to see them for further w/u. Referral placed.   Repeat CT chestt PFTs On any therapy/>  Assessment & Plan    No follow-ups on file.    Marny Patch, MD Pulmonary and Critical Care Medicine [facility]

## 2024-09-08 ENCOUNTER — Ambulatory Visit

## 2024-09-08 VITALS — BP 158/112 | HR 79 | Temp 98.1°F | Ht 76.0 in | Wt 284.4 lb

## 2024-09-08 DIAGNOSIS — I429 Cardiomyopathy, unspecified: Secondary | ICD-10-CM | POA: Diagnosis not present

## 2024-09-08 DIAGNOSIS — D869 Sarcoidosis, unspecified: Secondary | ICD-10-CM | POA: Diagnosis not present

## 2024-09-08 NOTE — Patient Instructions (Addendum)
 Dear Mr. Tony Howard; You have history of sarcoidosis for years without any treatment. It seems your respiratory symptoms are stable. I will do an initial evaluation with an imaging of your chest and pulmonary function test.  As sarcoidosis can attack different organs I will refer you to the eye doctor for yearly evaluation. I will see you in 6 months, but if you have respiratory symptoms we can get you an appointment sooner.

## 2024-09-16 ENCOUNTER — Ambulatory Visit (HOSPITAL_COMMUNITY)

## 2024-09-24 ENCOUNTER — Ambulatory Visit (HOSPITAL_COMMUNITY)
Admission: RE | Admit: 2024-09-24 | Discharge: 2024-09-24 | Disposition: A | Discharge status: Home / Self Care | Source: Ambulatory Visit

## 2024-09-24 ENCOUNTER — Other Ambulatory Visit (HOSPITAL_COMMUNITY)

## 2024-09-24 DIAGNOSIS — D869 Sarcoidosis, unspecified: Secondary | ICD-10-CM | POA: Insufficient documentation

## 2024-09-30 ENCOUNTER — Ambulatory Visit (INDEPENDENT_AMBULATORY_CARE_PROVIDER_SITE_OTHER): Admitting: *Deleted

## 2024-09-30 DIAGNOSIS — D869 Sarcoidosis, unspecified: Secondary | ICD-10-CM | POA: Diagnosis not present

## 2024-09-30 LAB — PULMONARY FUNCTION TEST
DL/VA % pred: 92 %
DL/VA: 3.82 ml/min/mmHg/L
DLCO cor % pred: 55 %
DLCO cor: 17.16 ml/min/mmHg
DLCO unc % pred: 55 %
DLCO unc: 17.16 ml/min/mmHg
FEF 25-75 Post: 2.16 L/s
FEF 25-75 Pre: 1.69 L/s
FEF2575-%Change-Post: 27 %
FEF2575-%Pred-Post: 65 %
FEF2575-%Pred-Pre: 51 %
FEV1-%Change-Post: 3 %
FEV1-%Pred-Post: 51 %
FEV1-%Pred-Pre: 49 %
FEV1-Post: 2.12 L
FEV1-Pre: 2.04 L
FEV1FVC-%Change-Post: 4 %
FEV1FVC-%Pred-Pre: 102 %
FEV6-%Change-Post: 0 %
FEV6-%Pred-Post: 50 %
FEV6-%Pred-Pre: 50 %
FEV6-Post: 2.64 L
FEV6-Pre: 2.62 L
FEV6FVC-%Change-Post: 1 %
FEV6FVC-%Pred-Post: 104 %
FEV6FVC-%Pred-Pre: 103 %
FVC-%Change-Post: 0 %
FVC-%Pred-Post: 48 %
FVC-%Pred-Pre: 48 %
FVC-Post: 2.64 L
FVC-Pre: 2.65 L
Post FEV1/FVC ratio: 80 %
Post FEV6/FVC ratio: 100 %
Pre FEV1/FVC ratio: 77 %
Pre FEV6/FVC Ratio: 99 %
RV % pred: 83 %
RV: 2.12 L
TLC % pred: 62 %
TLC: 4.96 L

## 2024-09-30 NOTE — Patient Instructions (Signed)
 Full PFT performed today.

## 2024-09-30 NOTE — Progress Notes (Signed)
 Full PFT performed today.

## 2024-10-07 ENCOUNTER — Telehealth: Payer: Self-pay

## 2024-10-07 NOTE — Telephone Encounter (Signed)
 I called patient 2 times today, without succes. I sent a printed letter with his test results.

## 2024-10-08 ENCOUNTER — Ambulatory Visit: Payer: Self-pay

## 2024-10-08 NOTE — Telephone Encounter (Signed)
 Pt is aware of results and verbalized understanding. NFN

## 2024-10-08 NOTE — Telephone Encounter (Signed)
 Called and spoke with the pt & relayed results. Nothing further needed.
